# Patient Record
Sex: Female | Born: 1950
Health system: Southern US, Community
[De-identification: ages and names within clinical notes are randomized; demographics above are authoritative.]

## PROBLEM LIST (undated history)

## (undated) DIAGNOSIS — M1990 Unspecified osteoarthritis, unspecified site: Secondary | ICD-10-CM

## (undated) DIAGNOSIS — Z960 Presence of urogenital implants: Secondary | ICD-10-CM

## (undated) DIAGNOSIS — Z8659 Personal history of other mental and behavioral disorders: Secondary | ICD-10-CM

## (undated) DIAGNOSIS — K219 Gastro-esophageal reflux disease without esophagitis: Secondary | ICD-10-CM

## (undated) DIAGNOSIS — K449 Diaphragmatic hernia without obstruction or gangrene: Secondary | ICD-10-CM

## (undated) DIAGNOSIS — I491 Atrial premature depolarization: Secondary | ICD-10-CM

## (undated) DIAGNOSIS — I493 Ventricular premature depolarization: Secondary | ICD-10-CM

## (undated) DIAGNOSIS — Z973 Presence of spectacles and contact lenses: Secondary | ICD-10-CM

## (undated) DIAGNOSIS — R42 Dizziness and giddiness: Secondary | ICD-10-CM

## (undated) DIAGNOSIS — E119 Type 2 diabetes mellitus without complications: Secondary | ICD-10-CM

## (undated) DIAGNOSIS — D509 Iron deficiency anemia, unspecified: Secondary | ICD-10-CM

## (undated) DIAGNOSIS — Z87898 Personal history of other specified conditions: Secondary | ICD-10-CM

## (undated) DIAGNOSIS — R0602 Shortness of breath: Secondary | ICD-10-CM

## (undated) DIAGNOSIS — K08109 Complete loss of teeth, unspecified cause, unspecified class: Secondary | ICD-10-CM

## (undated) DIAGNOSIS — O009 Unspecified ectopic pregnancy without intrauterine pregnancy: Secondary | ICD-10-CM

## (undated) DIAGNOSIS — Z972 Presence of dental prosthetic device (complete) (partial): Secondary | ICD-10-CM

## (undated) DIAGNOSIS — F419 Anxiety disorder, unspecified: Secondary | ICD-10-CM

## (undated) DIAGNOSIS — N816 Rectocele: Secondary | ICD-10-CM

## (undated) DIAGNOSIS — I10 Essential (primary) hypertension: Principal | ICD-10-CM

## (undated) DIAGNOSIS — G4733 Obstructive sleep apnea (adult) (pediatric): Secondary | ICD-10-CM

## (undated) HISTORY — DX: Atrial premature depolarization: I49.1

## (undated) HISTORY — PX: TUBAL LIGATION: SHX77

## (undated) HISTORY — PX: OTHER SURGICAL HISTORY: SHX169

## (undated) HISTORY — DX: Dizziness and giddiness: R42

## (undated) HISTORY — PX: EXPLORATORY LAPAROTOMY: SUR591

## (undated) HISTORY — DX: Personal history of other specified conditions: Z87.898

## (undated) HISTORY — PX: TOE SURGERY: SHX1073

## (undated) HISTORY — DX: Shortness of breath: R06.02

## (undated) HISTORY — DX: Ventricular premature depolarization: I49.3

## (undated) HISTORY — DX: Essential (primary) hypertension: I10

## (undated) HISTORY — PX: BUNIONECTOMY: SHX129

---

## 1989-11-19 DIAGNOSIS — Z8759 Personal history of other complications of pregnancy, childbirth and the puerperium: Secondary | ICD-10-CM

## 1989-11-19 HISTORY — PX: ECTOPIC PREGNANCY SURGERY: SHX613

## 1989-11-19 HISTORY — DX: Personal history of other complications of pregnancy, childbirth and the puerperium: Z87.59

## 1998-07-18 ENCOUNTER — Emergency Department (HOSPITAL_COMMUNITY): Admission: EM | Admit: 1998-07-18 | Discharge: 1998-07-18 | Payer: Self-pay | Admitting: Emergency Medicine

## 1998-08-09 ENCOUNTER — Encounter: Payer: Self-pay | Admitting: Emergency Medicine

## 1998-08-09 ENCOUNTER — Emergency Department (HOSPITAL_COMMUNITY): Admission: EM | Admit: 1998-08-09 | Discharge: 1998-08-09 | Payer: Self-pay | Admitting: Emergency Medicine

## 1998-11-05 ENCOUNTER — Emergency Department (HOSPITAL_COMMUNITY): Admission: EM | Admit: 1998-11-05 | Discharge: 1998-11-05 | Payer: Self-pay | Admitting: Emergency Medicine

## 2000-11-19 DIAGNOSIS — E119 Type 2 diabetes mellitus without complications: Secondary | ICD-10-CM | POA: Insufficient documentation

## 2001-10-10 ENCOUNTER — Emergency Department (HOSPITAL_COMMUNITY): Admission: EM | Admit: 2001-10-10 | Discharge: 2001-10-10 | Payer: Self-pay | Admitting: Emergency Medicine

## 2001-10-10 ENCOUNTER — Encounter: Payer: Self-pay | Admitting: *Deleted

## 2001-10-10 ENCOUNTER — Encounter: Payer: Self-pay | Admitting: Emergency Medicine

## 2002-03-23 ENCOUNTER — Ambulatory Visit (HOSPITAL_COMMUNITY): Admission: RE | Admit: 2002-03-23 | Discharge: 2002-03-23 | Payer: Self-pay | Admitting: Family Medicine

## 2002-03-23 ENCOUNTER — Encounter: Payer: Self-pay | Admitting: Obstetrics & Gynecology

## 2003-02-02 ENCOUNTER — Emergency Department (HOSPITAL_COMMUNITY): Admission: EM | Admit: 2003-02-02 | Discharge: 2003-02-02 | Payer: Self-pay | Admitting: Emergency Medicine

## 2003-02-02 ENCOUNTER — Encounter: Payer: Self-pay | Admitting: Emergency Medicine

## 2003-02-12 ENCOUNTER — Emergency Department (HOSPITAL_COMMUNITY): Admission: EM | Admit: 2003-02-12 | Discharge: 2003-02-12 | Payer: Self-pay | Admitting: Emergency Medicine

## 2003-10-07 ENCOUNTER — Emergency Department (HOSPITAL_COMMUNITY): Admission: EM | Admit: 2003-10-07 | Discharge: 2003-10-07 | Payer: Self-pay | Admitting: Emergency Medicine

## 2006-03-20 ENCOUNTER — Encounter: Payer: Self-pay | Admitting: Emergency Medicine

## 2007-03-01 ENCOUNTER — Emergency Department (HOSPITAL_COMMUNITY): Admission: EM | Admit: 2007-03-01 | Discharge: 2007-03-01 | Payer: Self-pay | Admitting: Emergency Medicine

## 2008-02-06 ENCOUNTER — Emergency Department (HOSPITAL_COMMUNITY): Admission: EM | Admit: 2008-02-06 | Discharge: 2008-02-07 | Payer: Self-pay | Admitting: Emergency Medicine

## 2008-02-07 ENCOUNTER — Emergency Department (HOSPITAL_COMMUNITY): Admission: EM | Admit: 2008-02-07 | Discharge: 2008-02-07 | Payer: Self-pay | Admitting: Emergency Medicine

## 2008-08-11 ENCOUNTER — Emergency Department (HOSPITAL_COMMUNITY): Admission: EM | Admit: 2008-08-11 | Discharge: 2008-08-11 | Payer: Self-pay | Admitting: Family Medicine

## 2009-09-17 ENCOUNTER — Emergency Department (HOSPITAL_COMMUNITY): Admission: EM | Admit: 2009-09-17 | Discharge: 2009-09-17 | Payer: Self-pay | Admitting: Emergency Medicine

## 2010-02-28 ENCOUNTER — Emergency Department (HOSPITAL_COMMUNITY): Admission: EM | Admit: 2010-02-28 | Discharge: 2010-02-28 | Payer: Self-pay | Admitting: Family Medicine

## 2010-03-14 ENCOUNTER — Ambulatory Visit: Payer: Self-pay | Admitting: Nurse Practitioner

## 2010-03-14 DIAGNOSIS — M25559 Pain in unspecified hip: Secondary | ICD-10-CM | POA: Insufficient documentation

## 2010-03-14 LAB — CONVERTED CEMR LAB
ALT: 11 units/L (ref 0–35)
AST: 13 units/L (ref 0–37)
Albumin: 4.8 g/dL (ref 3.5–5.2)
Alkaline Phosphatase: 71 units/L (ref 39–117)
BUN: 15 mg/dL (ref 6–23)
Basophils Absolute: 0 10*3/uL (ref 0.0–0.1)
Basophils Relative: 0 % (ref 0–1)
Bilirubin Urine: NEGATIVE
Blood Glucose, Fingerstick: 335
Blood in Urine, dipstick: NEGATIVE
CO2: 15 meq/L — ABNORMAL LOW (ref 19–32)
CRP: 0.7 mg/dL — ABNORMAL HIGH (ref <0.6)
Calcium: 10 mg/dL (ref 8.4–10.5)
Chloride: 102 meq/L (ref 96–112)
Creatinine, Ser: 0.83 mg/dL (ref 0.40–1.20)
Eosinophils Absolute: 0.1 10*3/uL (ref 0.0–0.7)
Eosinophils Relative: 2 % (ref 0–5)
Glucose, Bld: 225 mg/dL — ABNORMAL HIGH (ref 70–99)
Glucose, Urine, Semiquant: >=1000
HCT: 36.9 % (ref 36.0–46.0)
Hemoglobin: 12.1 g/dL (ref 12.0–15.0)
Insulin: 20 microintl units/mL (ref 3–28)
Ketones, urine, test strip: NEGATIVE
Lymphocytes Relative: 46 % (ref 12–46)
Lymphs Abs: 4.2 10*3/uL — ABNORMAL HIGH (ref 0.7–4.0)
MCHC: 32.8 g/dL (ref 30.0–36.0)
MCV: 86.4 fL (ref 78.0–100.0)
Microalb, Ur: 1.78 mg/dL (ref 0.00–1.89)
Monocytes Absolute: 0.4 10*3/uL (ref 0.1–1.0)
Monocytes Relative: 5 % (ref 3–12)
Neutro Abs: 4.3 10*3/uL (ref 1.7–7.7)
Neutrophils Relative %: 48 % (ref 43–77)
Nitrite: NEGATIVE
Platelets: 315 10*3/uL (ref 150–400)
Potassium: 4.5 meq/L (ref 3.5–5.3)
RBC: 4.27 M/uL (ref 3.87–5.11)
RDW: 13.9 % (ref 11.5–15.5)
Rapid HIV Screen: NEGATIVE
Sodium: 137 meq/L (ref 135–145)
Specific Gravity, Urine: >=1.03
TSH: 3.167 microintl units/mL (ref 0.350–4.500)
Total Bilirubin: 0.3 mg/dL (ref 0.3–1.2)
Total Protein: 8.2 g/dL (ref 6.0–8.3)
Urobilinogen, UA: 0.2
WBC Urine, dipstick: NEGATIVE
WBC: 9 10*3/uL (ref 4.0–10.5)
pH: 5.5

## 2010-03-20 ENCOUNTER — Ambulatory Visit (HOSPITAL_COMMUNITY): Admission: RE | Admit: 2010-03-20 | Discharge: 2010-03-20 | Payer: Self-pay | Admitting: Rheumatology

## 2010-03-20 ENCOUNTER — Ambulatory Visit: Payer: Self-pay | Admitting: Nurse Practitioner

## 2010-03-20 DIAGNOSIS — R109 Unspecified abdominal pain: Secondary | ICD-10-CM | POA: Insufficient documentation

## 2010-03-20 LAB — CONVERTED CEMR LAB
Bilirubin Urine: NEGATIVE
Blood Glucose, Fingerstick: 122
Blood in Urine, dipstick: NEGATIVE
Glucose, Urine, Semiquant: NEGATIVE
Nitrite: NEGATIVE
Specific Gravity, Urine: >=1.03
Urobilinogen, UA: 0.2
WBC Urine, dipstick: NEGATIVE
pH: 5

## 2010-03-21 ENCOUNTER — Encounter (INDEPENDENT_AMBULATORY_CARE_PROVIDER_SITE_OTHER): Payer: Self-pay | Admitting: Nurse Practitioner

## 2010-03-21 ENCOUNTER — Ambulatory Visit: Payer: Self-pay | Admitting: Internal Medicine

## 2010-03-22 DIAGNOSIS — K5900 Constipation, unspecified: Secondary | ICD-10-CM | POA: Insufficient documentation

## 2010-03-31 ENCOUNTER — Observation Stay (HOSPITAL_COMMUNITY): Admission: EM | Admit: 2010-03-31 | Discharge: 2010-03-31 | Payer: Self-pay | Admitting: Emergency Medicine

## 2010-03-31 ENCOUNTER — Ambulatory Visit: Payer: Self-pay | Admitting: Cardiovascular Disease

## 2010-03-31 ENCOUNTER — Encounter (INDEPENDENT_AMBULATORY_CARE_PROVIDER_SITE_OTHER): Payer: Self-pay | Admitting: Emergency Medicine

## 2010-04-06 ENCOUNTER — Ambulatory Visit: Payer: Self-pay | Admitting: Nurse Practitioner

## 2010-04-06 DIAGNOSIS — M21619 Bunion of unspecified foot: Secondary | ICD-10-CM | POA: Insufficient documentation

## 2010-04-06 LAB — CONVERTED CEMR LAB: Blood Glucose, Fingerstick: 188

## 2010-04-07 ENCOUNTER — Telehealth (INDEPENDENT_AMBULATORY_CARE_PROVIDER_SITE_OTHER): Payer: Self-pay | Admitting: Nurse Practitioner

## 2010-04-08 ENCOUNTER — Emergency Department (HOSPITAL_COMMUNITY): Admission: EM | Admit: 2010-04-08 | Discharge: 2010-04-09 | Payer: Self-pay | Admitting: Emergency Medicine

## 2010-04-10 ENCOUNTER — Encounter (INDEPENDENT_AMBULATORY_CARE_PROVIDER_SITE_OTHER): Payer: Self-pay | Admitting: Nurse Practitioner

## 2010-04-10 ENCOUNTER — Ambulatory Visit: Payer: Self-pay | Admitting: Internal Medicine

## 2010-05-17 ENCOUNTER — Telehealth (INDEPENDENT_AMBULATORY_CARE_PROVIDER_SITE_OTHER): Payer: Self-pay | Admitting: Nurse Practitioner

## 2010-07-05 ENCOUNTER — Telehealth (INDEPENDENT_AMBULATORY_CARE_PROVIDER_SITE_OTHER): Payer: Self-pay | Admitting: Nurse Practitioner

## 2010-07-05 ENCOUNTER — Ambulatory Visit: Payer: Self-pay | Admitting: Nurse Practitioner

## 2010-07-05 DIAGNOSIS — Z780 Asymptomatic menopausal state: Secondary | ICD-10-CM | POA: Insufficient documentation

## 2010-07-06 ENCOUNTER — Encounter (INDEPENDENT_AMBULATORY_CARE_PROVIDER_SITE_OTHER): Payer: Self-pay | Admitting: Nurse Practitioner

## 2010-07-06 DIAGNOSIS — E785 Hyperlipidemia, unspecified: Secondary | ICD-10-CM | POA: Insufficient documentation

## 2010-07-07 ENCOUNTER — Encounter (INDEPENDENT_AMBULATORY_CARE_PROVIDER_SITE_OTHER): Payer: Self-pay | Admitting: Nurse Practitioner

## 2010-07-12 ENCOUNTER — Encounter (INDEPENDENT_AMBULATORY_CARE_PROVIDER_SITE_OTHER): Payer: Self-pay | Admitting: Nurse Practitioner

## 2010-07-13 ENCOUNTER — Telehealth (INDEPENDENT_AMBULATORY_CARE_PROVIDER_SITE_OTHER): Payer: Self-pay | Admitting: Nurse Practitioner

## 2010-07-17 ENCOUNTER — Telehealth (INDEPENDENT_AMBULATORY_CARE_PROVIDER_SITE_OTHER): Payer: Self-pay | Admitting: Nurse Practitioner

## 2010-07-28 ENCOUNTER — Encounter (INDEPENDENT_AMBULATORY_CARE_PROVIDER_SITE_OTHER): Payer: Self-pay | Admitting: Nurse Practitioner

## 2010-07-28 ENCOUNTER — Ambulatory Visit (HOSPITAL_COMMUNITY)
Admission: RE | Admit: 2010-07-28 | Discharge: 2010-07-28 | Payer: Self-pay | Source: Home / Self Care | Admitting: Family Medicine

## 2010-08-28 ENCOUNTER — Encounter (INDEPENDENT_AMBULATORY_CARE_PROVIDER_SITE_OTHER): Payer: Self-pay | Admitting: Nurse Practitioner

## 2010-09-14 ENCOUNTER — Ambulatory Visit: Payer: Self-pay | Admitting: Nurse Practitioner

## 2010-09-14 LAB — CONVERTED CEMR LAB
ALT: 10 units/L (ref 0–35)
AST: 21 units/L (ref 0–37)
Albumin: 5 g/dL (ref 3.5–5.2)
Alkaline Phosphatase: 58 units/L (ref 39–117)
Bilirubin, Direct: 0.1 mg/dL (ref 0.0–0.3)
Cholesterol: 228 mg/dL — ABNORMAL HIGH (ref 0–200)
HDL: 44 mg/dL (ref >39)
Indirect Bilirubin: 0.4 mg/dL (ref 0.0–0.9)
LDL Cholesterol: 163 mg/dL — ABNORMAL HIGH (ref 0–99)
Total Bilirubin: 0.5 mg/dL (ref 0.3–1.2)
Total CHOL/HDL Ratio: 5.2
Total Protein: 8 g/dL (ref 6.0–8.3)
Triglycerides: 105 mg/dL (ref <150)
VLDL: 21 mg/dL (ref 0–40)

## 2010-09-16 ENCOUNTER — Telehealth (INDEPENDENT_AMBULATORY_CARE_PROVIDER_SITE_OTHER): Payer: Self-pay | Admitting: Nurse Practitioner

## 2010-09-16 ENCOUNTER — Encounter (INDEPENDENT_AMBULATORY_CARE_PROVIDER_SITE_OTHER): Payer: Self-pay | Admitting: Nurse Practitioner

## 2010-11-24 ENCOUNTER — Emergency Department (HOSPITAL_COMMUNITY)
Admission: EM | Admit: 2010-11-24 | Discharge: 2010-11-24 | Payer: Self-pay | Source: Home / Self Care | Admitting: Emergency Medicine

## 2010-11-24 LAB — URINALYSIS, ROUTINE W REFLEX MICROSCOPIC
Bilirubin Urine: NEGATIVE
Hemoglobin, Urine: NEGATIVE
Ketones, ur: NEGATIVE mg/dL
Nitrite: NEGATIVE
Protein, ur: NEGATIVE mg/dL
Specific Gravity, Urine: 1.015 (ref 1.005–1.030)
Urine Glucose, Fasting: NEGATIVE mg/dL
Urobilinogen, UA: 0.2 mg/dL (ref 0.0–1.0)
pH: 6.5 (ref 5.0–8.0)

## 2010-11-24 LAB — BASIC METABOLIC PANEL
BUN: 15 mg/dL (ref 6–23)
CO2: 23 mEq/L (ref 19–32)
Calcium: 9.3 mg/dL (ref 8.4–10.5)
Chloride: 107 mEq/L (ref 96–112)
Creatinine, Ser: 0.8 mg/dL (ref 0.4–1.2)
GFR calc Af Amer: >60 mL/min (ref >60)
GFR calc non Af Amer: >60 mL/min (ref >60)
Glucose, Bld: 151 mg/dL — ABNORMAL HIGH (ref 70–99)
Potassium: 3.8 mEq/L (ref 3.5–5.1)
Sodium: 140 mEq/L (ref 135–145)

## 2010-11-24 LAB — MAGNESIUM: Magnesium: 2 mg/dL (ref 1.5–2.5)

## 2010-11-24 LAB — CK: Total CK: 167 U/L (ref 7–177)

## 2010-11-24 LAB — GLUCOSE, CAPILLARY: Glucose-Capillary: 141 mg/dL — ABNORMAL HIGH (ref 70–99)

## 2010-11-24 LAB — CALCIUM, IONIZED: Calcium, Ion: 0.72 mmol/L — CL (ref 1.12–1.32)

## 2010-12-17 LAB — CONVERTED CEMR LAB
BUN: 13 mg/dL (ref 6–23)
Bilirubin Urine: NEGATIVE
Blood Glucose, Fingerstick: 219
Blood in Urine, dipstick: NEGATIVE
CO2: 26 meq/L (ref 19–32)
Calcium: 9.7 mg/dL (ref 8.4–10.5)
Chloride: 104 meq/L (ref 96–112)
Cholesterol: 258 mg/dL — ABNORMAL HIGH (ref 0–200)
Creatinine, Ser: 0.84 mg/dL (ref 0.40–1.20)
Glucose, Bld: 132 mg/dL — ABNORMAL HIGH (ref 70–99)
Glucose, Urine, Semiquant: 250
HDL: 38 mg/dL — ABNORMAL LOW (ref >39)
Hgb A1c MFr Bld: 8.1 %
Ketones, urine, test strip: NEGATIVE
LDL Cholesterol: 173 mg/dL — ABNORMAL HIGH (ref 0–99)
Nitrite: NEGATIVE
OCCULT 1: NEGATIVE
Pap Smear: NEGATIVE
Potassium: 4 meq/L (ref 3.5–5.3)
Protein, U semiquant: NEGATIVE
Sodium: 139 meq/L (ref 135–145)
Specific Gravity, Urine: >=1.03
Total CHOL/HDL Ratio: 6.8
Triglycerides: 237 mg/dL — ABNORMAL HIGH (ref <150)
Urobilinogen, UA: 0.2
VLDL: 47 mg/dL — ABNORMAL HIGH (ref 0–40)
WBC Urine, dipstick: NEGATIVE
pH: 5

## 2010-12-19 NOTE — Progress Notes (Signed)
Summary: Wanting f/u lab appt.   Phone Note Call from Patient   Caller: Patient Reason for Call: Talk to Doctor Summary of Call: PT WANTS TO KNOW . WHAT TIME  SHE CAN TAKE NEUROTIN . PLEASE, CALL HER @ [phone]  THAN K YOU  Initial call taken by: Cheryll Dessert,  July 13, 2010 11:13 AM  Follow-up for Phone Call        Pt. states that she started taking the pravastatin last night; appt. for f/u FLP and LFTs made for 09/08/10.  Denied query for neurontin, just wanted to make appt.  Follow-up by: Dutch Quint RN,  July 13, 2010 11:47 AM

## 2010-12-19 NOTE — Progress Notes (Signed)
Summary: Lab and Bone Density results  Phone Note Outgoing Call   Summary of Call: notify pt that her cholesterol was improved from previous check but still not at goal. will increase pravachol to 40mg  by mouth night (new Rx sent to [facility]) Also her bone density done in September shows that she has osteopenia (softening of bones) in left hip and lower back she should take calcium + vitamin D supplement twice daily (sent to [facility] pharmacy) she can also try glucosamine supplement twice per day (she can purchase this OTC)  There is some evidence that the glucoasamine will help some with joint pain Initial call taken by: Lehman Prom FNP,  September 16, 2010 4:59 PM  Follow-up for Phone Call        left message on machine for pt to return call to the office. Follow-up by: [name],  September 19, 2010 2:58 PM  Additional Follow-up for Phone Call Additional follow up Details #1::        pt informed of above information. Additional Follow-up by: [name],  September 20, 2010 9:00 AM    New/Updated Medications: PRAVASTATIN SODIUM 40 MG TABS (PRAVASTATIN SODIUM) One tablet by mouth nightly for cholesterol  *Note change in dose** CALCIUM-D 600-200 MG-UNIT TABS (CALCIUM CARBONATE-VITAMIN D) One tablet by mouth two times a day for bones Prescriptions: CALCIUM-D 600-200 MG-UNIT TABS (CALCIUM CARBONATE-VITAMIN D) One tablet by mouth two times a day for bones  #60 x 11   Entered and Authorized by:   Lehman Prom FNP   Signed by:   Lehman Prom FNP on 09/16/2010   Method used:   Faxed to ...       [facility] (retail)       [address] [place], [place]  [address]       Ph: [phone]       Fax: [phone]   RxID:   [id number] PRAVASTATIN SODIUM 40 MG TABS (PRAVASTATIN SODIUM) One tablet by mouth nightly for cholesterol  *Note change in dose**  #30 x 5   Entered and Authorized by:   Lehman Prom FNP  Signed by:   Lehman Prom FNP on 09/16/2010   Method used:   Faxed to ...       [facility] (retail)       [address] [place], [place]  [address]       Ph: [phone]       Fax: [phone]   RxID:   [id number]    Bone Density  Procedure date:  07/28/2010  Findings:       Lumbar Spine:  T Score-2.5 to -1.0 Spine.  Right  Hip Total: T Score > -1.0 Hip.  Left Hip Total: T Score -2.5 to -1.0 Hip.      Comments:       Assessment:  Osteopenia.     Procedures Next Due Date:    Bone Density: 07/2012   Bone Density  Procedure date:  07/28/2010  Findings:       Lumbar Spine:  T Score-2.5 to -1.0 Spine.  Right  Hip Total: T Score > -1.0 Hip.  Left Hip Total: T Score -2.5 to -1.0 Hip.      Comments:       Assessment:  Osteopenia.     Procedures Next Due Date:    Bone Density: 07/2012

## 2010-12-19 NOTE — Miscellaneous (Signed)
Summary: Mammogram Results  Clinical Lists Changes  Observations: Added new observation of MAMMO DUE: 07/2011 (08/28/2010 19:27) Added new observation of MAMMRECACT: Screening mammogram in 1 year.    (07/28/2010 19:27) Added new observation of MAMMOGRAM: No specific mammographic evidence of malignancy.  Assessment: BIRADS 1.  (07/28/2010 19:27)      Mammogram  Procedure date:  07/28/2010  Findings:      No specific mammographic evidence of malignancy.  Assessment: BIRADS 1.   Comments:      Screening mammogram in 1 year.     Procedures Next Due Date:    Mammogram: 07/2011   Mammogram  Procedure date:  07/28/2010  Findings:      No specific mammographic evidence of malignancy.  Assessment: BIRADS 1.   Comments:      Screening mammogram in 1 year.     Procedures Next Due Date:    Mammogram: 07/2011

## 2010-12-19 NOTE — Progress Notes (Signed)
Summary: Office Visit//DEPRESSION SCREENING  Office Visit//DEPRESSION SCREENING   Imported By: Arta Bruce 07/06/2010 09:56:02  _____________________________________________________________________  External Attachment:    Type:   Image     Comment:   External Document

## 2010-12-19 NOTE — Assessment & Plan Note (Signed)
Summary: New - Establish Care   Vital Signs:  Patient profile:   60 year old female Menstrual status:  postmenopausal Height:      62 inches Weight:      139.3 pounds BMI:     25.57 BSA:     1.64 Temp:     98.0 degrees F oral Pulse rate:   84 / minute Pulse rhythm:   regular Resp:     20 per minute BP sitting:   136 / 82  (left arm) Cuff size:   regular  Vitals Entered By: [name] (March 14, 2010 11:20 AM) CC: pain all over body,left hip bone and muscle, right breast pain weakness x 1 month  Is Patient Diabetic? Yes Pain Assessment Patient in pain? yes     Location: hip,breast, back, muscle Intensity: 7 Type: burning, sharp Onset of pain  Intermittent CBG Result 335 CBG Device ID B  Does patient need assistance? Functional Status Self care Ambulation Normal  Vision Screening:      Vision Comments: 12/2010     Menstrual Status postmenopausal   CC:  pain all over body, left hip bone and muscle, and right breast pain weakness x 1 month .  History of Present Illness:  Pt into the office to establish care. No previous PCP in over 3 years.   Diabetes - Dx 2002 but pt has been controlled by diet over the years.  She has not been followed by PCP in over 3 years so she has no idea how her medications are doing. She does not have a meter and has not been checking her blood sugar at home.  Urgent care visit on 02/28/2010 for pelvic pain. (full d/c report reviewed) x-ray done on 03/01/2010 was normal except for minimal degenerative changes Pt was ordered diclofenac and she stopped taking the medications after 8 doses due to chest pain. she stopped taking the medication and the chest pain resolved.  But of course the hip pain persisted after the medications     Diabetes Management History:      The patient is a 60 years old female who comes in for evaluation of DM Type 2.  She has not been enrolled in the "Diabetic Education Program".  She states lack of  understanding of dietary principles and is not following her diet appropriately.  No sensory loss is reported.  Self foot exams are not being performed.  She is not checking home blood sugars.        Hypoglycemic symptoms are not occurring.  Hyperglycemic symptoms include polyuria and polydipsia.        Symptoms which suggest diabetic complications include vision problems.    Habits & Providers  Alcohol-Tobacco-Diet     Alcohol drinks/day: 0     Tobacco Status: never  Exercise-Depression-Behavior     Drug Use: never  Medications Prior to Update: 1)  None  Allergies (verified): No Known Drug Allergies  Past History:  Past Surgical History:  tubal pregnancy 65  Family History:  mother - diabetes and CVA (deceased) older sister - thyroid problems  Social History: 3 children married tobacco - never ETOH - never DRug - neverSmoking Status:  never Drug Use:  never  Review of Systems General:  Complains of weakness. Resp:  Denies cough. GI:  Denies abdominal pain, nausea, and vomiting. MS:  Complains of joint pain; left hip and lower back. Neuro:  Denies tingling. Endo:  Complains of excessive thirst and excessive urination; denies excessive hunger.  Physical Exam  General:  alert.   Head:  normocephalic.   Lungs:  normal breath sounds.   Heart:  normal rate and regular rhythm.   Abdomen:  normal bowel sounds.   Neurologic:  alert & oriented X3.   Skin:  color normal.   Psych:  Oriented X3.    Diabetes Management Exam:    Eye Exam:       Eye Exam done elsewhere          Date: 12/20/2009          Results: wears glasses          Done by: optho  Hip Exam  Hip Exam:    Left:    Inspection:  Normal    Palpation:  Normal    Stability:  stable    Tenderness:  no    Swelling:  no    Erythema:  no    Impression & Recommendations:  Problem # 1:  DIABETES MELLITUS, TYPE II (ICD-250.00) uncontrolled will start on meds order glucometer to check BS daily   will order pt to see Susie Piper Orders: Capillary Blood Glucose/CBG ([address]) Hemoglobin A1C ([address]) Rapid HIV  ([address]) Diabetic Clinic Referral (Diabetic) T-Urine Microalbumin w/creat. ratio [phone]) T-Comprehensive Metabolic Panel [phone]) T-CBC w/Diff [phone]) T-TSH [phone]) T-CRP (C-Reactive Protein) ([address]) T-Insulin, Random [phone])  Her updated medication list for this problem includes:    Metformin Hcl 500 Mg Tabs (Metformin hcl) ..... One tablet by mouth two times a day for 1 week then increase to two tablets by mouth two times a day for diabetes  Problem # 2:  HIP PAIN, LEFT (ICD-719.45)  Her updated medication list for this problem includes:    Ibuprofen 600 Mg Tabs (Ibuprofen) ..... One tablet by mouth two times a day as needed for pain  Complete Medication List: 1)  Metformin Hcl 500 Mg Tabs (Metformin hcl) .... One tablet by mouth two times a day for 1 week then increase to two tablets by mouth two times a day for diabetes 2)  Ibuprofen 600 Mg Tabs (Ibuprofen) .... One tablet by mouth two times a day as needed for pain 3)  Blood Glucose Meter Kit (Blood glucose monitoring suppl) .... Use to check blood sugar daily before meals 4)  Blood Glucose Test Strp (Glucose blood) .... Use to check blood sugar daily before meals  Patient Instructions: 1)  Diabetes - very uncontrolled 2)  start metformin  3)  Check blood sugar daily before breakfast 4)  Schedule an appointment with susie piper - diabetes educator 5)  Follow up in 2 weeks with n.martin for diabetes 6)  will need u/a, cbg, foot check 7)  Bring blood sugar log with you Prescriptions: BLOOD GLUCOSE TEST  STRP (GLUCOSE BLOOD) Use to check blood sugar daily before meals  #100 x 5   Entered and Authorized by:   Lehman Prom FNP   Signed by:   Lehman Prom FNP on 03/14/2010   Method used:   Print then Give to Patient   RxID:   [id number] BLOOD GLUCOSE METER  KIT (BLOOD  GLUCOSE MONITORING SUPPL) Use to check blood sugar daily before meals  #32meter x 0   Entered and Authorized by:   Lehman Prom FNP   Signed by:   Lehman Prom FNP on 03/14/2010   Method used:   Print then Give to Patient   RxID:   [id number] IBUPROFEN 600 MG TABS (IBUPROFEN) One tablet by mouth two times a day as  needed for pain  #60 x 0   Entered and Authorized by:   Lehman Prom FNP   Signed by:   Lehman Prom FNP on 03/14/2010   Method used:   Print then Give to Patient   RxID:   [id number] METFORMIN HCL 500 MG TABS (METFORMIN HCL) One tablet by mouth two times a day for 1 week then increase to Two tablets by mouth two times a day for diabetes  #120 x 1   Entered and Authorized by:   Lehman Prom FNP   Signed by:   Lehman Prom FNP on 03/14/2010   Method used:   Print then Give to Patient   RxID:   [id number]   Laboratory Results   Urine Tests  Date/Time Received: March 14, 2010 11:39 AM   Routine Urinalysis   Color: yellow Glucose: >=1000   (Normal Range: Negative) Bilirubin: negative   (Normal Range: Negative) Ketone: negative   (Normal Range: Negative) Spec. Gravity: >=1.030   (Normal Range: 1.003-1.035) Blood: negative   (Normal Range: Negative) pH: 5.5   (Normal Range: 5.0-8.0) Protein: trace   (Normal Range: Negative) Urobilinogen: 0.2   (Normal Range: 0-1) Nitrite: negative   (Normal Range: Negative) Leukocyte Esterace: negative   (Normal Range: Negative)     Blood Tests     CBG Random:: 335  Date/Time Received: March 14, 2010 1:21 PM  Date/Time Reported: March 14, 2010 1:21 PM   Other Tests  Rapid HIV: negative    X-ray  Procedure date:  02/28/2010  Findings:      pelvic - no acute bony abnormality. Mild degenerative change in the hips. Oval calcification in the right bony pelvis may represent a phlebolith or possibly enterolith   X-ray  Procedure date:  02/28/2010  Findings:      pelvic - no acute bony  abnormality. Mild degenerative change in the hips. Oval calcification in the right bony pelvis may represent a phlebolith or possibly enterolith

## 2010-12-19 NOTE — Progress Notes (Signed)
Summary: Diretions for Prednisone  Phone Note Call from Patient   Summary of Call: MARTIN PRESCRIBED PRESISONE .//PT HAS QUESTION ON HOW TO TAKE IT //PLEASE Michelle Robertson Initial call taken by: Arta Bruce,  Apr 07, 2010 10:41 AM  Follow-up for Phone Call        [phone] phone not working. Left message with female @ work # to return our call.  [name] RN  Apr 07, 2010 3:11 PM   Additional Follow-up for Phone Call Additional follow up Details #1::        ??? Work # - pt works from home see pt instructions from last office visit I wrote out EXACTLY how to take medications. If pt did not get medications until a day following what was indicated on the paper then she just changes the days Additional Follow-up by: Lehman Prom FNP,  Apr 10, 2010 2:14 PM    Additional Follow-up for Phone Call Additional follow up Details #2::    [name] CALLED TO LET YOU KNOW ABOUT THE PREDNISONE. SHE SAYS THAT THIS PAST FRIDAY, SHE TOOK HER FIRST DOES, AND SATURDAY SHE TOO 30MG , AND SINCE THE SATURDAY DOSE, SHE HASN'T HAD ANY .[name]  Apr 10, 2010 2:22 PM  Spoke with pt. and discussed Prednisone.  She states that she took her first dose as ordered on Saturday; approximately 1 1/2-2 hrs after she took it, she had severe palpitations and CP, requiring EMS to transport her to hospital.  Her pulse increased from 110-120.    She has taken no further dosage since Saturday and wanted to know if the Prednisone was related to her symptoms and what she should do?   Follow-up by: Dutch Quint RN,  Apr 12, 2010 12:50 PM  Additional Follow-up for Phone Call Additional follow up Details #3:: Details for Additional Follow-up Action Taken: ?? What were the EMS findings? I would suggest that pt try to take only 10mg  by mouth daily of the medication until it completes instead of the taper as previously discussed n.martin,fnp Apr 12, 2010 1:14 PM  [name]  Apr 14, 2010 4:18 PM  pt informed of taking the 10 mg by mouth daily.  Will call if she has any complications.

## 2010-12-19 NOTE — Progress Notes (Signed)
Summary: QUESTION -MED  Phone Note Call from Patient   Caller: Patient Reason for Call: Talk to Nurse Summary of Call: PT START USING NEUROTIN AND THE PAIN IS NOT GOING AWAY STILL TENDER AND SHE WANTS TO KNOW IF KEEP TAKING THE MED . Fuller Song HER  [phone]  THANK YOU   Initial call taken by: Cheryll Dessert,  July 17, 2010 11:06 AM  Follow-up for Phone Call        forward to N. Daphine Deutscher, fnp Follow-up by: [name],  July 17, 2010 11:36 AM  Additional Follow-up for Phone Call Additional follow up Details #1::        That is the starting dose of neurontin 300mg  - she can increase to 2 tablets by mouth nighty but this may increase sedation Additional Follow-up by: Lehman Prom FNP,  July 17, 2010 11:52 AM    Additional Follow-up for Phone Call Additional follow up Details #2::    called [phone] left message on machine fr pt to return call to the office. [name]  July 17, 2010 4:51 PM  [name]  July 18, 2010 12:07 PM pt informedof informatin  Follow-up by: [name],  July 18, 2010 12:08 PM

## 2010-12-19 NOTE — Assessment & Plan Note (Signed)
Summary: Complete Physical Exam   Vital Signs:  Patient profile:   60 year old female Menstrual status:  postmenopausal Height:      62 inches Weight:      136 pounds BMI:     24.96 Temp:     97.8 degrees F oral Pulse rate:   82 / minute Pulse rhythm:   regular Resp:     16 per minute BP sitting:   117 / 44  (left arm) Cuff size:   regular  Vitals Entered By: Armenia Shannon (July 05, 2010 10:33 AM) CC: pt would like for you to look at right foot ... CBG Result 219  Does patient need assistance? Functional Status Self care Ambulation Normal  Vision Screening:      Vision Comments: 12/2010   CC:  pt would like for you to look at right foot ....  History of Present Illness:  Pt into the office for a complete physical exam  PAP - last pap done several years ago.   postmenopausal at age in early 36's  Mammogram - last mammogram in 2003 - no family hx of breast cancer no self breast exams at home her last mammogram was a bad exerience so pt never had another exam  Colonscopy - never no family hx of colon cancer no blood is stool  Social - married with 3 children  Diabetes Management History:      The patient is a 60 years old female who comes in for evaluation of DM Type 2.  She has not been enrolled in the "Diabetic Education Program".  She states understanding of dietary principles and is following her diet appropriately.  No sensory loss is reported.  Self foot exams are not being performed.  She is checking home blood sugars.  She says that she is not exercising regularly.        Hypoglycemic symptoms are not occurring.  No hyperglycemic symptoms are reported.  Other comments include: pt did not take her metformin for about one month b/c she was out of town.  She restarted back on it in 1 month.        There are no symptoms to suggest diabetic complications.  The following changes have been made to her treatment plan since last visit: medication changes.   Treatment plan changes were initiated by patient.    Habits & Providers  Alcohol-Tobacco-Diet     Alcohol drinks/day: 0     Tobacco Status: never  Exercise-Depression-Behavior     Does Patient Exercise: no     Drug Use: never  Comments: PHQ-9 score = 12  Current Medications (verified): 1)  Metformin Hcl 500 Mg Tabs (Metformin Hcl) .... One Tablet By Mouth Two Times A Day For 1 Week Then Increase To Two Tablets By Mouth Two Times A Day For Diabetes 2)  Ibuprofen 600 Mg Tabs (Ibuprofen) .... One Tablet By Mouth Two Times A Day As Needed For Pain 3)  Blood Glucose Meter  Kit (Blood Glucose Monitoring Suppl) .... Use To Check Blood Sugar Daily Before Meals 4)  Blood Glucose Test  Strp (Glucose Blood) .... Use To Check Blood Sugar Daily Before Meals 5)  Neurontin 300 Mg Caps (Gabapentin) .... One Capsule By Mouth Nightly 6)  Prednisone 10 Mg Tabs (Prednisone) .... 30mg  X 2 Days, 20mg  X 2 Days, 10mg  X 2 Days For Inflammation  Allergies (verified): 1)  ! * Diclofenac  Social History: Does Patient Exercise:  no  Review of Systems General:  Denies fever. Eyes:  Denies discharge. ENT:  Denies earache. CV:  Denies chest pain or discomfort. Resp:  Denies cough. GI:  Denies abdominal pain, nausea, and vomiting. GU:  Denies discharge. MS:  Complains of muscle; left leg and thigh pain. Derm:  Denies rash. Neuro:  Denies headaches. Psych:  Denies depression.  Physical Exam  General:  alert.   Head:  normocephalic.   Eyes:  vision grossly intact, pupils equal, and pupils round.   Ears:  bil TM with bony landmarks preset Nose:  no nasal discharge.   Mouth:  pharynx pink and moist.   Neck:  supple.   Chest Wall:  no mass.   Breasts:  right breast - dense tissue left breast - no lumps noted Lungs:  normal breath sounds.   Heart:  normal rate and regular rhythm.   Abdomen:  soft, non-tender, and normal bowel sounds.   Rectal:  no external abnormalities.   Pulses:  R radial normal  and L radial normal.   Extremities:  no edema Neurologic:  alert & oriented X3.   Skin:  color normal.   Psych:  Oriented X3.    Pelvic Exam  Vulva:      normal appearance.   Urethra and Bladder:      Urethra--normal.   Vagina:      physiologic discharge.   Cervix:      midposition.   Uterus:      smooth.   Adnexa:      nontender bilaterally.   Rectum:      heme negative stool.    Diabetes Management Exam:    Foot Exam (with socks and/or shoes not present):       Sensory-Monofilament:          Left foot: normal          Right foot: normal    Eye Exam:       Eye Exam done elsewhere          Date: 12/21/2009          Results: needs glasses          Done by: [facility]    Impression & Recommendations:  Problem # 1:  ROUTINE GYNECOLOGICAL EXAMINATION (ICD-V72.31) labs done  PAP done recent eye exam  guaiac cards x 3 given to pt; colonscopy recs given to pt bony density scheduled tdap given  PHQ-9 score = 12 Orders: KOH/ WET [place] [id number]) Pap Smear, Thin Prep ( Collection of) (U0454) Hemoccult Guaiac-1 spec.(in office) (82270)  Problem # 2:  UNSPECIFIED BREAST SCREENING (ICD-V76.10) self breast handout given mammogram ordered Orders: Mammogram (Screening) (Mammo)  Problem # 3:  DIABETES MELLITUS, TYPE II (ICD-250.00) still uncontrolled and pt admits that she is not taking her meds as ordered advised pt to take meds as ordered Her updated medication list for this problem includes:    Metformin Hcl 500 Mg Tabs (Metformin hcl) ..... One tablet by mouth two times a day for 1 week then increase to two tablets by mouth two times a day for diabetes  Orders: UA Dipstick w/o Micro (manual) (09811) T-Lipid Profile (91478-29562) T-Basic Metabolic Panel (13086-57846) EKG w/ Interpretation (93000) Capillary Blood Glucose/CBG (82948) Hgb A1C (96295MW)  Complete Medication List: 1)  Metformin Hcl 500 Mg Tabs (Metformin hcl) .... One tablet by mouth two times a  day for 1 week then increase to two tablets by mouth two times a day for diabetes 2)  Ibuprofen 600 Mg Tabs (Ibuprofen) .... One  tablet by mouth two times a day as needed for pain 3)  Blood Glucose Meter Kit (Blood glucose monitoring suppl) .... Use to check blood sugar daily before meals 4)  Blood Glucose Test Strp (Glucose blood) .... Use to check blood sugar daily before meals 5)  Neurontin 300 Mg Caps (Gabapentin) .... One capsule by mouth nightly  Other Orders: Tdap => 4yrs IM ([address]) Admin 1st Vaccine (60454) Admin 1st Vaccine (State) [phone]) Dexa scan (Dexa scan)  Diabetes Management Assessment/Plan:      Her blood pressure goal is < 130/80.    Patient Instructions: 1)  Tetanus will be good for 10 years. 2)  Diabetes - Hgba1c = 8.1 which is still uncontrolled.   3)  You admitted that you have not take your medications for 1 month.  Also you have sugar in your urine today which also lets me know that your diabetes is still not under good control.  Remember uncontrolled diabetes can cause problems with circulation, kidneys, and eyes 4)  Keep appointment for mammogram and bone density 5)  Bring stool cards back when complete - get instructions on how to do these before you leave 6)  Follow up in 6 weeks for diabetes. 7)  will need cbg, u/a.        Diabetic Foot Exam    10-g (5.07) Semmes-Weinstein Monofilament Test           Right Foot          Left Foot Visual Inspection               Test Control      normal         normal Site 1         normal         normal Site 2         normal         normal Site 3         normal         normal Site 4         normal         normal Site 5         normal         normal Site 6         normal         normal Site 7         normal         normal Site 8         normal         normal Site 9         normal         normal Site 10         normal         normal  Impression      normal         normal   Laboratory Results   Urine  Tests    Routine Urinalysis   Glucose: 250   (Normal Range: Negative) Bilirubin: negative   (Normal Range: Negative) Ketone: negative   (Normal Range: Negative) Spec. Gravity: >=1.030   (Normal Range: 1.003-1.035) Blood: negative   (Normal Range: Negative) pH: 5.0   (Normal Range: 5.0-8.0) Protein: negative   (Normal Range: Negative) Urobilinogen: 0.2   (Normal Range: 0-1) Nitrite: negative   (Normal Range: Negative) Leukocyte Esterace: negative   (Normal Range: Negative)     Blood  Tests     HGBA1C: 8.1%   (Normal Range: Non-Diabetic - 3-6%   Control Diabetic - 6-8%) CBG Random:: 219mg /dL  Date/Time Received: July 05, 2010   Stool - Occult Blood Hemmoccult #1: negative Date: 07/05/2010 Comments: wet prep not reviewed before discarded     Osteoporosis  Patient complains of: kyphosis No back pain No prior fracture No height loss No  Patient reports: Personal history of fracture No Hx of Fx in a 1st degree relative No Caucasian/Asian Race No Advanced Age No Female Gender Yes Dementia No Poor health/fragility No Current cigarette smoker No Low body weight (<127 lbs) No Estrogen deficiency No Low calcium intake (lifelong) No Alcoholism No Inadequate physical activity No Poor eyesight/risk of falls No     Tetanus/Td Vaccine    Vaccine Type: Tdap    Site: right deltoid    Mfr: Sanofi Pasteur    Dose: 0.5 ml    Route: IM    Given by: Armenia Shannon    Exp. Date: 11/01/2012    Lot #: [id number]    VIS given: 10/07/07 version given July 05, 2010.    Appended Document: Complete Physical Exam     Allergies: 1)  ! * Diclofenac   Complete Medication List: 1)  Metformin Hcl 500 Mg Tabs (Metformin hcl) .... One tablet by mouth two times a day for 1 week then increase to two tablets by mouth two times a day for diabetes 2)  Ibuprofen 600 Mg Tabs (Ibuprofen) .... One tablet by mouth two times a day as needed for pain 3)  Blood Glucose Meter Kit  (Blood glucose monitoring suppl) .... Use to check blood sugar daily before meals 4)  Blood Glucose Test Strp (Glucose blood) .... Use to check blood sugar daily before meals 5)  Neurontin 300 Mg Caps (Gabapentin) .... One capsule by mouth nightly 6)  Pravastatin Sodium 20 Mg Tabs (Pravastatin sodium) .... One tablet by mouth nightly for cholesterol   Laboratory Results  Date/Time Received: July 11, 2010 4:51 PM   Stool - Occult Blood Hemmoccult #1: negative Date: 07/11/2010 Hemoccult #2: negative Date: 07/11/2010 Hemoccult #3: negative Date: 07/11/2010

## 2010-12-19 NOTE — Letter (Signed)
Summary: RETASURE  RETASURE   Imported By: Arta Bruce 05/17/2010 12:55:22  _____________________________________________________________________  External Attachment:    Type:   Image     Comment:   External Document

## 2010-12-19 NOTE — Letter (Signed)
Summary: *HSN Results Follow up  [facility]  [address] [place], [place] [address]   Phone: [phone]  Fax: [phone]      07/12/2010   [facility] [address] [place], [place]  [address]   Dear  Ms. Michelle Robertson,                            ____S.Drinkard,FNP   ____D. Gore,FNP       ____B. McPherson,MD   ____V. Rankins,MD    ____E. Mulberry,MD    _X___N. Daphine Deutscher, FNP  ____D. Reche Dixon, MD    ____K. Philipp Deputy, MD    ____Other     This letter is to inform you that your recent test(s):  Stool Cards    ___X____ is within acceptable limits  _______ requires a medication change  _______ requires a follow-up lab visit  _______ requires a follow-up visit with your provider   Comments: Stool cards x 3 are normal.       _________________________________________________________ If you have any questions, please contact our office [phone].                    Sincerely,    Michelle Prom FNP [facility]

## 2010-12-19 NOTE — Progress Notes (Signed)
Summary: Additional questions  Phone Note Call from Patient   Summary of Call: pt had some more concerns but did not mention them during visit.... she wanted to know if she can have a med for her finger funus... and she wants to know will metformin make her hair come out... pt says since she started taking metformin, she has been losing hair.... [facility] Initial call taken by: Armenia Shannon,  July 05, 2010 12:44 PM  Follow-up for Phone Call        fingernails are turning with fungus due to uncontrolled blood sugars. i would advise she continue efforts to control her blood sugars as she still has sugar in her urine and elevated hgba1c. metformin - unsure if this causes hair loss, that's not a side effect that i am familiar with Follow-up by: Lehman Prom FNP,  July 05, 2010 3:16 PM  Additional Follow-up for Phone Call Additional follow up Details #1::        spoke with pt and she is aware and said she is trying to work on her sugar Additional Follow-up by: Armenia Shannon,  July 05, 2010 3:34 PM

## 2010-12-19 NOTE — Letter (Signed)
Summary: *HSN Results Follow up  [facility]  [address] [place], [place] [address]   Phone: [phone]  Fax: [phone]      07/07/2010   [facility] [address] [place], [place]  [address]   Dear  Ms. Michelle Robertson,                            ____S.Drinkard,FNP   ____D. Gore,FNP       ____B. McPherson,MD   ____V. Rankins,MD    ____E. Mulberry,MD    _X___N. Daphine Deutscher, FNP  ____D. Reche Dixon, MD    ____K. Philipp Deputy, MD    ____Other     This letter is to inform you that your recent test(s):  ____X___Pap Smear    _______Lab Test     _______X-ray    ____X___ is within acceptable limits  _______ requires a medication change  _______ requires a follow-up lab visit  _______ requires a follow-up visit with your provider   Comments: Pap Smear results normal.       _________________________________________________________ If you have any questions, please contact our office [phone].                    Sincerely,    Lehman Prom FNP [facility]

## 2010-12-19 NOTE — Progress Notes (Signed)
Summary: Abnormal retasure  Phone Note Outgoing Call   Summary of Call: Advise pt that retasure eye screening done at [facility] on 04/10/2010 was abnormal. This may be due to uncontrolled diabetes for a long period of time before re-establishing in this office she will need to get an appointment with an eye doctor for a more detailed evaluation [facility] does not have an eye care provider that will accept the orange card we can refer to Dr. Erskine Emery who charges a $115 co-payment at time of visit or she can find her own provider Initial call taken by: Lehman Prom FNP,  May 17, 2010 8:42 AM  Follow-up for Phone Call        [name]  May 18, 2010 10:44 AM Left message on machine for pt to return call to the office.  [name]  May 23, 2010 10:07 AM pt informed of above information. Follow-up by: [name],  May 23, 2010 10:07 AM     Diabetes Management History:      The patient is a 60 years old female who comes in for evaluation of DM Type 2.  She has not been enrolled in the "Diabetic Education Program".  She is not checking home blood sugars.    Diabetes Management Exam:    Eye Exam:       Eye Exam done elsewhere          Date: 04/10/2010          Results: ? left eye with background retinopathy          Done by: Retasure  Diabetes Management Assessment/Plan:      Her blood pressure goal is < 130/80.

## 2010-12-19 NOTE — Letter (Signed)
Summary: PT INFORMATION SHEET  PT INFORMATION SHEET   Imported By: Arta Bruce 05/10/2010 14:50:18  _____________________________________________________________________  External Attachment:    Type:   Image     Comment:   External Document

## 2010-12-19 NOTE — Letter (Signed)
Summary: Handout Printed  Printed Handout:  - [place]union 

## 2010-12-19 NOTE — Assessment & Plan Note (Signed)
Summary: Diabetes   Vital Signs:  Patient profile:   60 year old female Menstrual status:  postmenopausal Weight:      137.3 pounds Temp:     97.8 degrees F oral Pulse rate:   83 / minute Pulse rhythm:   regular Resp:     20 per minute BP sitting:   136 / 80  (left arm) Cuff size:   regular  Vitals Entered ByLevon Hedger (Apr 06, 2010 12:21 PM) CC: follow-up visit pt has some questions for the doctor Is Patient Diabetic? Yes Pain Assessment Patient in pain? no      CBG Result 188 CBG Device ID A  Does patient need assistance? Functional Status Self care Ambulation Normal   CC:  follow-up visit pt has some questions for the doctor.  History of Present Illness:  Pt into the office for results of KUB.  Hospital on last week for chest pain and palpitations Stress test negative Pt had concerns that the neurontin was causing the chest pain so she stopped taking the medications Pt was started on neurontin during her last visit - helped with back and some abdominal pain but pain in the left hip persisted.   Constipation - pt took Milk of magnesia after receiving KUB with "good" results. pt was advised to take magesium citrate by provider but she did not  Diabetes - restarted on medications once establishing here. Pt is taking as ordered checking blood sugars daily and notes some decrease   Left hip - still ongoing.   Back pain and abdominal pain has improved with neurontin.  Diabetes Management History:      The patient is a 60 years old female who comes in for evaluation of DM Type 2.  She has not been enrolled in the "Diabetic Education Program".  She states understanding of dietary principles and is following her diet appropriately.  No sensory loss is reported.  Self foot exams are not being performed.  She is not checking home blood sugars.        Hypoglycemic symptoms are not occurring.  No hyperglycemic symptoms are reported.        There are no symptoms to  suggest diabetic complications.  No changes have been made to her treatment plan since last visit.    Allergies (verified): 1)  ! * Diclofenac  Review of Systems CV:  Denies chest pain or discomfort. Resp:  Denies cough. GI:  Denies abdominal pain, nausea, and vomiting. MS:  Complains of joint pain; left hip. Endo:  Complains of excessive thirst and excessive urination.  Physical Exam  General:  alert.   Head:  normocephalic.   Lungs:  normal breath sounds.   Heart:  normal rate and regular rhythm.   Abdomen:  normal bowel sounds.   Msk:  bilateral bunions - no inflammation Neurologic:  alert & oriented X3.    Diabetes Management Exam:       Nails:          Left foot: normal          Right foot: normal   Hip Exam  Hip Exam:    Left:    Inspection:  Normal    Palpation:  Normal       Location:  trochanteric bursa    Stability:  stable    Tenderness:  trochanteric bursa    Swelling:  no    Erythema:  no   Impression & Recommendations:  Problem # 1:  DIABETES MELLITUS,  TYPE II (ICD-250.00) advised pt to check blood sugar at least once per day continue current medications she has been to see diabetic educator as ordered Her updated medication list for this problem includes:    Metformin Hcl 500 Mg Tabs (Metformin hcl) ..... One tablet by mouth two times a day for 1 week then increase to two tablets by mouth two times a day for diabetes  Orders: Capillary Blood Glucose/CBG (01027)  Problem # 2:  HIP PAIN, LEFT (ICD-719.45) ? bursitits will order steroid taper Her updated medication list for this problem includes:    Ibuprofen 600 Mg Tabs (Ibuprofen) ..... One tablet by mouth two times a day as needed for pain  Problem # 3:  CONSTIPATION (ICD-564.00) pt should take stool softner and eat fiber rich foods advised kub results  Problem # 4:  BUNIONS, BILATERAL (ICD-727.1) reviewed dx with pt she shoulf avoid tight fitting shoes  Complete Medication List: 1)   Metformin Hcl 500 Mg Tabs (Metformin hcl) .... One tablet by mouth two times a day for 1 week then increase to two tablets by mouth two times a day for diabetes 2)  Ibuprofen 600 Mg Tabs (Ibuprofen) .... One tablet by mouth two times a day as needed for pain 3)  Blood Glucose Meter Kit (Blood glucose monitoring suppl) .... Use to check blood sugar daily before meals 4)  Blood Glucose Test Strp (Glucose blood) .... Use to check blood sugar daily before meals 5)  Neurontin 300 Mg Caps (Gabapentin) .... One capsule by mouth nightly 6)  Prednisone 10 Mg Tabs (Prednisone) .... 30mg  x 2 days, 20mg  x 2 days, 10mg  x 2 days for inflammation  Diabetes Management Assessment/Plan:      Her blood pressure goal is < 130/80.    Patient Instructions: 1)  Blood sugar should be within 80 and 120 before breakfast in the morning. 2)  You should check your blood sugars in the mornings. 3)  Keep taking metformin as ordered for now 4)  Left hip - 5)  Prednisone  6)  (30mg ) 3 tabs Thursday 7)  (30mg ) 3 tabs Friday 8)  (20mg ) 2 tabs Saturday 9)  (20mg ) 2 tabs Sunday 10)  (10mg ) 1 tab Monday 11)  (10mg ) 1 tab Tuesday 12)  Still if no improvement after prednisone taper you will need referral to physical therapy. (call office before next visit)  Continue ibuprofen and neurontin. 13)  Follow up with n.martin, fnp in 6 weeks for a complete physical exam and diabetes. 14)  Bring blood sugar log with you Prescriptions: PREDNISONE 10 MG TABS (PREDNISONE) 30mg  x 2 days, 20mg  x 2 days, 10mg  x 2 days for inflammation  #12 x 0   Entered and Authorized by:   Lehman Prom FNP   Signed by:   Lehman Prom FNP on 04/06/2010   Method used:   Print then Give to Patient   RxID:   [id number]

## 2010-12-19 NOTE — Letter (Signed)
Summary: Handout Printed  Printed Handout:  - Bursitis

## 2010-12-19 NOTE — Letter (Signed)
Summary: NUTRIONAL SUMMARY//SUSIE  NUTRIONAL SUMMARY//SUSIE   Imported By: Arta Bruce 04/12/2010 10:53:53  _____________________________________________________________________  External Attachment:    Type:   Image     Comment:   External Document

## 2010-12-19 NOTE — Letter (Signed)
Summary: Lipid Letter  [facility]  [address] [place], [place] [address]   Phone: [phone]  Fax: [phone]    07/06/2010  [facility] [address] [place], [place]  [address]  Dear Shanon Ace:  We have carefully reviewed your last lipid profile from 07/05/2010 and the results are noted below with a summary of recommendations for lipid management.    Cholesterol:       258     Goal: less than 200   HDL "good" Cholesterol:   38     Goal: Greater than 40   LDL "bad" Cholesterol:   173     Goal: less than 70   Triglycerides:       237     Goal: less than 150    Your cholesterol is high.  You should have spoken with this office about the need to start medications.  You should also start a low fat, low cholesterol diet.  Schedule a fasting lab visit 8 weeks after you start the medication to recheck cholesterol.     Current Medications: 1)    Metformin Hcl 500 Mg Tabs (Metformin hcl) .... One tablet by mouth two times a day for 1 week then increase to two tablets by mouth two times a day for diabetes 2)    Ibuprofen 600 Mg Tabs (Ibuprofen) .... One tablet by mouth two times a day as needed for pain 3)    Blood Glucose Meter  Kit (Blood glucose monitoring suppl) .... Use to check blood sugar daily before meals 4)    Blood Glucose Test  Strp (Glucose blood) .... Use to check blood sugar daily before meals 5)    Neurontin 300 Mg Caps (Gabapentin) .... One capsule by mouth nightly 6)    Pravastatin Sodium 20 Mg Tabs (Pravastatin sodium) .... One tablet by mouth nightly for cholesterol  If you have any questions, please call. We appreciate being able to work with you.   Sincerely,    [facility] Lehman Prom FNP

## 2010-12-19 NOTE — Letter (Signed)
Summary: Lipid Letter  [facility]  [address] [place], [place] [address]   Phone: [phone]  Fax: [phone]    09/16/2010  [facility] [address] [place], [place]  [address]  Dear Michelle Robertson:  We have carefully reviewed your last lipid profile from 09/14/2010 and the results are noted below with a summary of recommendations for lipid management.    Cholesterol:       228     Goal: less than 200   HDL "good" Cholesterol:   44     Goal: greater than 200   LDL "bad" Cholesterol:   163     Goal: less than 70   Triglycerides:       105     Goal: less than 150    Cholesterol is still high.  You should have spoken with this office about adjustment of your medications. You should have also spoken with this office about your bone density results.      Current Medications: 1)    Metformin Hcl 500 Mg Tabs (Metformin hcl) .... One tablet by mouth two times a day for 1 week then increase to two tablets by mouth two times a day for diabetes 2)    Ibuprofen 600 Mg Tabs (Ibuprofen) .... One tablet by mouth two times a day as needed for pain 3)    Blood Glucose Meter  Kit (Blood glucose monitoring suppl) .... Use to check blood sugar daily before meals 4)    Blood Glucose Test  Strp (Glucose blood) .... Use to check blood sugar daily before meals 5)    Neurontin 300 Mg Caps (Gabapentin) .... One capsule by mouth nightly 6)    Pravastatin Sodium 40 Mg Tabs (Pravastatin sodium) .... One tablet by mouth nightly for cholesterol  *note change in dose** 7)    Calcium-d 600-200 Mg-unit Tabs (Calcium carbonate-vitamin d) .... One tablet by mouth two times a day for bones  If you have any questions, please call. We appreciate being able to work with you.   Sincerely,    [facility] Lehman Prom FNP

## 2010-12-19 NOTE — Assessment & Plan Note (Signed)
Summary: Left flank pain   Vital Signs:  Patient profile:   60 year old female Menstrual status:  postmenopausal Weight:      137.8 pounds BMI:     25.30 BSA:     1.63 Temp:     97.7 degrees F oral Pulse rate:   79 / minute Pulse rhythm:   regular Resp:     20 per minute BP sitting:   109 / 74  (left arm) Cuff size:   regular  Vitals Entered By: [name] (Mar 20, 2010 12:15 PM) CC: follow-up visit DM....alot of pain in left hip radiating to her back and stomach x 1 month...pain get worse at night when she lies down Is Patient Diabetic? Yes Pain Assessment Patient in pain? yes     Location: back , stomach, hip Intensity: 7 Onset of pain  Constant get worse at night CBG Result 122 CBG Device ID A  Does patient need assistance? Functional Status Self care Ambulation Normal   CC:  follow-up visit DM....alot of pain in left hip radiating to her back and stomach x 1 month...pain get worse at night when she lies down.  History of Present Illness:  Pt into the office with worsening left flank pain Seen in this office last week as a new pt - diabetes pain in left flank has increased previous x-ray of left hip done at urgent care only showed mild arthritis no change in activity since her last visit here she has been taking ibuprofen as ordered and pain is still there intermittent - almost unbearable at times  Hx of kidney stones several years ago.  no intervention at that time.  she spontaneously passed the stone   Allergies (verified): 1)  ! * Diclofenac  Review of Systems General:  Denies fever. CV:  Denies chest pain or discomfort. Resp:  Denies cough. GI:  Denies abdominal pain, nausea, and vomiting. GU:  Denies urinary frequency.  Physical Exam  General:  alert.   Head:  normocephalic.   Abdomen:  left flank pain  Diabetes Management Exam:    Foot Exam (with socks and/or shoes not present):       Sensory-Monofilament:          Left foot: normal          Right foot: normal   Impression & Recommendations:  Problem # 1:  FLANK PAIN, LEFT (ICD-789.09) will order KUB to check for kidney stones will give toradol in office wil Her updated medication list for this problem includes:    Ibuprofen 600 Mg Tabs (Ibuprofen) ..... One tablet by mouth two times a day as needed for pain  Orders: Radiology other (Radiology Other)  Problem # 2:  DIABETES MELLITUS, TYPE II (ICD-250.00) continue current meds just started on last week Her updated medication list for this problem includes:    Metformin Hcl 500 Mg Tabs (Metformin hcl) ..... One tablet by mouth two times a day for 1 week then increase to two tablets by mouth two times a day for diabetes  Orders: Capillary Blood Glucose/CBG (45409) UA Dipstick w/o Micro (manual) (81191)  Complete Medication List: 1)  Metformin Hcl 500 Mg Tabs (Metformin hcl) .... One tablet by mouth two times a day for 1 week then increase to two tablets by mouth two times a day for diabetes 2)  Ibuprofen 600 Mg Tabs (Ibuprofen) .... One tablet by mouth two times a day as needed for pain 3)  Blood Glucose Meter Kit (Blood  glucose monitoring suppl) .... Use to check blood sugar daily before meals 4)  Blood Glucose Test Strp (Glucose blood) .... Use to check blood sugar daily before meals 5)  Neurontin 300 Mg Caps (Gabapentin) .... One capsule by mouth nightly  Other Orders: Admin of Therapeutic Inj  intramuscular or subcutaneous (91478) Ketorolac-Toradol 15mg  (G9562)  Patient Instructions: 1)  You will need to get a KUB done at [facility] 2)  Start neurontin 300mg  by mouth nightly  3)  Follow up in 2 weeks with n.martin,fnp for KUB results and left flank pain. Prescriptions: NEURONTIN 300 MG CAPS (GABAPENTIN) One capsule by mouth nightly  #30 x 0   Entered and Authorized by:   Lehman Prom FNP   Signed by:   Lehman Prom FNP on 03/20/2010   Method used:   Print then Give to Patient   RxID:    [id number]          Diabetic Foot Exam    10-g (5.07) Semmes-Weinstein Monofilament Test Performed by: [name]          Right Foot          Left Foot Visual Inspection               Test Control      normal         normal Site 1         normal         normal Site 2         normal         normal Site 3         normal         normal Site 4         normal         normal Site 5         normal         normal Site 6         normal         normal Site 7         normal         normal Site 8         normal         normal Site 9         normal         normal Site 10         normal         normal  Impression      normal         normal   Laboratory Results   Urine Tests  Date/Time Received: Mar 20, 2010 1:02 PM   Routine Urinalysis   Color: lt. yellow Glucose: negative   (Normal Range: Negative) Bilirubin: negative   (Normal Range: Negative) Ketone: trace (5)   (Normal Range: Negative) Spec. Gravity: >=1.030   (Normal Range: 1.003-1.035) Blood: negative   (Normal Range: Negative) pH: 5.0   (Normal Range: 5.0-8.0) Protein: trace   (Normal Range: Negative) Urobilinogen: 0.2   (Normal Range: 0-1) Nitrite: negative   (Normal Range: Negative) Leukocyte Esterace: negative   (Normal Range: Negative)     Blood Tests     CBG Random:: 122       Medication Administration  Injection # 1:    Medication: Ketorolac-Toradol 15mg     Diagnosis: HIP PAIN, LEFT (ICD-719.45)    Route: IM    Site: RUOQ gluteus  Exp Date: 3/12    Lot #: [id number]    Mfr: [facility]    Patient tolerated injection without complications    Given by: [name] (Mar 20, 2010 1:09 PM)  Orders Added: 1)  Capillary Blood Glucose/CBG [82948] 2)  Est. Patient Level III [45409] 3)  Radiology other [Radiology Other] 4)  Admin of Therapeutic Inj  intramuscular or subcutaneous [96372] 5)  Ketorolac-Toradol 15mg  [J1885] 6)  UA Dipstick w/o Micro (manual) [81002]

## 2011-01-16 ENCOUNTER — Encounter: Payer: Self-pay | Admitting: Nurse Practitioner

## 2011-01-16 ENCOUNTER — Encounter (INDEPENDENT_AMBULATORY_CARE_PROVIDER_SITE_OTHER): Payer: Self-pay | Admitting: Nurse Practitioner

## 2011-01-16 DIAGNOSIS — F411 Generalized anxiety disorder: Secondary | ICD-10-CM | POA: Insufficient documentation

## 2011-01-16 LAB — CONVERTED CEMR LAB
Bilirubin Urine: NEGATIVE
Cholesterol, target level: 200 mg/dL
LDL Goal: 100 mg/dL
Specific Gravity, Urine: >=1.03
Urobilinogen, UA: 0.2

## 2011-01-25 NOTE — Assessment & Plan Note (Signed)
Summary: Anxiety   Vital Signs:  Patient profile:   60 year old female Menstrual status:  postmenopausal Weight:      130.3 pounds BMI:     23.92 Temp:     98.1 degrees F oral Pulse rate:   62 / minute Pulse rhythm:   regular Resp:     16 per minute BP sitting:   114 / 76  (left arm) Cuff size:   regular  Vitals Entered ByMarland Kitchen [name] (January 16, 2011 12:07 PM) CC: follow-up visit [facility] 10/2010, Lipid Management, Depression Is Patient Diabetic? Yes Pain Assessment Patient in pain? no      CBG Result 92  Does patient need assistance? Functional Status Self care Ambulation Normal   CC:  follow-up visit [facility] 10/2010, Lipid Management, and Depression.  History of Present Illness:  Pt into the office with c/o hospital f/u  Social - pt's husband had a stroke in 10/11 and is now in a nursing home. She still goes there to check on him.  She has had to take a job to help support herself.   Depression History:      Positive alarm features for depression include fatigue (loss of energy).  However, she denies recurrent thoughts of death or suicide.        Psychosocial stress factors include major life changes.  The patient denies that she feels like life is not worth living, denies that she wishes that she were dead, and denies that she has thought about ending her life.         Diabetes Management History:      The patient is a 60 years old female who comes in for evaluation of DM Type 2.  She has not been enrolled in the "Diabetic Education Program".  She states lack of understanding of dietary principles and is not following her diet appropriately.  No sensory loss is reported.  Self foot exams are not being performed.  She is checking home blood sugars.  She says that she is not exercising regularly.        Hypoglycemic symptoms are not occurring.  No hyperglycemic symptoms are reported.  Other comments include: pt reports she is taking meds as ordered.        No changes  have been made to her treatment plan since last visit.    Lipid Management History:      Positive NCEP/ATP III risk factors include female age 89 years old or older and diabetes.  Negative NCEP/ATP III risk factors include non-tobacco-user status.      Allergies (verified): 1)  ! * Diclofenac  Review of Systems General:  Denies loss of appetite and sleep disorder; Some weeks of not being able to sleep because she had some 24 hours shifts. CV:  Denies chest pain or discomfort. Resp:  Denies cough. GI:  Denies abdominal pain, nausea, and vomiting. Psych:  Complains of anxiety.  Physical Exam  General:  alert.   Head:  normocephalic.   Msk:  normal ROM.   Neurologic:  alert & oriented X3.   Skin:  color normal.   Psych:  Oriented X3.    Diabetes Management Exam:    Foot Exam (with socks and/or shoes not present):       Sensory-Monofilament:          Left foot: normal          Right foot: normal   Impression & Recommendations:  Problem # 1:  DIABETES MELLITUS, TYPE  II (ICD-250.00) stable at this time continue current meds Her updated medication list for this problem includes:    Metformin Hcl 500 Mg Tabs (Metformin hcl) ..... One tablet by mouth two times a day for 1 week then increase to two tablets by mouth two times a day for diabetes  Orders: Capillary Blood Glucose/CBG (82948) Hemoglobin A1C (83036) UA Dipstick w/o Micro (manual) (04540)  Problem # 2:  HYPERCHOLESTEROLEMIA (ICD-272.0) pt admitted that she is not taking meds and she is instead making some dietary changes Her updated medication list for this problem includes:    Pravastatin Sodium 40 Mg Tabs (Pravastatin sodium) ..... Hold  Problem # 3:  ANXIETY (ICD-300.00)  spoke with pt and she is indeed under lots of stress  Her updated medication list for this problem includes:    Diazepam 5 Mg Tabs (Diazepam) ..... One-half  tablet by mouth nightly as needed for pain  Complete Medication List: 1)   Metformin Hcl 500 Mg Tabs (Metformin hcl) .... One tablet by mouth two times a day for 1 week then increase to two tablets by mouth two times a day for diabetes 2)  Ibuprofen 600 Mg Tabs (Ibuprofen) .... One tablet by mouth two times a day as needed for pain 3)  Blood Glucose Meter Kit (Blood glucose monitoring suppl) .... Use to check blood sugar daily before meals 4)  Blood Glucose Test Strp (Glucose blood) .... Use to check blood sugar daily before meals 5)  Neurontin 300 Mg Caps (Gabapentin) .... One capsule by mouth nightly 6)  Pravastatin Sodium 40 Mg Tabs (Pravastatin sodium) .... Hold 7)  Calcium-d 600-200 Mg-unit Tabs (Calcium carbonate-vitamin d) .... One tablet by mouth two times a day for bones 8)  Diazepam 5 Mg Tabs (Diazepam) .... One-half  tablet by mouth nightly as needed for pain  Diabetes Management Assessment/Plan:      The following lipid goals have been established for the patient: Total cholesterol goal of 200; LDL cholesterol goal of 100; HDL cholesterol goal of 40; Triglyceride goal of 150.  Her blood pressure goal is < 130/80.    Lipid Assessment/Plan:      Based on NCEP/ATP III, the patient's risk factor category is "history of diabetes".  The patient's lipid goals are as follows: Total cholesterol goal is 200; LDL cholesterol goal is 100; HDL cholesterol goal is 40; Triglyceride goal is 150.    Patient Instructions: 1)  Follow up in 6 weeks for anxiety. 2)  Come fasting for labs - lipids, cmp, cbc, tsh Prescriptions: DIAZEPAM 5 MG TABS (DIAZEPAM) One-half  tablet by mouth nightly as needed for pain  #15 x 0   Entered and Authorized by:   Lehman Prom FNP   Signed by:   Lehman Prom FNP on 01/16/2011   Method used:   Historical   RxID:   [id number]    Orders Added: 1)  Capillary Blood Glucose/CBG [08657] 2)  Est. Patient Level III [84696] 3)  Hemoglobin A1C [83036] 4)  UA Dipstick w/o Micro (manual) [81002]     Last LDL:                                                  163 (09/14/2010 9:41:00 PM)        Diabetic Foot Exam    10-g (5.07) Semmes-Weinstein Monofilament Test Performed by:  [name]          Right Foot          Left Foot Visual Inspection               Test Control      normal         normal Site 1         normal         normal Site 2         normal         normal Site 3         normal         normal Site 4         normal         normal Site 5         normal         normal Site 6         normal         normal Site 7         normal         normal Site 8         normal         normal Site 9         normal         normal Site 10         normal         normal  Impression      normal         normal   Laboratory Results   Urine Tests  Date/Time Received: January 16, 2011 12:34 PM   Routine Urinalysis   Color: yellow Appearance: Clear Glucose: negative   (Normal Range: Negative) Bilirubin: negative   (Normal Range: Negative) Ketone: trace (5)   (Normal Range: Negative) Spec. Gravity: >=1.030   (Normal Range: 1.003-1.035) Blood: negative   (Normal Range: Negative) pH: 6.0   (Normal Range: 5.0-8.0) Protein: trace   (Normal Range: Negative) Urobilinogen: 0.2   (Normal Range: 0-1) Nitrite: negative   (Normal Range: Negative) Leukocyte Esterace: small   (Normal Range: Negative)     Blood Tests   Date/Time Received: January 16, 2011 2:01 PM   HGBA1C: 6.6%   (Normal Range: Non-Diabetic - 3-6%   Control Diabetic - 6-8%) CBG Random:: 92     Laboratory Results   Urine Tests    Routine Urinalysis   Color: yellow Appearance: Clear Glucose: negative   (Normal Range: Negative) Bilirubin: negative   (Normal Range: Negative) Ketone: trace (5)   (Normal Range: Negative) Spec. Gravity: >=1.030   (Normal Range: 1.003-1.035) Blood: negative   (Normal Range: Negative) pH: 6.0   (Normal Range: 5.0-8.0) Protein: trace   (Normal Range: Negative) Urobilinogen: 0.2   (Normal Range:  0-1) Nitrite: negative   (Normal Range: Negative) Leukocyte Esterace: small   (Normal Range: Negative)     Blood Tests     HGBA1C: 6.6%   (Normal Range: Non-Diabetic - 3-6%   Control Diabetic - 6-8%) CBG Random:: 92mg /dL

## 2011-02-05 LAB — URINALYSIS, ROUTINE W REFLEX MICROSCOPIC
Nitrite: NEGATIVE
Specific Gravity, Urine: 1.018 (ref 1.005–1.030)
Urobilinogen, UA: 1 mg/dL (ref 0.0–1.0)
pH: 7 (ref 5.0–8.0)

## 2011-02-05 LAB — BASIC METABOLIC PANEL
CO2: 22 mEq/L (ref 19–32)
Chloride: 104 mEq/L (ref 96–112)
Creatinine, Ser: 0.8 mg/dL (ref 0.4–1.2)
GFR calc Af Amer: >60 mL/min (ref >60)
Potassium: 4.2 mEq/L (ref 3.5–5.1)
Sodium: 135 mEq/L (ref 135–145)

## 2011-02-05 LAB — CBC
MCHC: 34.5 g/dL (ref 30.0–36.0)
MCV: 87 fL (ref 78.0–100.0)
Platelets: 280 10*3/uL (ref 150–400)
RBC: 3.87 MIL/uL (ref 3.87–5.11)
RDW: 14.2 % (ref 11.5–15.5)

## 2011-02-05 LAB — DIFFERENTIAL
Basophils Absolute: 0 10*3/uL (ref 0.0–0.1)
Basophils Relative: 0 % (ref 0–1)
Eosinophils Absolute: 0 10*3/uL (ref 0.0–0.7)
Eosinophils Relative: 0 % (ref 0–5)
Monocytes Absolute: 0.1 10*3/uL (ref 0.1–1.0)
Monocytes Relative: 1 % — ABNORMAL LOW (ref 3–12)
Neutro Abs: 8.1 10*3/uL — ABNORMAL HIGH (ref 1.7–7.7)

## 2011-02-05 LAB — POCT CARDIAC MARKERS
CKMB, poc: 1.4 ng/mL (ref 1.0–8.0)
Myoglobin, poc: 82.5 ng/mL (ref 12–200)
Myoglobin, poc: 94.6 ng/mL (ref 12–200)
Troponin i, poc: <0.05 ng/mL (ref 0.00–0.09)

## 2011-02-05 LAB — RAPID URINE DRUG SCREEN, HOSP PERFORMED
Cocaine: NOT DETECTED
Tetrahydrocannabinol: NOT DETECTED

## 2011-02-05 LAB — URINE MICROSCOPIC-ADD ON

## 2011-02-05 LAB — CK TOTAL AND CKMB (NOT AT ARMC)
CK, MB: 1.5 ng/mL (ref 0.3–4.0)
Total CK: 124 U/L (ref 7–177)

## 2011-02-06 LAB — POCT CARDIAC MARKERS
CKMB, poc: 1.3 ng/mL (ref 1.0–8.0)
Myoglobin, poc: 36.2 ng/mL (ref 12–200)
Myoglobin, poc: 48.6 ng/mL (ref 12–200)
Troponin i, poc: <0.05 ng/mL (ref 0.00–0.09)

## 2011-02-06 LAB — DIFFERENTIAL
Basophils Absolute: 0.1 10*3/uL (ref 0.0–0.1)
Basophils Relative: 1 % (ref 0–1)
Lymphocytes Relative: 54 % — ABNORMAL HIGH (ref 12–46)
Monocytes Relative: 5 % (ref 3–12)
Neutro Abs: 4 10*3/uL (ref 1.7–7.7)
Neutrophils Relative %: 39 % — ABNORMAL LOW (ref 43–77)

## 2011-02-06 LAB — BASIC METABOLIC PANEL
CO2: 26 mEq/L (ref 19–32)
Calcium: 9.8 mg/dL (ref 8.4–10.5)
Creatinine, Ser: 0.92 mg/dL (ref 0.4–1.2)
GFR calc Af Amer: >60 mL/min (ref >60)
GFR calc non Af Amer: >60 mL/min (ref >60)
Sodium: 141 mEq/L (ref 135–145)

## 2011-02-06 LAB — CBC
Hemoglobin: 10.6 g/dL — ABNORMAL LOW (ref 12.0–15.0)
MCHC: 33.9 g/dL (ref 30.0–36.0)
RBC: 3.6 MIL/uL — ABNORMAL LOW (ref 3.87–5.11)

## 2011-02-22 LAB — URINALYSIS, ROUTINE W REFLEX MICROSCOPIC
Ketones, ur: NEGATIVE mg/dL
Nitrite: NEGATIVE
Protein, ur: NEGATIVE mg/dL
Urobilinogen, UA: 0.2 mg/dL (ref 0.0–1.0)

## 2011-02-22 LAB — DIFFERENTIAL
Basophils Absolute: 0.1 10*3/uL (ref 0.0–0.1)
Lymphocytes Relative: 31 % (ref 12–46)
Lymphs Abs: 2.4 10*3/uL (ref 0.7–4.0)
Monocytes Absolute: 0.5 10*3/uL (ref 0.1–1.0)
Monocytes Relative: 6 % (ref 3–12)
Neutro Abs: 4.6 10*3/uL (ref 1.7–7.7)

## 2011-02-22 LAB — CBC
Hemoglobin: 11.1 g/dL — ABNORMAL LOW (ref 12.0–15.0)
RBC: 3.74 MIL/uL — ABNORMAL LOW (ref 3.87–5.11)
RDW: 13.2 % (ref 11.5–15.5)
WBC: 7.8 10*3/uL (ref 4.0–10.5)

## 2011-02-22 LAB — URINE MICROSCOPIC-ADD ON

## 2011-02-22 LAB — URINE CULTURE: Culture: NO GROWTH

## 2011-02-22 LAB — POCT I-STAT, CHEM 8
BUN: 15 mg/dL (ref 6–23)
Calcium, Ion: 1.11 mmol/L — ABNORMAL LOW (ref 1.12–1.32)
Chloride: 106 mEq/L (ref 96–112)
Creatinine, Ser: 1.1 mg/dL (ref 0.4–1.2)

## 2011-10-22 IMAGING — CR DG PELVIS 1-2V
1 series · 1 of 1 positions shown · non-contrast
Comparison: Abdomen film of 09/17/2009

CLINICAL DATA: Pain in the right hip

PELVIS - 1-2 VIEW

[view not recorded]
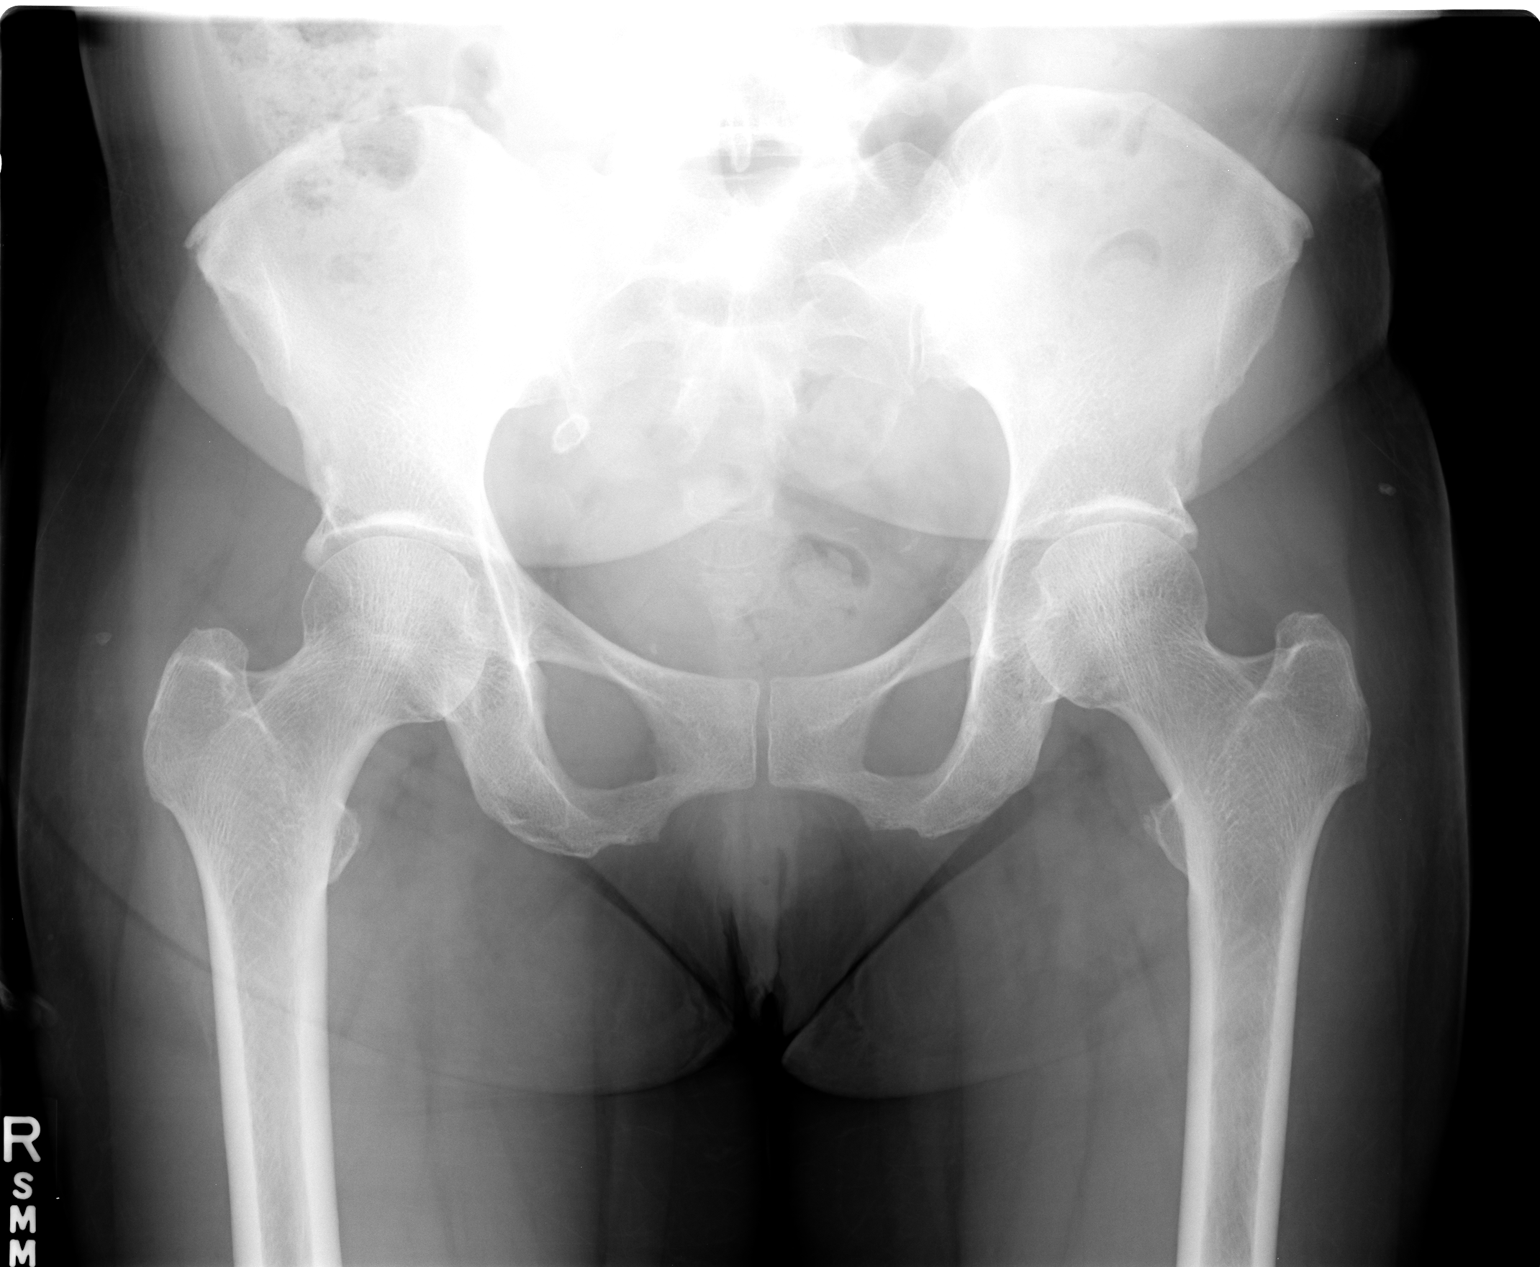

[1 of 1 positions shown; findings below may reference images not displayed]

FINDINGS: Both hips are in normal position with only minimal
degenerative change.  The pelvic rami are intact.  The SI joints
appear normal.  An oval calcification in the right bony pelvis is
unchanged and could represent phlebolith versus possible
enterolith.
IMPRESSION: 1.  No acute bony abnormality.  Mild degenerative change in the
hips.
2.  Oval calcification in the right bony pelvis may represent a
phlebolith or possibly enterolith.

## 2012-03-26 ENCOUNTER — Other Ambulatory Visit (HOSPITAL_COMMUNITY): Payer: Self-pay | Admitting: Internal Medicine

## 2012-03-26 DIAGNOSIS — Z1231 Encounter for screening mammogram for malignant neoplasm of breast: Secondary | ICD-10-CM

## 2012-04-23 ENCOUNTER — Ambulatory Visit (HOSPITAL_COMMUNITY)
Admission: RE | Admit: 2012-04-23 | Discharge: 2012-04-23 | Disposition: A | Discharge status: Home / Self Care | Payer: Self-pay | Source: Ambulatory Visit | Attending: Internal Medicine | Admitting: Internal Medicine

## 2012-04-23 DIAGNOSIS — Z1231 Encounter for screening mammogram for malignant neoplasm of breast: Secondary | ICD-10-CM | POA: Insufficient documentation

## 2012-11-12 ENCOUNTER — Encounter (HOSPITAL_COMMUNITY): Payer: Self-pay | Admitting: *Deleted

## 2012-11-12 ENCOUNTER — Observation Stay (HOSPITAL_COMMUNITY): Payer: Self-pay

## 2012-11-12 ENCOUNTER — Observation Stay (HOSPITAL_COMMUNITY)
Admission: EM | Admit: 2012-11-12 | Discharge: 2012-11-12 | Disposition: A | Discharge status: Home / Self Care | Payer: Self-pay | Attending: Emergency Medicine | Admitting: Emergency Medicine

## 2012-11-12 DIAGNOSIS — R61 Generalized hyperhidrosis: Secondary | ICD-10-CM | POA: Insufficient documentation

## 2012-11-12 DIAGNOSIS — R079 Chest pain, unspecified: Secondary | ICD-10-CM

## 2012-11-12 DIAGNOSIS — R002 Palpitations: Secondary | ICD-10-CM | POA: Insufficient documentation

## 2012-11-12 DIAGNOSIS — E119 Type 2 diabetes mellitus without complications: Secondary | ICD-10-CM | POA: Insufficient documentation

## 2012-11-12 DIAGNOSIS — R29898 Other symptoms and signs involving the musculoskeletal system: Principal | ICD-10-CM | POA: Insufficient documentation

## 2012-11-12 DIAGNOSIS — R0602 Shortness of breath: Secondary | ICD-10-CM | POA: Insufficient documentation

## 2012-11-12 DIAGNOSIS — M6281 Muscle weakness (generalized): Secondary | ICD-10-CM | POA: Insufficient documentation

## 2012-11-12 HISTORY — DX: Type 2 diabetes mellitus without complications: E11.9

## 2012-11-12 HISTORY — DX: Unspecified ectopic pregnancy without intrauterine pregnancy: O00.90

## 2012-11-12 LAB — CBC WITH DIFFERENTIAL/PLATELET
Basophils Relative: 0 % (ref 0–1)
Eosinophils Relative: 3 % (ref 0–5)
HCT: 32.7 % — ABNORMAL LOW (ref 36.0–46.0)
Hemoglobin: 10.7 g/dL — ABNORMAL LOW (ref 12.0–15.0)
MCH: 27.9 pg (ref 26.0–34.0)
MCHC: 32.7 g/dL (ref 30.0–36.0)
MCV: 85.2 fL (ref 78.0–100.0)
Monocytes Absolute: 0.5 10*3/uL (ref 0.1–1.0)
Monocytes Relative: 6 % (ref 3–12)
Neutro Abs: 3.6 10*3/uL (ref 1.7–7.7)

## 2012-11-12 LAB — POCT I-STAT, CHEM 8
Calcium, Ion: 1.21 mmol/L (ref 1.12–1.23)
Chloride: 108 mEq/L (ref 96–112)
Glucose, Bld: 136 mg/dL — ABNORMAL HIGH (ref 70–99)
HCT: 32 % — ABNORMAL LOW (ref 36.0–46.0)
TCO2: 25 mmol/L (ref 0–100)

## 2012-11-12 LAB — POCT I-STAT TROPONIN I

## 2012-11-12 LAB — GLUCOSE, CAPILLARY: Glucose-Capillary: 111 mg/dL — ABNORMAL HIGH (ref 70–99)

## 2012-11-12 MED ORDER — NITROGLYCERIN 0.4 MG SL SUBL
SUBLINGUAL_TABLET | SUBLINGUAL | Status: AC
  Administered 2012-11-12: 12:00:00
  Filled 2012-11-12: qty 25

## 2012-11-12 MED ORDER — METOPROLOL TARTRATE 1 MG/ML IV SOLN
INTRAVENOUS | Status: AC
  Filled 2012-11-12: qty 20

## 2012-11-12 MED ORDER — METOPROLOL TARTRATE 1 MG/ML IV SOLN
10.0000 mg | Freq: Once | INTRAVENOUS | Status: AC
  Administered 2012-11-12: 10 mg via INTRAVENOUS

## 2012-11-12 MED ORDER — IOHEXOL 350 MG/ML SOLN
80.0000 mL | Freq: Once | INTRAVENOUS | Status: AC | PRN
Start: 2012-11-12 — End: 2012-11-12
  Administered 2012-11-12: 80 mL via INTRAVENOUS

## 2012-11-12 MED ORDER — METOPROLOL TARTRATE 25 MG PO TABS
100.0000 mg | ORAL_TABLET | Freq: Once | ORAL | Status: AC
  Administered 2012-11-12: 100 mg via ORAL
  Filled 2012-11-12: qty 1
  Filled 2012-11-12: qty 2
  Filled 2012-11-12: qty 1

## 2012-11-12 MED ORDER — ASPIRIN 81 MG PO CHEW
324.0000 mg | CHEWABLE_TABLET | Freq: Once | ORAL | Status: AC
  Administered 2012-11-12: 324 mg via ORAL
  Filled 2012-11-12: qty 4

## 2012-11-12 NOTE — ED Provider Notes (Signed)
US guided IV access performed. 18 gauge long catheter placed in L basilic vein. No complications.   Richardean Canal, MD 11/12/12 [phone]

## 2012-11-12 NOTE — ED Notes (Signed)
2nd nurse unable to place iv.  Iv team paged.

## 2012-11-12 NOTE — ED Notes (Signed)
BMI  24.1.  Pt denies COPD, asthma, or cocaine use.  Pt is asymptomatic except for feeling fatigued.  Pt states she has some right sided chest pain that radiated across chest 2 days ago and then had some tingling in left arm.  No nitro paste on patient.  No allergy to contrast dye.  Pt is SR on the monitor in the 70s. No IV at this time, awaiting IV to try and insert 18 gauge

## 2012-11-12 NOTE — ED Notes (Signed)
Misty Stanley, RN with IV team notified & aware of need for 18g IV access for cardiac CT

## 2012-11-12 NOTE — ED Notes (Signed)
Pt. oob to the bathroom, gait steady denies any chest pain

## 2012-11-12 NOTE — ED Notes (Signed)
Iv access attempted x 2.  Another RN to try for iv site.

## 2012-11-12 NOTE — ED Notes (Signed)
IV team at bedside trying to obtain access

## 2012-11-12 NOTE — ED Provider Notes (Signed)
Care assumed at the change of shift. Pt on CP protocol, awaiting Coronary CT and second troponin.    Date: 11/12/2012 0719  Rate: 72  Rhythm: normal sinus rhythm  QRS Axis: normal  Intervals: normal  ST/T Wave abnormalities: normal  Conduction Disutrbances: none  Narrative Interpretation: unremarkable   12:19 PM CT Coronaries are negative per Dr. Carlota Raspberry. Plan for discharge.    Yvan Dority B. Bernette Mayers, MD 11/12/12 1220

## 2012-11-12 NOTE — ED Notes (Signed)
0720 EKG given to Dr. Bernette Mayers.

## 2012-11-12 NOTE — ED Provider Notes (Addendum)
History     CSN: [id number]  Arrival date & time 11/12/12  6295   First MD Initiated Contact with Patient 11/12/12 [phone]      Chief Complaint  Patient presents with  . Extremity Weakness    left arm    (Consider location/radiation/quality/duration/timing/severity/associated sxs/prior treatment) HPI Patient reports she awakened at 4 AM this morning to answer a text. A few minutes after awakening she developed weakness in her left arm other symptoms include palpitations. She denies chest pain. However for the past week she has been feeling generally weak, tired, occasionally mildly short of breath with exertion. She reports having broken out into a sweat 2 times. No treatment prior to coming here. Continues to feel weak in left arm at present, along with generalized weakness. Denies headache denies other associated symptoms Past Medical History  Diagnosis Date  . Diabetes mellitus without complication   . Ectopic pregnancy     Past Surgical History  Procedure Date  . Tubal ligation     History reviewed. No pertinent family history.  History  Substance Use Topics  . Smoking status: Never Smoker   . Smokeless tobacco: Not on file  . Alcohol Use: No    OB History    Grav Para Term Preterm Abortions TAB SAB Ect Mult Living                  Review of Systems  HENT: Negative.   Respiratory: Positive for shortness of breath.   Cardiovascular: Positive for palpitations.  Gastrointestinal: Negative.   Musculoskeletal: Negative.   Skin: Negative.   Neurological: Positive for weakness.       Generalized weakness and weakness in left arm pain today  Hematological: Negative.   Psychiatric/Behavioral: Negative.     Allergies  Review of patient's allergies indicates no known allergies.  Home Medications   Current Outpatient Rx  Name  Route  Sig  Dispense  Refill  . METFORMIN HCL 500 MG PO TABS   Oral   Take 500 mg by mouth 2 (two) times daily with a meal.            BP 169/97  Pulse 83  Temp 98.2 F (36.8 C)  Resp 20  SpO2 100%  Physical Exam  Nursing note and vitals reviewed. Constitutional: She is oriented to person, place, and time. She appears well-developed and well-nourished.  HENT:  Head: Normocephalic and atraumatic.  Eyes: Conjunctivae normal are normal. Pupils are equal, round, and reactive to light.  Neck: Neck supple. No tracheal deviation present. No thyromegaly present.  Cardiovascular: Normal rate and regular rhythm.   No murmur heard. Pulmonary/Chest: Effort normal and breath sounds normal.  Abdominal: Soft. Bowel sounds are normal. She exhibits no distension. There is no tenderness.  Musculoskeletal: Normal range of motion. She exhibits no edema and no tenderness.  Neurological: She is alert and oriented to person, place, and time. She has normal reflexes. She displays normal reflexes. No cranial nerve deficit. She exhibits normal muscle tone. Coordination normal.       Motor strength 5 over 5 overall. Romberg normal prior drift normal gait normal  Skin: Skin is warm and dry. No rash noted.  Psychiatric: She has a normal mood and affect.    ED Course  Procedures (including critical care time)  Labs Reviewed  POCT I-STAT, CHEM 8 - Abnormal; Notable for the following:    Glucose, Bld 136 (*)     Hemoglobin 10.9 (*)     HCT  32.0 (*)     All other components within normal limits  POCT I-STAT TROPONIN I  CBC WITH DIFFERENTIAL   No results found.   No diagnosis found.   Date: 11/12/2012  Rate: 70  Rhythm: normal sinus rhythm  QRS Axis: normal  Intervals: normal  ST/T Wave abnormalities: normal  Conduction Disutrbances:none  Narrative Interpretation: tallR wave in V2  Old EKG Reviewed: Renae Fickle R wave in lead V2 not seen on tracing from 07/05/2010 otherwise unchanged interpreted by me  6:10 AM patient feels improved weakness in arm is almost gone she feels more relaxed. States only mild weakness in left shoulder.  On reexamination she is alert Glasgow Coma Score 15 moves all extremities well MDM  Patient is exhibiting no signs of stroke. There is mild concern for silent cardiac ischemia given generalized weakness, palpitations, shortness of breath with exertion and occasional sweatiness Plan CDU chest pain protocol        Doug Sou, MD 11/12/12 6213  Doug Sou, MD 11/12/12 0617  Pt signed out to Dr Bernette Mayers 710am  Doug Sou, MD 11/12/12 0730

## 2012-11-12 NOTE — ED Notes (Signed)
Pt states she woke up from a text, but then her left arm started to progessivly get more weak and numb. Pt alert and oriented x 4, ambulatory. Pt denies pain in arm. Pt able to move extremities, pt follows commands.

## 2012-11-12 NOTE — ED Notes (Signed)
Pt's CBG was 111 when I checked it. 8:39 am JG.

## 2013-01-14 ENCOUNTER — Encounter (HOSPITAL_COMMUNITY): Payer: Self-pay

## 2013-01-14 ENCOUNTER — Emergency Department (HOSPITAL_COMMUNITY)
Admission: EM | Admit: 2013-01-14 | Discharge: 2013-01-14 | Disposition: A | Discharge status: Home / Self Care | Payer: No Typology Code available for payment source | Source: Home / Self Care

## 2013-01-14 NOTE — ED Notes (Signed)
Patient has history of DM Needs medication refill

## 2013-01-19 ENCOUNTER — Emergency Department (HOSPITAL_COMMUNITY)
Admission: EM | Admit: 2013-01-19 | Discharge: 2013-01-19 | Disposition: A | Discharge status: Home / Self Care | Payer: No Typology Code available for payment source | Source: Home / Self Care

## 2013-01-19 ENCOUNTER — Encounter (HOSPITAL_COMMUNITY): Payer: Self-pay

## 2013-01-19 DIAGNOSIS — E119 Type 2 diabetes mellitus without complications: Secondary | ICD-10-CM

## 2013-01-19 LAB — HEMOGLOBIN A1C: Hgb A1c MFr Bld: 7.4 % — ABNORMAL HIGH (ref <5.7)

## 2013-01-19 MED ORDER — TERBINAFINE HCL 1 % EX CREA: TOPICAL_CREAM | Freq: Two times a day (BID) | CUTANEOUS | Status: DC

## 2013-01-19 MED ORDER — METFORMIN HCL 500 MG PO TABS: 500.0000 mg | ORAL_TABLET | Freq: Two times a day (BID) | ORAL | Status: DC

## 2013-01-19 MED ORDER — GABAPENTIN 100 MG PO CAPS: 100.0000 mg | ORAL_CAPSULE | Freq: Three times a day (TID) | ORAL | Status: DC

## 2013-01-19 NOTE — ED Notes (Signed)
Follow up- DM Medication refill

## 2013-01-19 NOTE — ED Provider Notes (Signed)
History     CSN: [id number]  Arrival date & time 01/19/13  8838  62 year old femalewith a history of diabetes, who presents to the ER for  medication refill,pain in her feet, numbness and tingling in both legs. She is also complaining of occasional episodes of palpitations, she was in the ED in December for palpitations and lightheadedness and was ruled out for CDU protocol and sent home. Currently she denies any chest pain, lightheadedness, bilateral lower extremity edema, orthopnea,    Chief Complaint  Patient presents with  . Follow-up    (Consider location/radiation/quality/duration/timing/severity/associated sxs/prior treatment) HPI  Past Medical History  Diagnosis Date  . Diabetes mellitus without complication   . Ectopic pregnancy     Past Surgical History  Procedure Laterality Date  . Tubal ligation      No family history on file.  History  Substance Use Topics  . Smoking status: Never Smoker   . Smokeless tobacco: Not on file  . Alcohol Use: No    OB History   Grav Para Term Preterm Abortions TAB SAB Ect Mult Living                  Review of Systems Review of Systems  HENT: Negative.  Respiratory: negative shortness of breath.  Cardiovascular: Positive for palpitationsoccasionally.  Gastrointestinal: Negative.  Musculoskeletal: Negative.  Skin: Negative.  Neurological: Positive for weakness.  Generalized weakness and weakness in left arm pain today  Hematological: Negative.  Psychiatric/Behavioral: Negative.   Allergies  Review of patient's allergies indicates no known allergies.  Home Medications   Current Outpatient Rx  Name  Route  Sig  Dispense  Refill  . metFORMIN (GLUCOPHAGE) 500 MG tablet   Oral   Take 1 tablet (500 mg total) by mouth 2 (two) times daily with a meal.   60 tablet   2   . terbinafine (LAMISIL AT) 1 % cream   Topical   Apply topically 2 (two) times daily.   30 g   3     BP 137/72  Pulse 72  Temp(Src) 98.4 F  (36.9 C) (Oral)  Resp 17  SpO2 100%  Physical Exam Constitutional: She is oriented to person, place, and time. She appears well-developed and well-nourished.  HENT:  Head: Normocephalic and atraumatic.  Eyes: Conjunctivae normal are normal. Pupils are equal, round, and reactive to light.  Neck: Neck supple. No tracheal deviation present. No thyromegaly present.  Cardiovascular: Normal rate and regular rhythm.  No murmur heard.  Pulmonary/Chest: Effort normal and breath sounds normal.  Abdominal: Soft. Bowel sounds are normal. She exhibits no distension. There is no tenderness.  Musculoskeletal: Normal range of motion. She exhibits no edema and no tenderness.  Neurological: She is alert and oriented to person, place, and time. She has normal reflexes. She displays normal reflexes. No cranial nerve deficit. She exhibits normal muscle tone. Coordination normal.  Motor strength 5 over 5 overall. Romberg normal prior drift normal gait normal  Skin: Skin is warm and dry. No rash noted.  Psychiatric: She has a normal mood and affect.     ED Course  Procedures (including critical care time)  Labs Reviewed  TSH  HEMOGLOBIN A1C   No results found.   No diagnosis found.    MDM  #1 peripheral neuropathy start the patient on gabapentin 100 mg 3 times a day, continue this for 2 months and see the patient finds relief #2 diabetes check hemoglobin A1c, refills for metformin #3 dry skin, palpitations-will  check TSH, #4 onychomycosis-Lamisil Cream has been provided for her nails #5 followup in 2 months        Richarda Overlie, MD 01/19/13 1340

## 2013-04-02 ENCOUNTER — Telehealth: Payer: Self-pay | Admitting: Nurse Practitioner

## 2013-04-02 NOTE — Telephone Encounter (Signed)
Would like Doc To call in prescription for [facility] test strips for diabetes.  I informed pt that she has not been seen since 01/19/13 and needs to schedule an appt for check-up but she is insistent that it has been a long time since check and needs strips.

## 2013-04-04 ENCOUNTER — Other Ambulatory Visit: Payer: Self-pay | Admitting: Family Medicine

## 2013-04-04 MED ORDER — GLUCOSE BLOOD VI STRP: ORAL_STRIP | Status: DC

## 2013-04-04 NOTE — Telephone Encounter (Signed)
Pt called requesting Katheren Puller teststrips prescription.   Rx was sent to [facility].

## 2013-04-08 MED ORDER — GLUCOSE BLOOD VI STRP: ORAL_STRIP | Status: DC

## 2013-04-08 NOTE — Telephone Encounter (Signed)
Can we e-scribe test strips for this patient?

## 2013-04-20 ENCOUNTER — Telehealth: Payer: Self-pay | Admitting: Nurse Practitioner

## 2013-04-21 ENCOUNTER — Telehealth: Payer: Self-pay | Admitting: Nurse Practitioner

## 2013-05-01 NOTE — Telephone Encounter (Signed)
error 

## 2013-08-31 ENCOUNTER — Encounter (HOSPITAL_COMMUNITY): Payer: Self-pay | Admitting: Emergency Medicine

## 2013-08-31 ENCOUNTER — Emergency Department (HOSPITAL_COMMUNITY)
Admission: EM | Admit: 2013-08-31 | Discharge: 2013-09-01 | Disposition: A | Discharge status: Home / Self Care | Payer: Self-pay | Attending: Emergency Medicine | Admitting: Emergency Medicine

## 2013-08-31 DIAGNOSIS — R002 Palpitations: Secondary | ICD-10-CM | POA: Insufficient documentation

## 2013-08-31 DIAGNOSIS — R739 Hyperglycemia, unspecified: Secondary | ICD-10-CM

## 2013-08-31 DIAGNOSIS — E119 Type 2 diabetes mellitus without complications: Secondary | ICD-10-CM | POA: Insufficient documentation

## 2013-08-31 NOTE — ED Notes (Signed)
Pt reports experiencing heart palpitations/fluttering sensation starting in August, pt states she has now a dry cough associated with her palpitations x3 weeks, pt states "it pushes out the cough."

## 2013-09-01 ENCOUNTER — Emergency Department (HOSPITAL_COMMUNITY): Payer: Self-pay

## 2013-09-01 LAB — CBC
HCT: 31.7 % — ABNORMAL LOW (ref 36.0–46.0)
Hemoglobin: 10.7 g/dL — ABNORMAL LOW (ref 12.0–15.0)
MCH: 28.3 pg (ref 26.0–34.0)
MCHC: 33.8 g/dL (ref 30.0–36.0)

## 2013-09-01 LAB — BASIC METABOLIC PANEL
BUN: 13 mg/dL (ref 6–23)
CO2: 21 mEq/L (ref 19–32)
Chloride: 100 mEq/L (ref 96–112)
Creatinine, Ser: 0.71 mg/dL (ref 0.50–1.10)

## 2013-09-01 MED ORDER — METFORMIN HCL 500 MG PO TABS: 500.0000 mg | ORAL_TABLET | Freq: Two times a day (BID) | ORAL | Status: DC

## 2013-09-01 NOTE — ED Provider Notes (Signed)
CSN: [id number]     Arrival date & time 08/31/13  2247 History   First MD Initiated Contact with Patient 09/01/13 0033     Chief Complaint  Patient presents with  . Palpitations   (Consider location/radiation/quality/duration/timing/severity/associated sxs/prior Treatment) HPI 62 year old female presents emergency department with complaint of palpitations.  She has had this ongoing for the last 3 months.  The palpitations are the sensation of a skipped beat associated with a deep chest sensation that causes her to cough.  Tonight, symptoms were worse than normal, prompting her to come to the emergency department.  Patient has history of diabetes.  She is not currently on any medication for this due to losing her clinic.  She has not checked her sugar in some time.  She denies any fever, chills, no swelling in her lower extremities.  She denies any previous cardiac problems.  Past Medical History  Diagnosis Date  . Diabetes mellitus without complication   . Ectopic pregnancy    Past Surgical History  Procedure Laterality Date  . Tubal ligation     History reviewed. No pertinent family history. History  Substance Use Topics  . Smoking status: Never Smoker   . Smokeless tobacco: Not on file  . Alcohol Use: No   OB History   Grav Para Term Preterm Abortions TAB SAB Ect Mult Living                 Review of Systems  All other systems reviewed and are negative.    Allergies  Review of patient's allergies indicates no known allergies.  Home Medications  No current outpatient prescriptions on file. BP 98/54  Pulse 70  Temp(Src) 98.7 F (37.1 C) (Oral)  Resp 18  Ht 5\' 2"  (1.575 m)  Wt 143 lb 1.3 oz (64.9 kg)  BMI 26.16 kg/m2  SpO2 98% Physical Exam  Nursing note and vitals reviewed. Constitutional: She is oriented to person, place, and time. She appears well-developed and well-nourished.  HENT:  Head: Normocephalic and atraumatic.  Nose: Nose normal.  Mouth/Throat:  Oropharynx is clear and moist.  Eyes: Conjunctivae and EOM are normal. Pupils are equal, round, and reactive to light.  Neck: Normal range of motion. Neck supple. No JVD present. No tracheal deviation present. No thyromegaly present.  Cardiovascular: Normal rate, regular rhythm, normal heart sounds and intact distal pulses.  Exam reveals no gallop and no friction rub.   No murmur heard. Pulmonary/Chest: Effort normal and breath sounds normal. No stridor. No respiratory distress. She has no wheezes. She has no rales. She exhibits no tenderness.  Abdominal: Soft. Bowel sounds are normal. She exhibits no distension and no mass. There is no tenderness. There is no rebound and no guarding.  Musculoskeletal: Normal range of motion. She exhibits no edema and no tenderness.  Lymphadenopathy:    She has no cervical adenopathy.  Neurological: She is alert and oriented to person, place, and time. She exhibits normal muscle tone. Coordination normal.  Skin: Skin is warm and dry. No rash noted. No erythema. No pallor.  Psychiatric: She has a normal mood and affect. Her behavior is normal. Judgment and thought content normal.    ED Course  Procedures (including critical care time) Labs Review Labs Reviewed  BASIC METABOLIC PANEL - Abnormal; Notable for the following:    Glucose, Bld 317 (*)    All other components within normal limits  CBC - Abnormal; Notable for the following:    RBC 3.78 (*)  Hemoglobin 10.7 (*)    HCT 31.7 (*)    All other components within normal limits  GLUCOSE, CAPILLARY - Abnormal; Notable for the following:    Glucose-Capillary 282 (*)    All other components within normal limits  PRO B NATRIURETIC PEPTIDE  POCT I-STAT TROPONIN I   Imaging Review Dg Chest 2 View  09/01/2013   *RADIOLOGY REPORT*  Clinical Data: Chest pain with palpitations and cough.  CHEST - 2 VIEW  Comparison: CT of heart November 12, 2012.  Findings: The cardiomediastinal silhouette is unremarkable.   The lungs are clear without pleural effusions or focal consolidations. The pulmonary vasculature is unremarkable.   Trachea projects midline and there is no pneumothorax.  The included soft tissue planes and osseous structures are unremarkable.  IMPRESSION: No acute cardiopulmonary process.   Original Report Authenticated By: Awilda Metro    EKG Interpretation     Ventricular Rate:  86 PR Interval:  172 QRS Duration: 70 QT Interval:  360 QTC Calculation: 430 R Axis:   25 Text Interpretation:  Normal sinus rhythm Nonspecific T wave abnormality Abnormal ECG No significant change since last tracing            MDM   1. Palpitations   2. Hyperglycemia   3. Diabetes    62 year old female with palpitations, and cough.  We'll check labs, chest x-ray, EKG.  [facility], as well as cardiology.   Olivia Mackie, MD 09/01/13 [phone]

## 2013-09-01 NOTE — ED Notes (Signed)
Pt to xray

## 2013-09-02 ENCOUNTER — Observation Stay (HOSPITAL_COMMUNITY)
Admission: EM | Admit: 2013-09-02 | Discharge: 2013-09-03 | Disposition: A | Discharge status: Home / Self Care | Payer: Self-pay | Attending: Internal Medicine | Admitting: Internal Medicine

## 2013-09-02 ENCOUNTER — Emergency Department (HOSPITAL_COMMUNITY): Payer: Self-pay

## 2013-09-02 ENCOUNTER — Encounter (HOSPITAL_COMMUNITY): Payer: Self-pay | Admitting: Emergency Medicine

## 2013-09-02 DIAGNOSIS — Z8742 Personal history of other diseases of the female genital tract: Secondary | ICD-10-CM | POA: Insufficient documentation

## 2013-09-02 DIAGNOSIS — K5900 Constipation, unspecified: Secondary | ICD-10-CM

## 2013-09-02 DIAGNOSIS — R002 Palpitations: Principal | ICD-10-CM | POA: Insufficient documentation

## 2013-09-02 DIAGNOSIS — Z780 Asymptomatic menopausal state: Secondary | ICD-10-CM

## 2013-09-02 DIAGNOSIS — Z79899 Other long term (current) drug therapy: Secondary | ICD-10-CM | POA: Insufficient documentation

## 2013-09-02 DIAGNOSIS — Z8719 Personal history of other diseases of the digestive system: Secondary | ICD-10-CM | POA: Insufficient documentation

## 2013-09-02 DIAGNOSIS — N959 Unspecified menopausal and perimenopausal disorder: Secondary | ICD-10-CM | POA: Insufficient documentation

## 2013-09-02 DIAGNOSIS — E119 Type 2 diabetes mellitus without complications: Secondary | ICD-10-CM | POA: Insufficient documentation

## 2013-09-02 DIAGNOSIS — E785 Hyperlipidemia, unspecified: Secondary | ICD-10-CM | POA: Insufficient documentation

## 2013-09-02 DIAGNOSIS — E1165 Type 2 diabetes mellitus with hyperglycemia: Secondary | ICD-10-CM

## 2013-09-02 DIAGNOSIS — R109 Unspecified abdominal pain: Secondary | ICD-10-CM

## 2013-09-02 DIAGNOSIS — R079 Chest pain, unspecified: Secondary | ICD-10-CM | POA: Insufficient documentation

## 2013-09-02 DIAGNOSIS — IMO0002 Reserved for concepts with insufficient information to code with codable children: Secondary | ICD-10-CM

## 2013-09-02 DIAGNOSIS — F411 Generalized anxiety disorder: Secondary | ICD-10-CM | POA: Insufficient documentation

## 2013-09-02 DIAGNOSIS — E7800 Pure hypercholesterolemia, unspecified: Secondary | ICD-10-CM

## 2013-09-02 LAB — BASIC METABOLIC PANEL
BUN: 13 mg/dL (ref 6–23)
CO2: 26 mEq/L (ref 19–32)
Calcium: 9.6 mg/dL (ref 8.4–10.5)
Creatinine, Ser: 0.74 mg/dL (ref 0.50–1.10)
Glucose, Bld: 129 mg/dL — ABNORMAL HIGH (ref 70–99)
Sodium: 139 mEq/L (ref 135–145)

## 2013-09-02 LAB — CBC
HCT: 35.7 % — ABNORMAL LOW (ref 36.0–46.0)
Hemoglobin: 11.8 g/dL — ABNORMAL LOW (ref 12.0–15.0)
MCH: 28 pg (ref 26.0–34.0)
MCV: 84.6 fL (ref 78.0–100.0)
RBC: 4.22 MIL/uL (ref 3.87–5.11)

## 2013-09-02 LAB — GLUCOSE, CAPILLARY

## 2013-09-02 MED ORDER — ONDANSETRON HCL 4 MG/2ML IJ SOLN: 4.0000 mg | Freq: Four times a day (QID) | INTRAMUSCULAR | Status: DC | PRN

## 2013-09-02 MED ORDER — ASPIRIN 81 MG PO CHEW
324.0000 mg | CHEWABLE_TABLET | Freq: Once | ORAL | Status: AC
  Administered 2013-09-02: 324 mg via ORAL
  Filled 2013-09-02: qty 4

## 2013-09-02 MED ORDER — ACETAMINOPHEN 325 MG PO TABS: 650.0000 mg | ORAL_TABLET | ORAL | Status: DC | PRN

## 2013-09-02 MED ORDER — HEPARIN SODIUM (PORCINE) 5000 UNIT/ML IJ SOLN
5000.0000 [IU] | Freq: Three times a day (TID) | INTRAMUSCULAR | Status: DC
  Filled 2013-09-02 (×5): qty 1

## 2013-09-02 MED ORDER — METFORMIN HCL 500 MG PO TABS
500.0000 mg | ORAL_TABLET | Freq: Two times a day (BID) | ORAL | Status: DC
  Administered 2013-09-03: 500 mg via ORAL
  Filled 2013-09-02 (×3): qty 1

## 2013-09-02 NOTE — H&P (Signed)
[facility] History and Physical  Wilmarie Sparlin NWG:[id number] DOB: [birth year] DOA: 09/02/2013  Referring physician: ED PCP: Lehman Prom, NP  Chief Complaint: Palpitations  HPI: Michelle Robertson is a 62 y.o. female who presents to the ED with a 3 week history of intermittent palpitations.  Symptoms are associated with the feeling of "needing to cough".  Have been worse and even causing chest pain over the past couple of days.  Had been referred to cards as outpatient but does not feel that she can wait to see them so she presented to ED.  Not really any association with activity, not really causing SOB, no modifying factors.  Patient is symptom free at this time.  Review of Systems: 12 systems reviewed and otherwise negative.  Past Medical History  Diagnosis Date  . Diabetes mellitus without complication   . Ectopic pregnancy    Past Surgical History  Procedure Laterality Date  . Tubal ligation     Social History:  reports that she has never smoked. She does not have any smokeless tobacco history on file. She reports that she does not drink alcohol or use illicit drugs.   No Known Allergies  No family history on file.  Prior to Admission medications   Medication Sig Start Date End Date Taking? Authorizing Provider  Ascorbic Acid (VITAMIN C PO) Take 1 tablet by mouth as needed.   Yes Historical Provider, MD  metFORMIN (GLUCOPHAGE) 500 MG tablet Take 1 tablet (500 mg total) by mouth 2 (two) times daily with a meal. 09/01/13  Yes Olivia Mackie, MD   Physical Exam: Filed Vitals:   09/02/13 1901  BP: 137/75  Pulse: 68  Temp: 98 F (36.7 C)  Resp: 18    General:  NAD, resting comfortably in bed Eyes: PEERLA EOMI ENT: mucous membranes moist Neck: supple w/o JVD Cardiovascular: RRR ? 2/6 murmur Respiratory: CTA B Abdomen: soft, nt, nd, bs+ Skin: no rash nor lesion Musculoskeletal: MAE, full ROM all 4 extremities Psychiatric: normal tone and affect Neurologic:  AAOx3, grossly non-focal  Labs on Admission:  Basic Metabolic Panel:  Recent Labs Lab 09/01/13 0050 09/02/13 1637  NA 136 139  K 4.2 4.3  CL 100 102  CO2 21 26  GLUCOSE 317* 129*  BUN 13 13  CREATININE 0.71 0.74  CALCIUM 9.4 9.6   Liver Function Tests: No results found for this basename: AST, ALT, ALKPHOS, BILITOT, PROT, ALBUMIN,  in the last 168 hours No results found for this basename: LIPASE, AMYLASE,  in the last 168 hours No results found for this basename: AMMONIA,  in the last 168 hours CBC:  Recent Labs Lab 09/01/13 0050 09/02/13 1637  WBC 7.4 8.1  HGB 10.7* 11.8*  HCT 31.7* 35.7*  MCV 83.9 84.6  PLT 286 305   Cardiac Enzymes: No results found for this basename: CKTOTAL, CKMB, CKMBINDEX, TROPONINI,  in the last 168 hours  BNP (last 3 results)  Recent Labs  09/01/13 0050  PROBNP <5.0   CBG:  Recent Labs Lab 09/01/13 0118  GLUCAP 282*    Radiological Exams on Admission: Dg Chest 2 View  09/01/2013   *RADIOLOGY REPORT*  Clinical Data: Chest pain with palpitations and cough.  CHEST - 2 VIEW  Comparison: CT of heart November 12, 2012.  Findings: The cardiomediastinal silhouette is unremarkable.  The lungs are clear without pleural effusions or focal consolidations. The pulmonary vasculature is unremarkable.   Trachea projects midline and there is no pneumothorax.  The included soft tissue  planes and osseous structures are unremarkable.  IMPRESSION: No acute cardiopulmonary process.   Original Report Authenticated By: Awilda Metro    EKG: Independently reviewed.  Assessment/Plan Principal Problem:   Palpitations Active Problems:   DIABETES MELLITUS, TYPE II   1. Palpitations - putting patient on chest pain obs protocol at this time, tele monitor, 2d echo ordered as well.  Heart score is a 3 at most, does likely need cards eval and follow up for the palpitations and possibly an event monitor if she continues to be symptom free while here.   Repeat EKG if she develops palpitations while here. Serial trops ordered. 2. DM 2- continue metformin while inpatient    Code Status: Full Code (must indicate code status--if unknown or must be presumed, indicate so) Family Communication: Spoke with family at bedside (indicate person spoken with, if applicable, with phone number if by telephone) Disposition Plan: Admit to obs (indicate anticipated LOS)  Time spent: 50 min  Thuan Tippett M. [facility] Pager [phone]  If 7PM-7AM, please contact night-coverage [email or web address] Password [email or web address] 09/02/2013, 8:00 PM

## 2013-09-02 NOTE — ED Notes (Signed)
Pt states she has been having palpitations and chest pain for the past couple days.  Pt states she feels the need to cough when she is having them

## 2013-09-02 NOTE — ED Provider Notes (Signed)
CSN: [id number]     Arrival date & time 09/02/13  1622 History   First MD Initiated Contact with Patient 09/02/13 1917     Chief Complaint  Patient presents with  . Palpitations   (Consider location/radiation/quality/duration/timing/severity/associated sxs/prior Treatment) HPI Comments: 62 year old female with 3 weeks of intermittent palpitations presents with new chest pain today. She states the chest pain is a stabbing type pain all over her chest. It comes and goes. Most recent episode was while she was in the waiting room. Did not seem to be any associated symptoms of shortness of breath, dizziness or nausea. Has not coincided with the palpitations. She seen here a few days ago with palpitations and had labs, EKG, chest x-ray and was given outpatient followup. At that time she did not have chest pain like this though. She's never had any heart disease that she knows of.   Past Medical History  Diagnosis Date  . Diabetes mellitus without complication   . Ectopic pregnancy    Past Surgical History  Procedure Laterality Date  . Tubal ligation     No family history on file. History  Substance Use Topics  . Smoking status: Never Smoker   . Smokeless tobacco: Not on file  . Alcohol Use: No   OB History   Grav Para Term Preterm Abortions TAB SAB Ect Mult Living                 Review of Systems  Constitutional: Negative for fever and chills.  Respiratory: Negative for cough and shortness of breath.   Cardiovascular: Positive for chest pain and palpitations. Negative for leg swelling.  Gastrointestinal: Negative for vomiting and abdominal pain.  All other systems reviewed and are negative.    Allergies  Review of patient's allergies indicates no known allergies.  Home Medications   Current Outpatient Rx  Name  Route  Sig  Dispense  Refill  . Ascorbic Acid (VITAMIN C PO)   Oral   Take 1 tablet by mouth as needed.         . metFORMIN (GLUCOPHAGE) 500 MG tablet    Oral   Take 1 tablet (500 mg total) by mouth 2 (two) times daily with a meal.   60 tablet   0    BP 136/73  Pulse 74  Temp(Src) 98.9 F (37.2 C) (Oral)  Resp 18  Wt 144 lb 4.8 oz (65.454 kg)  BMI 26.39 kg/m2  SpO2 100% Physical Exam  Nursing note and vitals reviewed. Constitutional: She is oriented to person, place, and time. She appears well-developed and well-nourished.  HENT:  Head: Normocephalic and atraumatic.  Right Ear: External ear normal.  Left Ear: External ear normal.  Nose: Nose normal.  Eyes: Right eye exhibits no discharge. Left eye exhibits no discharge.  Cardiovascular: Normal rate, regular rhythm and normal heart sounds.   Pulmonary/Chest: Effort normal and breath sounds normal.  Abdominal: Soft. There is no tenderness.  Musculoskeletal: She exhibits no edema and no tenderness.  Neurological: She is alert and oriented to person, place, and time.  Skin: Skin is warm and dry.    ED Course  Procedures (including critical care time) Labs Review Labs Reviewed  CBC - Abnormal; Notable for the following:    Hemoglobin 11.8 (*)    HCT 35.7 (*)    All other components within normal limits  BASIC METABOLIC PANEL - Abnormal; Notable for the following:    Glucose, Bld 129 (*)    All other components  within normal limits  POCT I-STAT TROPONIN I   Imaging Review Dg Chest 2 View  09/01/2013   *RADIOLOGY REPORT*  Clinical Data: Chest pain with palpitations and cough.  CHEST - 2 VIEW  Comparison: CT of heart November 12, 2012.  Findings: The cardiomediastinal silhouette is unremarkable.  The lungs are clear without pleural effusions or focal consolidations. The pulmonary vasculature is unremarkable.   Trachea projects midline and there is no pneumothorax.  The included soft tissue planes and osseous structures are unremarkable.  IMPRESSION: No acute cardiopulmonary process.   Original Report Authenticated By: Awilda Metro   Dg Chest Port 1 View  09/02/2013    CLINICAL DATA:  Chest pain.  EXAM: PORTABLE CHEST - 1 VIEW  COMPARISON:  09/01/2013.  FINDINGS: The heart size and mediastinal contours are within normal limits. Both lungs are clear. The visualized skeletal structures are unremarkable. Mild aortic vascular calcification. No change from priors.  IMPRESSION: No active disease.   Electronically Signed   By: Davonna Belling M.D.   On: 09/02/2013 20:01    EKG Interpretation     Ventricular Rate:    PR Interval:    QRS Duration:   QT Interval:    QTC Calculation:   R Axis:     Text Interpretation:              MDM   1. Palpitations   2. Type II or unspecified type diabetes mellitus without mention of complication, not stated as uncontrolled    HDS here, no tachycardia or signs of arrhythmia. With her new onset atyipcal chest pain and risk factors (DM, age), will admit to hospitalist under observation for ACS r/o and tele monitoring. No current chest pain in ED.     Audree Camel, MD 09/02/13 [phone]

## 2013-09-03 DIAGNOSIS — E119 Type 2 diabetes mellitus without complications: Secondary | ICD-10-CM | POA: Diagnosis present

## 2013-09-03 DIAGNOSIS — R072 Precordial pain: Secondary | ICD-10-CM

## 2013-09-03 DIAGNOSIS — IMO0001 Reserved for inherently not codable concepts without codable children: Secondary | ICD-10-CM

## 2013-09-03 LAB — TROPONIN I
Troponin I: <0.3 ng/mL (ref <0.30)
Troponin I: <0.3 ng/mL (ref <0.30)

## 2013-09-03 LAB — GLUCOSE, CAPILLARY: Glucose-Capillary: 150 mg/dL — ABNORMAL HIGH (ref 70–99)

## 2013-09-03 NOTE — Progress Notes (Signed)
  Echocardiogram 2D Echocardiogram has been performed.  Michelle Robertson 09/03/2013, 9:23 AM

## 2013-09-03 NOTE — Discharge Summary (Signed)
Physician Discharge Summary  Michelle Robertson ZOX:[id number] DOB: 1951-04-02 DOA: 09/02/2013  PCP: Michelle Prom, NP  Admit date: 09/02/2013 Discharge date: 09/03/2013  Recommendations for Outpatient Follow-up:  1. Have referred her to [facility] to arrange event monitor  Discharge Diagnoses:  Principal Problem:   Palpitations Active Problems:   DIABETES MELLITUS, TYPE II, uncontrolled   Discharge Condition: stable  Filed Weights   09/02/13 1629 09/02/13 2103  Weight: 65.454 kg (144 lb 4.8 oz) 64.592 kg (142 lb 6.4 oz)    History of present illness:  Michelle Robertson is a 62 y.o. female who presents to the ED with a 3 week history of intermittent palpitations. Symptoms are associated with the feeling of "needing to cough". Have been worse and even causing chest pain over the past couple of days. Had been referred to cards as outpatient but does not feel that she can wait to see them so she presented to ED. Not really any association with activity, not really causing SOB, no modifying factors. Patient is symptom free at this time.   Hospital Course:  Patient was observed on telemetry where she remained in normal sinus rhythm and no return of palpitations or chest pain. She ruled out for MI. Echocardiogram results as below. She reports that she was just recently started back on metformin and never checks blood glucose. Outpatient referral to cardiology for event monitor has been arranged. They will call the patient with the appointment time and date. Have also recommended she return to the clinic for diabetes management.  Procedures:  none  Consultations:  none  Discharge Exam: Filed Vitals:   09/03/13 1340  BP: 118/67  Pulse: 69  Temp: 98.3 F (36.8 C)  Resp: 20    General: comfortable Cardiovascular: RRR without MGR Respiratory: cTA without WRR  Discharge Instructions     Medication List         metFORMIN 500 MG tablet  Commonly known as:  GLUCOPHAGE   Take 1 tablet (500 mg total) by mouth 2 (two) times daily with a meal.     VITAMIN C PO  Take 1 tablet by mouth as needed.       No Known Allergies     Follow-up Information   Follow up with MARTIN,NYKEDTRA, NP.   Specialty:  Nurse Practitioner   Contact information:   [facility] [address] [place] [address] [phone]        The results of significant diagnostics from this hospitalization (including imaging, microbiology, ancillary and laboratory) are listed below for reference.    Significant Diagnostic Studies: Dg Chest 2 View  09/01/2013   *RADIOLOGY REPORT*  Clinical Data: Chest pain with palpitations and cough.  CHEST - 2 VIEW  Comparison: CT of heart November 12, 2012.  Findings: The cardiomediastinal silhouette is unremarkable.  The lungs are clear without pleural effusions or focal consolidations. The pulmonary vasculature is unremarkable.   Trachea projects midline and there is no pneumothorax.  The included soft tissue planes and osseous structures are unremarkable.  IMPRESSION: No acute cardiopulmonary process.   Original Report Authenticated By: Awilda Metro   Dg Chest Port 1 View  09/02/2013   CLINICAL DATA:  Chest pain.  EXAM: PORTABLE CHEST - 1 VIEW  COMPARISON:  09/01/2013.  FINDINGS: The heart size and mediastinal contours are within normal limits. Both lungs are clear. The visualized skeletal structures are unremarkable. Mild aortic vascular calcification. No change from priors.  IMPRESSION: No active disease.   Electronically Signed  By: Davonna Belling M.D.   On: 09/02/2013 20:01    Microbiology: No results found for this or any previous visit (from the past 240 hour(s)).   Labs: Basic Metabolic Panel:  Recent Labs Lab 09/01/13 0050 09/02/13 1637  NA 136 139  K 4.2 4.3  CL 100 102  CO2 21 26  GLUCOSE 317* 129*  BUN 13 13  CREATININE 0.71 0.74  CALCIUM 9.4 9.6   Liver Function Tests: No results found for this  basename: AST, ALT, ALKPHOS, BILITOT, PROT, ALBUMIN,  in the last 168 hours No results found for this basename: LIPASE, AMYLASE,  in the last 168 hours No results found for this basename: AMMONIA,  in the last 168 hours CBC:  Recent Labs Lab 09/01/13 0050 09/02/13 1637  WBC 7.4 8.1  HGB 10.7* 11.8*  HCT 31.7* 35.7*  MCV 83.9 84.6  PLT 286 305   Cardiac Enzymes:  Recent Labs Lab 09/03/13 0534 09/03/13 1120  TROPONINI <0.30 <0.30   BNP: BNP (last 3 results)  Recent Labs  09/01/13 0050  PROBNP <5.0   CBG:  Recent Labs Lab 09/01/13 0118 09/02/13 2309  GLUCAP 282* 169*   Echo: Left ventricle: The cavity size was normal. Systolic function was vigorous. The estimated ejection fraction was in the range of 65% to 70%. Wall motion was normal; there were no regional wall motion abnormalities. Left ventricular diastolic function parameters were normal.  EKG NSR   Signed:  Jahmir Salo L  [facility] 09/03/2013, 3:52 PM

## 2013-09-03 NOTE — Progress Notes (Signed)
Patient arrived on unit from ED.  Telemetry placed per MD order.  Will continue to monitor.

## 2013-09-03 NOTE — Progress Notes (Signed)
Utilization review completed.  

## 2014-03-28 ENCOUNTER — Emergency Department (HOSPITAL_COMMUNITY)
Admission: EM | Admit: 2014-03-28 | Discharge: 2014-03-28 | Disposition: A | Discharge status: Home / Self Care | Payer: Self-pay | Attending: Emergency Medicine | Admitting: Emergency Medicine

## 2014-03-28 ENCOUNTER — Encounter (HOSPITAL_COMMUNITY): Payer: Self-pay | Admitting: Emergency Medicine

## 2014-03-28 ENCOUNTER — Emergency Department (HOSPITAL_COMMUNITY): Payer: Self-pay

## 2014-03-28 DIAGNOSIS — R739 Hyperglycemia, unspecified: Secondary | ICD-10-CM

## 2014-03-28 DIAGNOSIS — I10 Essential (primary) hypertension: Secondary | ICD-10-CM | POA: Insufficient documentation

## 2014-03-28 DIAGNOSIS — E119 Type 2 diabetes mellitus without complications: Secondary | ICD-10-CM | POA: Insufficient documentation

## 2014-03-28 DIAGNOSIS — Z79899 Other long term (current) drug therapy: Secondary | ICD-10-CM | POA: Insufficient documentation

## 2014-03-28 LAB — URINALYSIS, ROUTINE W REFLEX MICROSCOPIC
Bilirubin Urine: NEGATIVE
GLUCOSE, UA: NEGATIVE mg/dL
Hgb urine dipstick: NEGATIVE
Ketones, ur: NEGATIVE mg/dL
LEUKOCYTES UA: NEGATIVE
Nitrite: NEGATIVE
PH: 5.5 (ref 5.0–8.0)
Protein, ur: NEGATIVE mg/dL
Specific Gravity, Urine: 1.025 (ref 1.005–1.030)
Urobilinogen, UA: 1 mg/dL (ref 0.0–1.0)

## 2014-03-28 LAB — CBC
HCT: 35 % — ABNORMAL LOW (ref 36.0–46.0)
HEMOGLOBIN: 11.5 g/dL — AB (ref 12.0–15.0)
MCH: 28.3 pg (ref 26.0–34.0)
MCHC: 32.9 g/dL (ref 30.0–36.0)
MCV: 86.2 fL (ref 78.0–100.0)
Platelets: 297 10*3/uL (ref 150–400)
RBC: 4.06 MIL/uL (ref 3.87–5.11)
RDW: 13.2 % (ref 11.5–15.5)
WBC: 6 10*3/uL (ref 4.0–10.5)

## 2014-03-28 LAB — BASIC METABOLIC PANEL
BUN: 12 mg/dL (ref 6–23)
CHLORIDE: 102 meq/L (ref 96–112)
CO2: 24 meq/L (ref 19–32)
CREATININE: 0.76 mg/dL (ref 0.50–1.10)
Calcium: 9.5 mg/dL (ref 8.4–10.5)
GFR calc Af Amer: >90 mL/min (ref >90)
GFR calc non Af Amer: 90 mL/min — ABNORMAL LOW (ref >90)
Glucose, Bld: 190 mg/dL — ABNORMAL HIGH (ref 70–99)
Potassium: 4 mEq/L (ref 3.7–5.3)
SODIUM: 138 meq/L (ref 137–147)

## 2014-03-28 MED ORDER — METFORMIN HCL 500 MG PO TABS: 500.0000 mg | ORAL_TABLET | Freq: Two times a day (BID) | ORAL | Status: DC

## 2014-03-28 NOTE — ED Notes (Signed)
MD at bedside. 

## 2014-03-28 NOTE — ED Notes (Signed)
Pt presents with dizziness x1 week and her BP was elevated yesterday while at [facility]. Pt states her head feels like "it's vibrating."

## 2014-03-28 NOTE — Discharge Instructions (Signed)
Return to the ED with any concerns including difficulty breathing, chest pain, weakness of arms or legs, changes in vision or speech, fainting, decreased level of alertness/lethargy, or any other alarming symptoms

## 2014-03-28 NOTE — ED Notes (Signed)
Patient transported to CT 

## 2014-03-28 NOTE — ED Notes (Signed)
Pt discharged home with all belongings, pt alert, oriented and ambulatory upon discharge, 1 new RX prescribed, pt and family verbalizes understanding of discharge instructions, pt driven home by family

## 2014-03-28 NOTE — ED Provider Notes (Signed)
CSN: [id number]     Arrival date & time 03/28/14  0701 History   First MD Initiated Contact with Patient 03/28/14 [id number]     Chief Complaint  Patient presents with  . Dizziness     (Consider location/radiation/quality/duration/timing/severity/associated sxs/prior Treatment) HPI Pt presents with c/o concern for high blood pressure.  She states she has been feeling tired for the past several weeks.  Also feels a vibrating sensation in her head.  No fainting.  No near syncope.  No vertigo or sensation of movement.  No  Headache.  No weakness or numbness of extremities.  She was seen at [facility] and blood pressure was 170/40.  No chest pain or shortness of breath.  No leg swelling.  She does not have hx of HTN and does not take any meds for this.  There are no other associated systemic symptoms, there are no other alleviating or modifying factors.   Past Medical History  Diagnosis Date  . Diabetes mellitus without complication   . Ectopic pregnancy    Past Surgical History  Procedure Laterality Date  . Tubal ligation     History reviewed. No pertinent family history. History  Substance Use Topics  . Smoking status: Never Smoker   . Smokeless tobacco: Never Used  . Alcohol Use: No   OB History   Grav Para Term Preterm Abortions TAB SAB Ect Mult Living                 Review of Systems ROS reviewed and all otherwise negative except for mentioned in HPI    Allergies  Review of patient's allergies indicates no known allergies.  Home Medications   Prior to Admission medications   Medication Sig Start Date End Date Taking? Authorizing Provider  Ascorbic Acid (VITAMIN C PO) Take 1 tablet by mouth as needed.    Historical Provider, MD  metFORMIN (GLUCOPHAGE) 500 MG tablet Take 1 tablet (500 mg total) by mouth 2 (two) times daily with a meal. 09/01/13   Kalman Drape, MD   BP 113/68  Pulse 71  Temp(Src) 97.7 F (36.5 C) (Oral)  Resp 11  SpO2 100% Vitals reviewed Physical  Exam Physical Examination: General appearance - alert, well appearing, and in no distress Mental status - alert, oriented to person, place, and time Eyes - pupils equal and reactive, extraocular eye movements intact Mouth - mucous membranes moist, pharynx normal without lesions Chest - clear to auscultation, no wheezes, rales or rhonchi, symmetric air entry Heart - normal rate, regular rhythm, normal S1, S2, no murmurs, rubs, clicks or gallops Abdomen - soft, nontender, nondistended, no masses or organomegaly Neurological - alert, oriented x3, cranial nerves 2-12 tested and intact, strength 5/5 in extremities x 4, sensation intact Extremities - peripheral pulses normal, no pedal edema, no clubbing or cyanosis Skin - normal coloration and turgor, no rashes  ED Course  Procedures (including critical care time) Labs Review Labs Reviewed  CBC - Abnormal; Notable for the following:    Hemoglobin 11.5 (*)    HCT 35.0 (*)    All other components within normal limits  BASIC METABOLIC PANEL - Abnormal; Notable for the following:    Glucose, Bld 190 (*)    GFR calc non Af Amer 90 (*)    All other components within normal limits  URINALYSIS, ROUTINE W REFLEX MICROSCOPIC - Abnormal; Notable for the following:    APPearance HAZY (*)    All other components within normal limits    Imaging  Review Ct Head Wo Contrast  03/28/2014   CLINICAL DATA:  Dizziness for 1 week, hypertension  EXAM: CT HEAD WITHOUT CONTRAST  TECHNIQUE: Contiguous axial images were obtained from the base of the skull through the vertex without intravenous contrast.  COMPARISON:  CT HEAD W/O CM dated 02/07/2008  FINDINGS: No mass lesion. No midline shift. No acute hemorrhage or hematoma. No extra-axial fluid collections. No evidence of acute infarction. Calvarium is intact with no significant inflammatory change in the visualized portions of the sinuses.  IMPRESSION: No acute findings   Electronically Signed   By: Skipper Cliche  M.D.   On: 03/28/2014 08:14     EKG Interpretation   Date/Time:  Sunday Mar 28 2014 07:34:46 EDT Ventricular Rate:  69 PR Interval:  170 QRS Duration: 72 QT Interval:  399 QTC Calculation: 427 R Axis:   29 Text Interpretation:  Sinus rhythm Borderline T wave abnormalities No  significant change since last tracing Confirmed by [facility]  MD, Vikram Tillett  [phone]) on 03/28/2014 10:18:45 AM      MDM   Final diagnoses:  Hypertension  Hyperglycemia    Pt presenting with c/o blood pressure being elevated and feeling fatigued with a lightheaded sensation for the past several days.  No signs of end organ damage.  Pt requesting to be started on po BP meds.  I have discussed with her that this would need to be done in an outpatient settting where her blood pressure could be monitored and adjustments made.  I would be concerned for dropping her bp too low.  I have refilled her metformin prescription.  [facility]  Discharged with strict return precautions.  Pt agreeable with plan.    Threasa Beards, MD 03/28/14 1018

## 2014-12-20 ENCOUNTER — Ambulatory Visit: Payer: Self-pay | Admitting: Internal Medicine

## 2015-01-10 ENCOUNTER — Encounter (HOSPITAL_COMMUNITY): Payer: Self-pay | Admitting: Emergency Medicine

## 2015-01-10 ENCOUNTER — Emergency Department (HOSPITAL_COMMUNITY): Payer: 59

## 2015-01-10 ENCOUNTER — Emergency Department (HOSPITAL_COMMUNITY)
Admission: EM | Admit: 2015-01-10 | Discharge: 2015-01-10 | Disposition: A | Discharge status: Home / Self Care | Payer: 59 | Attending: Emergency Medicine | Admitting: Emergency Medicine

## 2015-01-10 DIAGNOSIS — E119 Type 2 diabetes mellitus without complications: Secondary | ICD-10-CM | POA: Insufficient documentation

## 2015-01-10 DIAGNOSIS — Z79899 Other long term (current) drug therapy: Secondary | ICD-10-CM | POA: Insufficient documentation

## 2015-01-10 DIAGNOSIS — F419 Anxiety disorder, unspecified: Secondary | ICD-10-CM | POA: Diagnosis not present

## 2015-01-10 DIAGNOSIS — R002 Palpitations: Secondary | ICD-10-CM | POA: Insufficient documentation

## 2015-01-10 DIAGNOSIS — R531 Weakness: Secondary | ICD-10-CM | POA: Diagnosis not present

## 2015-01-10 LAB — CBC WITH DIFFERENTIAL/PLATELET
BASOS ABS: 0 10*3/uL (ref 0.0–0.1)
Basophils Relative: 1 % (ref 0–1)
EOS PCT: 2 % (ref 0–5)
Eosinophils Absolute: 0.1 10*3/uL (ref 0.0–0.7)
HCT: 34.2 % — ABNORMAL LOW (ref 36.0–46.0)
Hemoglobin: 11.3 g/dL — ABNORMAL LOW (ref 12.0–15.0)
LYMPHS PCT: 46 % (ref 12–46)
Lymphs Abs: 2.9 10*3/uL (ref 0.7–4.0)
MCH: 27.5 pg (ref 26.0–34.0)
MCHC: 33 g/dL (ref 30.0–36.0)
MCV: 83.2 fL (ref 78.0–100.0)
Monocytes Absolute: 0.3 10*3/uL (ref 0.1–1.0)
Monocytes Relative: 5 % (ref 3–12)
NEUTROS ABS: 2.9 10*3/uL (ref 1.7–7.7)
Neutrophils Relative %: 46 % (ref 43–77)
Platelets: 273 10*3/uL (ref 150–400)
RBC: 4.11 MIL/uL (ref 3.87–5.11)
RDW: 13.2 % (ref 11.5–15.5)
WBC: 6.2 10*3/uL (ref 4.0–10.5)

## 2015-01-10 LAB — COMPREHENSIVE METABOLIC PANEL
ALBUMIN: 3.9 g/dL (ref 3.5–5.2)
ALT: 12 U/L (ref 0–35)
ANION GAP: 6 (ref 5–15)
AST: 16 U/L (ref 0–37)
Alkaline Phosphatase: 71 U/L (ref 39–117)
BILIRUBIN TOTAL: 0.7 mg/dL (ref 0.3–1.2)
BUN: 14 mg/dL (ref 6–23)
CHLORIDE: 105 mmol/L (ref 96–112)
CO2: 23 mmol/L (ref 19–32)
CREATININE: 0.89 mg/dL (ref 0.50–1.10)
Calcium: 9.1 mg/dL (ref 8.4–10.5)
GFR calc Af Amer: 79 mL/min — ABNORMAL LOW (ref >90)
GFR calc non Af Amer: 69 mL/min — ABNORMAL LOW (ref >90)
Glucose, Bld: 256 mg/dL — ABNORMAL HIGH (ref 70–99)
Potassium: 4.3 mmol/L (ref 3.5–5.1)
Sodium: 134 mmol/L — ABNORMAL LOW (ref 135–145)
Total Protein: 7 g/dL (ref 6.0–8.3)

## 2015-01-10 LAB — I-STAT TROPONIN, ED: TROPONIN I, POC: 0 ng/mL (ref 0.00–0.08)

## 2015-01-10 NOTE — ED Provider Notes (Signed)
CSN: [id number]     Arrival date & time 01/10/15  0715 History   First MD Initiated Contact with Patient 01/10/15 0725     Chief Complaint  Patient presents with  . Weakness  . Hypertension     (Consider location/radiation/quality/duration/timing/severity/associated sxs/prior Treatment) The history is provided by the patient.  Michelle Robertson is a 64 y.o. female hx of DM here presenting with weakness. Has been having intermittent left arm weakness for the last several days. Also had some palpitations at night. Denies any chest pain or shortness of breath. Denies any numbness. Denies any slurred speech. She checked her blood pressure yesterday and was 171/88. She has no history of hypertension. She came to be evaluated. She was admitted in November last year and had nl echo and serial troponins were negative and remained in sinus rhythm the whole time.    Past Medical History  Diagnosis Date  . Diabetes mellitus without complication   . Ectopic pregnancy    Past Surgical History  Procedure Laterality Date  . Tubal ligation     No family history on file. History  Substance Use Topics  . Smoking status: Never Smoker   . Smokeless tobacco: Never Used  . Alcohol Use: No   OB History    No data available     Review of Systems  Cardiovascular: Positive for palpitations.  Neurological: Positive for weakness.  All other systems reviewed and are negative.     Allergies  Review of patient's allergies indicates no known allergies.  Home Medications   Prior to Admission medications   Medication Sig Start Date End Date Taking? Authorizing Provider  Naphazoline HCl (CLEAR EYES OP) Place 2 drops into both eyes as needed (for dry eyes).   Yes Historical Provider, MD  Tetrahydrozoline HCl (VISINE OP) Place 2 drops into both eyes as needed (for dry eyes).   Yes Historical Provider, MD  metFORMIN (GLUCOPHAGE) 500 MG tablet Take 1 tablet (500 mg total) by mouth 2 (two) times daily with  a meal. Patient not taking: Reported on 01/10/2015 03/28/14   Threasa Beards, MD   BP 130/80 mmHg  Pulse 71  Temp(Src) 98.5 F (36.9 C)  Resp 17  Ht 5' 2.5" (1.588 m)  Wt 148 lb (67.132 kg)  BMI 26.62 kg/m2  SpO2 98% Physical Exam  Constitutional: She is oriented to person, place, and time. She appears well-nourished.  Anxious   HENT:  Head: Normocephalic.  Mouth/Throat: Oropharynx is clear and moist.  Eyes: Conjunctivae and EOM are normal. Pupils are equal, round, and reactive to light.  Neck: Normal range of motion. Neck supple.  Cardiovascular: Normal rate, regular rhythm and normal heart sounds.   Pulmonary/Chest: Effort normal and breath sounds normal. No respiratory distress. She has no wheezes. She has no rales.  Abdominal: Soft. Bowel sounds are normal. She exhibits no distension. There is no tenderness. There is no rebound and no guarding.  Musculoskeletal: Normal range of motion. She exhibits no edema or tenderness.  Neurological: She is alert and oriented to person, place, and time.  CN 2-12 intact. Nl strength throughout. Nl finger to nose. Nl gait, no pronator drift. Neg rhomberg.   Skin: Skin is warm and dry.  Psychiatric: She has a normal mood and affect. Her behavior is normal. Thought content normal.  Nursing note and vitals reviewed.   ED Course  Procedures (including critical care time) Labs Review Labs Reviewed  CBC WITH DIFFERENTIAL/PLATELET - Abnormal; Notable for the following:  Hemoglobin 11.3 (*)    HCT 34.2 (*)    All other components within normal limits  COMPREHENSIVE METABOLIC PANEL - Abnormal; Notable for the following:    Sodium 134 (*)    Glucose, Bld 256 (*)    GFR calc non Af Amer 69 (*)    GFR calc Af Amer 79 (*)    All other components within normal limits  I-STAT TROPOININ, ED    Imaging Review Ct Head Wo Contrast  01/10/2015   CLINICAL DATA:  Two day history of left arm weakness.  Hypertension  EXAM: CT HEAD WITHOUT CONTRAST   TECHNIQUE: Contiguous axial images were obtained from the base of the skull through the vertex without intravenous contrast.  COMPARISON:  Mar 28, 2014  FINDINGS: The ventricles are normal in size and configuration. There is a cavum septum pellucidum, an anatomic variant. There is no intracranial mass, hemorrhage, extra-axial fluid collection, or midline shift. Gray-white compartments are normal. There is no demonstrable acute infarct. The bony calvarium appears intact. The mastoid air cells clear.  IMPRESSION: No intracranial mass, hemorrhage, or acute appearing infarct. No appreciable change from prior study.   Electronically Signed   By: Lowella Grip III M.D.   On: 01/10/2015 08:53     EKG Interpretation   Date/Time:  Monday January 10 2015 07:29:27 EST Ventricular Rate:  68 PR Interval:  169 QRS Duration: 74 QT Interval:  379 QTC Calculation: 403 R Axis:   36 Text Interpretation:  Sinus rhythm Baseline wander in lead(s) II aVR V2 V3  V4 V5 V6 No significant change since last tracing Confirmed by Ricki Clack  MD,  Areli Jowett ([id number]) on 01/10/2015 7:35:56 AM      MDM   Final diagnoses:  None   Michelle Robertson is a 64 y.o. female here with ? L arm weakness. BP normal bilateral arms here. No chest pain or shortness of breath. I doubt ACS or dissection. Not weak on my exam. Will get basic labs, CT head. If labs and CT neg, can get outpatient workup. Symptoms might be from anxiety.  9:15 AM Not hypertensive here. Labs and CT head unremarkable. Will dc home.    Wandra Arthurs, MD 01/10/15 [id number]

## 2015-01-10 NOTE — ED Notes (Signed)
Pt c/o left arm weakness for a couple days and had her blood pressure checked yesterday and it was 171/88. Pt not officially diagnosed with hypertension but has had high readings in the past.

## 2015-01-10 NOTE — Discharge Instructions (Signed)
Stay hydrated.   Check your blood pressure with your doctor in a week.   Return to ER if you have worse weakness, trouble walking, elevated blood pressure.

## 2015-03-15 ENCOUNTER — Encounter: Payer: Self-pay | Admitting: Internal Medicine

## 2015-03-15 ENCOUNTER — Other Ambulatory Visit (INDEPENDENT_AMBULATORY_CARE_PROVIDER_SITE_OTHER): Payer: 59

## 2015-03-15 ENCOUNTER — Ambulatory Visit (INDEPENDENT_AMBULATORY_CARE_PROVIDER_SITE_OTHER): Payer: 59 | Admitting: Internal Medicine

## 2015-03-15 VITALS — BP 138/88 | HR 82 | Temp 98.4°F | Resp 14 | Ht 62.0 in | Wt 139.0 lb

## 2015-03-15 DIAGNOSIS — M2010 Hallux valgus (acquired), unspecified foot: Secondary | ICD-10-CM | POA: Diagnosis not present

## 2015-03-15 DIAGNOSIS — E1165 Type 2 diabetes mellitus with hyperglycemia: Secondary | ICD-10-CM | POA: Diagnosis not present

## 2015-03-15 DIAGNOSIS — D649 Anemia, unspecified: Secondary | ICD-10-CM | POA: Diagnosis not present

## 2015-03-15 DIAGNOSIS — R7989 Other specified abnormal findings of blood chemistry: Secondary | ICD-10-CM

## 2015-03-15 DIAGNOSIS — E785 Hyperlipidemia, unspecified: Secondary | ICD-10-CM

## 2015-03-15 DIAGNOSIS — H8110 Benign paroxysmal vertigo, unspecified ear: Secondary | ICD-10-CM

## 2015-03-15 DIAGNOSIS — IMO0002 Reserved for concepts with insufficient information to code with codable children: Secondary | ICD-10-CM

## 2015-03-15 LAB — COMPREHENSIVE METABOLIC PANEL
ALT: 11 U/L (ref 0–35)
AST: 13 U/L (ref 0–37)
Albumin: 4.4 g/dL (ref 3.5–5.2)
Alkaline Phosphatase: 82 U/L (ref 39–117)
BILIRUBIN TOTAL: 0.4 mg/dL (ref 0.2–1.2)
BUN: 12 mg/dL (ref 6–23)
CALCIUM: 9.8 mg/dL (ref 8.4–10.5)
CHLORIDE: 103 meq/L (ref 96–112)
CO2: 27 meq/L (ref 19–32)
Creatinine, Ser: 0.83 mg/dL (ref 0.40–1.20)
GFR: 89.66 mL/min (ref >60.00)
GLUCOSE: 250 mg/dL — AB (ref 70–99)
Potassium: 4.4 mEq/L (ref 3.5–5.1)
Sodium: 137 mEq/L (ref 135–145)
TOTAL PROTEIN: 7.4 g/dL (ref 6.0–8.3)

## 2015-03-15 LAB — HEMOGLOBIN A1C: Hgb A1c MFr Bld: 11.8 % — ABNORMAL HIGH (ref 4.6–6.5)

## 2015-03-15 LAB — VITAMIN B12: VITAMIN B 12: 506 pg/mL (ref 211–911)

## 2015-03-15 LAB — CBC
HCT: 35.2 % — ABNORMAL LOW (ref 36.0–46.0)
HEMOGLOBIN: 12 g/dL (ref 12.0–15.0)
MCHC: 34.1 g/dL (ref 30.0–36.0)
MCV: 83.8 fl (ref 78.0–100.0)
PLATELETS: 301 10*3/uL (ref 150.0–400.0)
RBC: 4.2 Mil/uL (ref 3.87–5.11)
RDW: 14 % (ref 11.5–15.5)
WBC: 5.6 10*3/uL (ref 4.0–10.5)

## 2015-03-15 LAB — LIPID PANEL
CHOLESTEROL: 255 mg/dL — AB (ref 0–200)
HDL: 42.1 mg/dL (ref >39.00)
NonHDL: 212.9
Total CHOL/HDL Ratio: 6
Triglycerides: 231 mg/dL — ABNORMAL HIGH (ref 0.0–149.0)
VLDL: 46.2 mg/dL — ABNORMAL HIGH (ref 0.0–40.0)

## 2015-03-15 LAB — FERRITIN: Ferritin: 46.8 ng/mL (ref 10.0–291.0)

## 2015-03-15 LAB — LDL CHOLESTEROL, DIRECT: Direct LDL: 164 mg/dL

## 2015-03-15 LAB — FOLATE: Folate: 17.2 ng/mL (ref >5.9)

## 2015-03-15 NOTE — Patient Instructions (Addendum)
We have given you an information sheet about the dizziness. If you try to do the exercises twice a day for 1-2 weeks it should clear up the problem. I have also sent in a medicine called meclizine which is for dizziness that you can take up to 3 times per day as needed for dizziness. We recommend to use it only when you are dizzy as taking it without dizziness can cause some people to become dizzy.   We will check on the labs today and call you back with the results. We may end up starting a medicine for the sugars. Metformin is usually the first choice and generally does not affect the kidneys in most people.  We would like to see you back in about 3 months to check on how you are doing.   Diabetes and Exercise Exercising regularly is important. It is not just about losing weight. It has many health benefits, such as:  Improving your overall fitness, flexibility, and endurance.  Increasing your bone density.  Helping with weight control.  Decreasing your body fat.  Increasing your muscle strength.  Reducing stress and tension.  Improving your overall health. People with diabetes who exercise gain additional benefits because exercise:  Reduces appetite.  Improves the body's use of blood sugar (glucose).  Helps lower or control blood glucose.  Decreases blood pressure.  Helps control blood lipids (such as cholesterol and triglycerides).  Improves the body's use of the hormone insulin by:  Increasing the body's insulin sensitivity.  Reducing the body's insulin needs.  Decreases the risk for heart disease because exercising:  Lowers cholesterol and triglycerides levels.  Increases the levels of good cholesterol (such as high-density lipoproteins [HDL]) in the body.  Lowers blood glucose levels. YOUR ACTIVITY PLAN  Choose an activity that you enjoy and set realistic goals. Your health care provider or diabetes educator can help you make an activity plan that works for you.  Exercise regularly as directed by your health care provider. This includes:  Performing resistance training twice a week such as push-ups, sit-ups, lifting weights, or using resistance bands.  Performing 150 minutes of cardio exercises each week such as walking, running, or playing sports.  Staying active and spending no more than 90 minutes at one time being inactive. Even short bursts of exercise are good for you. Three 10-minute sessions spread throughout the day are just as beneficial as a single 30-minute session. Some exercise ideas include:  Taking the dog for a walk.  Taking the stairs instead of the elevator.  Dancing to your favorite song.  Doing an exercise video.  Doing your favorite exercise with a friend. RECOMMENDATIONS FOR EXERCISING WITH TYPE 1 OR TYPE 2 DIABETES   Check your blood glucose before exercising. If blood glucose levels are greater than 240 mg/dL, check for urine ketones. Do not exercise if ketones are present.  Avoid injecting insulin into areas of the body that are going to be exercised. For example, avoid injecting insulin into:  The arms when playing tennis.  The legs when jogging.  Keep a record of:  Food intake before and after you exercise.  Expected peak times of insulin action.  Blood glucose levels before and after you exercise.  The type and amount of exercise you have done.  Review your records with your health care provider. Your health care provider will help you to develop guidelines for adjusting food intake and insulin amounts before and after exercising.  If you take insulin or  oral hypoglycemic agents, watch for signs and symptoms of hypoglycemia. They include:  Dizziness.  Shaking.  Sweating.  Chills.  Confusion.  Drink plenty of water while you exercise to prevent dehydration or heat stroke. Body water is lost during exercise and must be replaced.  Talk to your health care provider before starting an exercise  program to make sure it is safe for you. Remember, almost any type of activity is better than none. Document Released: 01/26/2004 Document Revised: 03/22/2014 Document Reviewed: 04/14/2013 [facility] Patient Information 2015 [place], [place]. This information is not intended to replace advice given to you by your health care provider. Make sure you discuss any questions you have with your health care provider.

## 2015-03-15 NOTE — Progress Notes (Signed)
Pre visit review using our clinic review tool, if applicable. No additional management support is needed unless otherwise documented below in the visit note. 

## 2015-03-16 ENCOUNTER — Telehealth: Payer: Self-pay | Admitting: Internal Medicine

## 2015-03-16 NOTE — Telephone Encounter (Signed)
Please call and find out why they require a chest x-ray. Has she had a positive TB skin test in the past? If not then likely she can just do a TB test at the office.

## 2015-03-16 NOTE — Telephone Encounter (Signed)
Patient was in yesterday and forgot to mention she needs a tb chest x-ray for her job. Please call patient regarding getting this done or she is on MyChart and can leave a message on there.

## 2015-03-16 NOTE — Telephone Encounter (Signed)
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

## 2015-03-17 ENCOUNTER — Other Ambulatory Visit: Payer: Self-pay | Admitting: Internal Medicine

## 2015-03-17 MED ORDER — METFORMIN HCL 500 MG PO TABS: 500.0000 mg | ORAL_TABLET | Freq: Two times a day (BID) | ORAL | Status: DC

## 2015-03-17 MED ORDER — SIMVASTATIN 40 MG PO TABS: 40.0000 mg | ORAL_TABLET | Freq: Every day | ORAL | Status: DC

## 2015-03-17 NOTE — Telephone Encounter (Signed)
Left message informing patient that we need to see the paperwork from her employer showing that she needs a cxr before Dr. Doug Sou will place orders.

## 2015-03-17 NOTE — Telephone Encounter (Signed)
Please have her bring in papers from her work to show that she needs it otherwise we may be exposing her to radiation from yearly chest x-rays for no reason.

## 2015-03-18 ENCOUNTER — Encounter: Payer: Self-pay | Admitting: Internal Medicine

## 2015-03-18 DIAGNOSIS — H8110 Benign paroxysmal vertigo, unspecified ear: Secondary | ICD-10-CM | POA: Insufficient documentation

## 2015-03-18 NOTE — Assessment & Plan Note (Addendum)
Currently not taking anything and has been diabetic in the past. Will check HgA1c and foot exam done today. Will try to get her up to date on her missing health markers. In order to limit new medication overwhelm for her will start medicine for her diabetes sugar control and hold on ACE-I for now. She declines immunizations today as she wants to think about them. Will follow her closely.

## 2015-03-18 NOTE — Assessment & Plan Note (Signed)
Ears normal on exam, given epley maneuver and talked with her about doing it at home to help with her symptoms. She will try them.

## 2015-03-18 NOTE — Assessment & Plan Note (Signed)
No skin breakdown on either foot, she also does not have toe deformity yet. No need for current intervention at this time.

## 2015-03-18 NOTE — Assessment & Plan Note (Signed)
Checking lipid panel today and start statin if needed. Her diet is not ideal.

## 2015-03-18 NOTE — Progress Notes (Signed)
   Subjective:    Patient ID: Michelle Robertson, female    DOB: 05-05-1951, 64 y.o.   MRN: [id number]  HPI The patient is a 64 YO female coming in to talk about her diabetes. She used to have diabetes and take medicines but she was not good about following up and did not try to get that refilled from several years ago. She has not had a PCP in some time. She denies blurred vision, abdominal pain. She does feel fatigued some times. Denies chest pains, nausea, vomiting, diarrhea. She denies numbness in her feet but she does have bunions on both feet which do not hurt her much. They limit what shoes she can fit into.  She has been having some mild dizziness in the last 3 months which is not always present but when she turns her head a certain way causes a way of the room spinning which lasts about 30-45 seconds. Has not tried anything for it but some days is better and some days worse. Overall not much change since it started.   Review of Systems  Constitutional: Positive for fatigue. Negative for fever, activity change, appetite change and unexpected weight change.  HENT: Negative.   Eyes: Negative.   Respiratory: Negative.   Cardiovascular: Negative.   Gastrointestinal: Negative.   Musculoskeletal: Negative.   Skin: Negative.   Neurological: Positive for dizziness. Negative for weakness, light-headedness, numbness and headaches.  Psychiatric/Behavioral: Negative.       Objective:   Physical Exam  Constitutional: She is oriented to person, place, and time. She appears well-developed and well-nourished.  HENT:  Head: Normocephalic and atraumatic.  Eyes: EOM are normal.  Neck: Normal range of motion.  Cardiovascular: Normal rate and regular rhythm.   Pulmonary/Chest: Effort normal and breath sounds normal. No respiratory distress. She has no wheezes. She has no rales.  Abdominal: Soft. Bowel sounds are normal. She exhibits no distension. There is no tenderness.  Musculoskeletal: She exhibits no  edema.  Neurological: She is alert and oriented to person, place, and time. Coordination normal.  Skin: Skin is warm and dry.  See foot exam   Filed Vitals:   03/15/15 0928 03/15/15 1006  BP: 152/94 138/88  Pulse: 82   Temp: 98.4 F (36.9 C)   TempSrc: Oral   Resp: 14   Height: 5\' 2"  (1.575 m)   Weight: 139 lb (63.05 kg)   SpO2: 99%       Assessment & Plan:

## 2015-04-06 ENCOUNTER — Other Ambulatory Visit: Payer: Self-pay | Admitting: Internal Medicine

## 2015-04-06 DIAGNOSIS — Z111 Encounter for screening for respiratory tuberculosis: Secondary | ICD-10-CM

## 2015-04-19 ENCOUNTER — Ambulatory Visit (INDEPENDENT_AMBULATORY_CARE_PROVIDER_SITE_OTHER)
Admission: RE | Admit: 2015-04-19 | Discharge: 2015-04-19 | Disposition: A | Discharge status: Home / Self Care | Payer: 59 | Source: Ambulatory Visit | Attending: Internal Medicine | Admitting: Internal Medicine

## 2015-04-19 DIAGNOSIS — Z111 Encounter for screening for respiratory tuberculosis: Secondary | ICD-10-CM | POA: Diagnosis not present

## 2015-04-20 ENCOUNTER — Telehealth: Payer: Self-pay | Admitting: Geriatric Medicine

## 2015-04-20 NOTE — Telephone Encounter (Signed)
Left message asking patient to call back to let me know if she has had a mammogram lately.

## 2015-04-21 NOTE — Telephone Encounter (Signed)
Pt called back said that she has not had a mammorgram for 2 or 3 years.

## 2015-04-25 ENCOUNTER — Telehealth: Payer: Self-pay | Admitting: Internal Medicine

## 2015-04-25 NOTE — Telephone Encounter (Signed)
Is requesting TB results to be faxed to employers at [phone] attn:  Don Perking and is requesting a copy for her records.  Patient is requesting to pick copy up for herself.

## 2015-04-26 NOTE — Telephone Encounter (Signed)
Results in imaging tab of chart review from 04/19/15.

## 2015-04-26 NOTE — Telephone Encounter (Signed)
I can not find the xray for this. Can you help?

## 2015-04-27 NOTE — Telephone Encounter (Signed)
Record has been faxed to employer and results mailed to pt address on file.

## 2015-05-03 ENCOUNTER — Telehealth: Payer: Self-pay | Admitting: Internal Medicine

## 2015-05-03 NOTE — Telephone Encounter (Signed)
I spoke with patient and cleared up her concerns.

## 2015-05-03 NOTE — Telephone Encounter (Signed)
Patient is requesting call back with Chest xray results. Patient states she received a letter in regards but does not understand what its stating.

## 2015-06-14 ENCOUNTER — Ambulatory Visit: Payer: 59 | Admitting: Internal Medicine

## 2015-06-30 ENCOUNTER — Ambulatory Visit: Payer: 59 | Admitting: Internal Medicine

## 2015-07-06 ENCOUNTER — Encounter (HOSPITAL_COMMUNITY): Payer: Self-pay | Admitting: Emergency Medicine

## 2015-07-06 ENCOUNTER — Emergency Department (INDEPENDENT_AMBULATORY_CARE_PROVIDER_SITE_OTHER)
Admission: EM | Admit: 2015-07-06 | Discharge: 2015-07-06 | Disposition: A | Discharge status: Home / Self Care | Payer: 59 | Source: Home / Self Care | Attending: Family Medicine | Admitting: Family Medicine

## 2015-07-06 DIAGNOSIS — I10 Essential (primary) hypertension: Secondary | ICD-10-CM | POA: Diagnosis not present

## 2015-07-06 DIAGNOSIS — K047 Periapical abscess without sinus: Secondary | ICD-10-CM | POA: Diagnosis not present

## 2015-07-06 MED ORDER — KETOROLAC TROMETHAMINE 30 MG/ML IJ SOLN
30.0000 mg | Freq: Once | INTRAMUSCULAR | Status: AC
  Administered 2015-07-06: 30 mg via INTRAMUSCULAR

## 2015-07-06 MED ORDER — AMOXICILLIN 875 MG PO TABS: 875.0000 mg | ORAL_TABLET | Freq: Two times a day (BID) | ORAL | Status: DC

## 2015-07-06 MED ORDER — TRAMADOL HCL 50 MG PO TABS: 50.0000 mg | ORAL_TABLET | Freq: Two times a day (BID) | ORAL | Status: DC

## 2015-07-06 MED ORDER — FLUCONAZOLE 150 MG PO TABS: 150.0000 mg | ORAL_TABLET | Freq: Every day | ORAL | Status: DC

## 2015-07-06 MED ORDER — KETOROLAC TROMETHAMINE 30 MG/ML IJ SOLN
INTRAMUSCULAR | Status: AC
  Filled 2015-07-06: qty 1

## 2015-07-06 NOTE — ED Notes (Signed)
C/o swollen bottom gums States she has two cavities on bottom gums States she are not able to use her dentures due to the pain No dentist appt Diona Fanti used as tx

## 2015-07-06 NOTE — Discharge Instructions (Signed)
You have a dental infection. Please take the anabolic was prescribed. Please use the tramadol for severe pain. Please try to not clean to vigorously Please follow up with the dentist

## 2015-07-06 NOTE — ED Provider Notes (Signed)
CSN: [id number]     Arrival date & time 07/06/15  1842 History   None    Chief Complaint  Patient presents with  . Dental Pain   (Consider location/radiation/quality/duration/timing/severity/associated sxs/prior Treatment) HPI  Dental pain. Started 2 days ago. Constant. Getting worse. Swelling of the gums. Denies any purulent or bloody discharge. Numerous dental caries throughout her mouth. Wears dentures but stopped doing this ever since pain started. Worse with eating or touching of the gums. Nothing makes her symptoms better. Denies any neck stiffness, fevers, headache, nausea, vomiting, diarrhea, constipation. Occasional gum bleeding to with brushing and eating.  Hypertension noted on patient's vital signs. States that this happens from time to time but has not been placed on any medications for it. Denies any chest pain or shortness of breath or palpitations.  Past Medical History  Diagnosis Date  . Diabetes mellitus without complication   . Ectopic pregnancy    Past Surgical History  Procedure Laterality Date  . Tubal ligation     Family History  Problem Relation Age of Onset  . Diabetes Mother   . Diabetes Maternal Aunt   . Diabetes Paternal Grandmother    Social History  Substance Use Topics  . Smoking status: Never Smoker   . Smokeless tobacco: Never Used  . Alcohol Use: No   OB History    No data available     Review of Systems Per HPI with all other pertinent systems negative.   Allergies  Review of patient's allergies indicates no known allergies.  Home Medications   Prior to Admission medications   Medication Sig Start Date End Date Taking? Authorizing Provider  amoxicillin (AMOXIL) 875 MG tablet Take 1 tablet (875 mg total) by mouth 2 (two) times daily. 07/06/15   Waldemar Dickens, MD  fluconazole (DIFLUCAN) 150 MG tablet Take 1 tablet (150 mg total) by mouth daily. Repeat dose in 3 days 07/06/15   Waldemar Dickens, MD  metFORMIN (GLUCOPHAGE) 500 MG tablet  Take 1 tablet (500 mg total) by mouth 2 (two) times daily with a meal. 03/17/15   Olga Millers, MD  Naphazoline HCl (CLEAR EYES OP) Place 2 drops into both eyes as needed (for dry eyes).    Historical Provider, MD  simvastatin (ZOCOR) 40 MG tablet Take 1 tablet (40 mg total) by mouth at bedtime. 03/17/15   Olga Millers, MD  Tetrahydrozoline HCl (VISINE OP) Place 2 drops into both eyes as needed (for dry eyes).    Historical Provider, MD  traMADol (ULTRAM) 50 MG tablet Take 1 tablet (50 mg total) by mouth 2 (two) times daily. 07/06/15   Waldemar Dickens, MD   BP 169/91 mmHg  Pulse 82  Temp(Src) 98 F (36.7 C) (Oral)  Resp 16  SpO2 97% Physical Exam Physical Exam  Constitutional: oriented to person, place, and time. appears well-developed and well-nourished. No distress.  HENT:  Head: Normocephalic and atraumatic.  Anterior gumline surrounding teeth swollen and tender throughout, no fluctuance, no significant erythema, numerous dental caries noted. Noted purulent discharge Eyes: EOMI. PERRL.  Neck: Normal range of motion.  Cardiovascular: RRR, no m/r/g, 2+ distal pulses,  Pulmonary/Chest: Effort normal and breath sounds normal. No respiratory distress.  Abdominal: Soft. Bowel sounds are normal. NonTTP, no distension.  Musculoskeletal: Normal range of motion. Non ttp, no effusion.  Neurological: alert and oriented to person, place, and time.  Skin: Skin is warm. No rash noted. non diaphoretic.  Psychiatric: normal mood and affect. behavior is  normal. Judgment and thought content normal.   ED Course  Procedures (including critical care time) Labs Review Labs Reviewed - No data to display  Imaging Review No results found.   MDM   1. Dental infection   2. Essential hypertension    Toradol 30 mg IM given in clinic, start NSAIDs and 24 hours, start amoxicillin 875 twice a day, decrease the amount of aggressive cleaning that she is undertaking as I believe there are some  chemical irritation of the skin with hallux mouthwash she is using. Follow-up with dentist. Tramadol for severe pain.  Attention noted. Follow-up with primary care physician.    Waldemar Dickens, MD 07/06/15 [phone]

## 2015-07-07 ENCOUNTER — Telehealth: Payer: Self-pay | Admitting: *Deleted

## 2015-07-07 MED ORDER — GLUCOSE BLOOD VI STRP: 1.0000 | ORAL_STRIP | Freq: Three times a day (TID) | Status: DC

## 2015-07-07 NOTE — Telephone Encounter (Signed)
Pt is requesting refill on her contour strips. Verified pharmacy inform pt will send to [facility]...Michelle Robertson

## 2015-07-19 ENCOUNTER — Ambulatory Visit (INDEPENDENT_AMBULATORY_CARE_PROVIDER_SITE_OTHER): Payer: 59 | Admitting: Internal Medicine

## 2015-07-19 ENCOUNTER — Encounter: Payer: Self-pay | Admitting: Internal Medicine

## 2015-07-19 ENCOUNTER — Ambulatory Visit (INDEPENDENT_AMBULATORY_CARE_PROVIDER_SITE_OTHER)
Admission: RE | Admit: 2015-07-19 | Discharge: 2015-07-19 | Disposition: A | Discharge status: Home / Self Care | Payer: 59 | Source: Ambulatory Visit | Attending: Internal Medicine | Admitting: Internal Medicine

## 2015-07-19 ENCOUNTER — Other Ambulatory Visit (INDEPENDENT_AMBULATORY_CARE_PROVIDER_SITE_OTHER): Payer: 59

## 2015-07-19 VITALS — BP 136/86 | HR 81 | Temp 98.5°F | Resp 16 | Ht 62.5 in | Wt 134.8 lb

## 2015-07-19 DIAGNOSIS — M25562 Pain in left knee: Secondary | ICD-10-CM | POA: Diagnosis not present

## 2015-07-19 DIAGNOSIS — E1165 Type 2 diabetes mellitus with hyperglycemia: Secondary | ICD-10-CM

## 2015-07-19 DIAGNOSIS — IMO0002 Reserved for concepts with insufficient information to code with codable children: Secondary | ICD-10-CM

## 2015-07-19 DIAGNOSIS — E785 Hyperlipidemia, unspecified: Secondary | ICD-10-CM

## 2015-07-19 LAB — COMPREHENSIVE METABOLIC PANEL
ALBUMIN: 4.5 g/dL (ref 3.5–5.2)
ALT: 9 U/L (ref 0–35)
AST: 13 U/L (ref 0–37)
Alkaline Phosphatase: 65 U/L (ref 39–117)
BILIRUBIN TOTAL: 0.3 mg/dL (ref 0.2–1.2)
BUN: 18 mg/dL (ref 6–23)
CALCIUM: 10 mg/dL (ref 8.4–10.5)
CO2: 26 meq/L (ref 19–32)
CREATININE: 0.91 mg/dL (ref 0.40–1.20)
Chloride: 105 mEq/L (ref 96–112)
GFR: 80.54 mL/min (ref >60.00)
Glucose, Bld: 115 mg/dL — ABNORMAL HIGH (ref 70–99)
Potassium: 3.8 mEq/L (ref 3.5–5.1)
SODIUM: 140 meq/L (ref 135–145)
Total Protein: 7.7 g/dL (ref 6.0–8.3)

## 2015-07-19 LAB — LDL CHOLESTEROL, DIRECT: LDL DIRECT: 134 mg/dL

## 2015-07-19 LAB — LIPID PANEL
Cholesterol: 231 mg/dL — ABNORMAL HIGH (ref 0–200)
HDL: 34.9 mg/dL — ABNORMAL LOW (ref >39.00)
Total CHOL/HDL Ratio: 7

## 2015-07-19 LAB — TSH: TSH: 3.49 u[IU]/mL (ref 0.35–4.50)

## 2015-07-19 LAB — HEMOGLOBIN A1C: Hgb A1c MFr Bld: 8 % — ABNORMAL HIGH (ref 4.6–6.5)

## 2015-07-19 MED ORDER — DICLOFENAC SODIUM 1 % TD GEL: 2.0000 g | Freq: Three times a day (TID) | TRANSDERMAL | Status: DC | PRN

## 2015-07-19 NOTE — Patient Instructions (Addendum)
We are going to check the x-ray today as well as the lab work to check on the thyroid and diabetes.   We have sent in a cream that you can use on the knee up to 3 times per day called voltaren gel.   Diabetes and Standards of Medical Care Diabetes is complicated. You may find that your diabetes team includes a dietitian, nurse, diabetes educator, eye doctor, and more. To help everyone know what is going on and to help you get the care you deserve, the following schedule of care was developed to help keep you on track. Below are the tests, exams, vaccines, medicines, education, and plans you will need. HbA1c test This test shows how well you have controlled your glucose over the past 2-3 months. It is used to see if your diabetes management plan needs to be adjusted.   It is performed at least 2 times a year if you are meeting treatment goals.  It is performed 4 times a year if therapy has changed or if you are not meeting treatment goals. Blood pressure test  This test is performed at every routine medical visit. The goal is less than 140/90 mm Hg for most people, but 130/80 mm Hg in some cases. Ask your health care provider about your goal. Dental exam  Follow up with the dentist regularly. Eye exam  If you are diagnosed with type 1 diabetes as a child, get an exam upon reaching the age of 53 years or older and have had diabetes for 3-5 years. Yearly eye exams are recommended after that initial eye exam.  If you are diagnosed with type 1 diabetes as an adult, get an exam within 5 years of diagnosis and then yearly.  If you are diagnosed with type 2 diabetes, get an exam as soon as possible after the diagnosis and then yearly. Foot care exam  Visual foot exams are performed at every routine medical visit. The exams check for cuts, injuries, or other problems with the feet.  A comprehensive foot exam should be done yearly. This includes visual inspection as well as assessing foot pulses  and testing for loss of sensation.  Check your feet nightly for cuts, injuries, or other problems with your feet. Tell your health care provider if anything is not healing. Kidney function test (urine microalbumin)  This test is performed once a year.  Type 1 diabetes: The first test is performed 5 years after diagnosis.  Type 2 diabetes: The first test is performed at the time of diagnosis.  A serum creatinine and estimated glomerular filtration rate (eGFR) test is done once a year to assess the level of chronic kidney disease (CKD), if present. Lipid profile (cholesterol, HDL, LDL, triglycerides)  Performed every 5 years for most people.  The goal for LDL is less than 100 mg/dL. If you are at high risk, the goal is less than 70 mg/dL.  The goal for HDL is 40 mg/dL-50 mg/dL for men and 50 mg/dL-60 mg/dL for women. An HDL cholesterol of 60 mg/dL or higher gives some protection against heart disease.  The goal for triglycerides is less than 150 mg/dL. Influenza vaccine, pneumococcal vaccine, and hepatitis B vaccine  The influenza vaccine is recommended yearly.  It is recommended that people with diabetes who are over 62 years old get the pneumonia vaccine. In some cases, two separate shots may be given. Ask your health care provider if your pneumonia vaccination is up to date.  The hepatitis B  vaccine is also recommended for adults with diabetes. Diabetes self-management education  Education is recommended at diagnosis and ongoing as needed. Treatment plan  Your treatment plan is reviewed at every medical visit. Document Released: 09/02/2009 Document Revised: 03/22/2014 Document Reviewed: 04/07/2013 [facility] Patient Information 2015 [place], [place]. This information is not intended to replace advice given to you by your health care provider. Make sure you discuss any questions you have with your health care provider.

## 2015-07-19 NOTE — Progress Notes (Signed)
Pre visit review using our clinic review tool, if applicable. No additional management support is needed unless otherwise documented below in the visit note. 

## 2015-07-20 DIAGNOSIS — M25562 Pain in left knee: Secondary | ICD-10-CM | POA: Insufficient documentation

## 2015-07-20 NOTE — Assessment & Plan Note (Signed)
Checking HgA1c but suspect she will need additional therapy. Likely will increase metformin to 1000 mg BID as she is not having side effects and add additional agent as well depending on her HgA1c. Is not on ACE-I or ARB and does not want to start a new medicine today. Will continue to work on that. Not taking her prescribed statin.

## 2015-07-20 NOTE — Assessment & Plan Note (Signed)
Did start statin after last visit but she did not tolerate and does not want to continue. Talked to her about starting a different medicine and she does not want to try that now. Talked to her about increasing exercise and decreasing fat in diet but that some of this could be genetic which we cannot change that way.

## 2015-07-20 NOTE — Progress Notes (Signed)
   Subjective:    Patient ID: Michelle Robertson, female    DOB: 06/15/1951, 64 y.o.   MRN: [id number]  HPI The patient is coming in for follow up of her uncontrolled diabetes as well as new problem of left knee pain. She is taking her medicine as prescribed and is working on her diet as well. Denies hypoglycemia or hyperglycemia symptoms.  Her left knee is hurting for the last month. Denies injury to it. Mostly during activity but can ache just laying down. No swelling. Does not collapse on her. No old injury or surgery to that knee. Works all day on her feet. Tried ibuprofen for pain which was moderately helpful. Pain is 6/10 at worst.   Review of Systems  Constitutional: Positive for fatigue. Negative for fever, activity change, appetite change and unexpected weight change.  HENT: Negative.   Eyes: Negative.   Respiratory: Negative.   Cardiovascular: Negative.   Gastrointestinal: Negative.   Musculoskeletal: Positive for arthralgias. Negative for myalgias, back pain and gait problem.  Skin: Negative.   Neurological: Negative for weakness, light-headedness, numbness and headaches.  Psychiatric/Behavioral: Negative.       Objective:   Physical Exam  Constitutional: She is oriented to person, place, and time. She appears well-developed and well-nourished.  HENT:  Head: Normocephalic and atraumatic.  Eyes: EOM are normal.  Neck: Normal range of motion.  Cardiovascular: Normal rate and regular rhythm.   Pulmonary/Chest: Effort normal and breath sounds normal. No respiratory distress. She has no wheezes. She has no rales.  Abdominal: Soft. Bowel sounds are normal. She exhibits no distension. There is no tenderness.  Musculoskeletal: She exhibits no edema.  Left knee with tenderness at the joint itself. No pain with leg extension. ACL and PCL intact. No swelling.   Neurological: She is alert and oriented to person, place, and time. Coordination normal.  Skin: Skin is warm and dry.   Filed  Vitals:   07/19/15 1510  BP: 136/86  Pulse: 81  Temp: 98.5 F (36.9 C)  TempSrc: Oral  Resp: 16  Height: 5' 2.5" (1.588 m)  Weight: 134 lb 12.8 oz (61.145 kg)  SpO2: 98%      Assessment & Plan:

## 2015-07-20 NOTE — Assessment & Plan Note (Signed)
Checking x-ray for signs of arthritis. Will rx voltaren for pain as she does not want to continue with oral medicine if possible. No signs of tendon tear and no injury to the area.

## 2015-07-21 ENCOUNTER — Other Ambulatory Visit: Payer: Self-pay | Admitting: Internal Medicine

## 2015-07-21 MED ORDER — METFORMIN HCL 1000 MG PO TABS: 1000.0000 mg | ORAL_TABLET | Freq: Two times a day (BID) | ORAL | Status: DC

## 2015-08-08 ENCOUNTER — Encounter: Payer: Self-pay | Admitting: Internal Medicine

## 2015-08-08 ENCOUNTER — Ambulatory Visit (INDEPENDENT_AMBULATORY_CARE_PROVIDER_SITE_OTHER): Payer: 59 | Admitting: Internal Medicine

## 2015-08-08 VITALS — BP 152/80 | HR 80 | Wt 134.0 lb

## 2015-08-08 DIAGNOSIS — M25562 Pain in left knee: Secondary | ICD-10-CM

## 2015-08-08 DIAGNOSIS — IMO0002 Reserved for concepts with insufficient information to code with codable children: Secondary | ICD-10-CM

## 2015-08-08 DIAGNOSIS — E1165 Type 2 diabetes mellitus with hyperglycemia: Secondary | ICD-10-CM

## 2015-08-08 MED ORDER — MELOXICAM 7.5 MG PO TABS: 7.5000 mg | ORAL_TABLET | Freq: Every day | ORAL | Status: DC

## 2015-08-08 MED ORDER — METHYLPREDNISOLONE ACETATE 40 MG/ML IJ SUSP
40.0000 mg | Freq: Once | INTRAMUSCULAR | Status: AC
  Administered 2015-08-08: 40 mg via INTRA_ARTICULAR

## 2015-08-08 MED ORDER — VITAMIN D3 50 MCG (2000 UT) PO CAPS: 2000.0000 [IU] | ORAL_CAPSULE | Freq: Every day | ORAL | Status: DC

## 2015-08-08 NOTE — Patient Instructions (Signed)
Postprocedure instructions :    A Band-Aid should be left on for 12 hours. Injection therapy is not a cure itself. It is used in conjunction with other modalities. You can use nonsteroidal anti-inflammatories like ibuprofen , hot and cold compresses. Rest is recommended in the next 24 hours. You need to report immediately  if fever, chills or any signs of infection develop. 

## 2015-08-08 NOTE — Progress Notes (Signed)
Pre visit review using our clinic review tool, if applicable. No additional management support is needed unless otherwise documented below in the visit note. 

## 2015-08-08 NOTE — Assessment & Plan Note (Signed)
Will inject Mobic pm Vit D Dr Tamala Julian

## 2015-08-08 NOTE — Progress Notes (Signed)
Subjective:  Patient ID: Michelle Robertson, female    DOB: [birth year]  Age: 64 y.o. MRN: [id number]  CC: Knee Pain   HPI Noorah Holm presents for L knee pain since August. No injury. Topical Voltaren did not help  Outpatient Prescriptions Prior to Visit  Medication Sig Dispense Refill  . diclofenac sodium (VOLTAREN) 1 % GEL Apply 2 g topically 3 (three) times daily as needed. 100 g 6  . glucose blood (BAYER CONTOUR TEST) test strip 1 each by Other route 3 (three) times daily. Use to check blood sugars three times of day Dx E11.9 100 each 11  . metFORMIN (GLUCOPHAGE) 1000 MG tablet Take 1 tablet (1,000 mg total) by mouth 2 (two) times daily with a meal. 180 tablet 3  . Tetrahydrozoline HCl (VISINE OP) Place 2 drops into both eyes as needed (for dry eyes).    . simvastatin (ZOCOR) 40 MG tablet Take 1 tablet (40 mg total) by mouth at bedtime. (Patient not taking: Reported on 08/08/2015) 90 tablet 3   No facility-administered medications prior to visit.    ROS Review of Systems  Constitutional: Negative for chills, activity change, appetite change, fatigue and unexpected weight change.  HENT: Negative for congestion, mouth sores and sinus pressure.   Eyes: Negative for visual disturbance.  Respiratory: Negative for cough and chest tightness.   Gastrointestinal: Negative for nausea and abdominal pain.  Genitourinary: Negative for frequency, difficulty urinating and vaginal pain.  Musculoskeletal: Positive for arthralgias and gait problem. Negative for back pain.  Skin: Negative for pallor and rash.  Neurological: Negative for dizziness, tremors, weakness, numbness and headaches.  Psychiatric/Behavioral: Negative for confusion and sleep disturbance.    Objective:  BP 152/80 mmHg  Pulse 80  Wt 134 lb (60.782 kg)  SpO2 98%  BP Readings from Last 3 Encounters:  08/08/15 152/80  07/19/15 136/86  07/06/15 169/91    Wt Readings from Last 3 Encounters:  08/08/15 134 lb (60.782 kg)    07/19/15 134 lb 12.8 oz (61.145 kg)  03/15/15 139 lb (63.05 kg)    Physical Exam  Constitutional: She appears well-developed. No distress.  HENT:  Head: Normocephalic.  Right Ear: External ear normal.  Left Ear: External ear normal.  Nose: Nose normal.  Mouth/Throat: Oropharynx is clear and moist.  Eyes: Conjunctivae are normal. Pupils are equal, round, and reactive to light. Right eye exhibits no discharge. Left eye exhibits no discharge.  Neck: Normal range of motion. Neck supple. No JVD present. No tracheal deviation present. No thyromegaly present.  Cardiovascular: Normal rate, regular rhythm and normal heart sounds.   Pulmonary/Chest: No stridor. No respiratory distress. She has no wheezes.  Abdominal: Soft. Bowel sounds are normal. She exhibits no distension and no mass. There is no tenderness. There is no rebound and no guarding.  Musculoskeletal: She exhibits tenderness. She exhibits no edema.  Lymphadenopathy:    She has no cervical adenopathy.  Neurological: She displays normal reflexes. No cranial nerve deficit. She exhibits normal muscle tone. Coordination abnormal.  Skin: No rash noted. No erythema.  Psychiatric: She has a normal mood and affect. Her behavior is normal. Judgment and thought content normal.  L lat knee is tender Flat feet  Lab Results  Component Value Date   WBC 5.6 03/15/2015   HGB 12.0 03/15/2015   HCT 35.2* 03/15/2015   PLT 301.0 03/15/2015   GLUCOSE 115* 07/19/2015   CHOL 231* 07/19/2015   TRIG * 07/19/2015    501.0 Triglyceride is over 400;  calculations on Lipids are invalid.   HDL 34.90* 07/19/2015   LDLDIRECT 134.0 07/19/2015   LDLCALC 163* 09/14/2010   ALT 9 07/19/2015   AST 13 07/19/2015   NA 140 07/19/2015   K 3.8 07/19/2015   CL 105 07/19/2015   CREATININE 0.91 07/19/2015   BUN 18 07/19/2015   CO2 26 07/19/2015   TSH 3.49 07/19/2015   HGBA1C 8.0* 07/19/2015   MICROALBUR 1.78 03/14/2010    Dg Knee Complete 4 Views  Left  07/19/2015   CLINICAL DATA:  Left knee pain for 1 month.  No known injury.  EXAM: LEFT KNEE - COMPLETE 4+ VIEW  COMPARISON:  None.  FINDINGS: There is no evidence of fracture, dislocation, or joint effusion. There is no evidence of arthropathy or other focal bone abnormality. Soft tissues are unremarkable.  IMPRESSION: Negative.   Electronically Signed   By: Earle Gell M.D.   On: 07/19/2015 16:36    Procedure Note :     Procedure : Joint Injection, L  knee   Indication:  Joint osteoarthritis with refractory  chronic pain.   Risks including unsuccessful procedure , bleeding, infection, bruising, skin atrophy, "steroid flare-up" and others were explained to the patient in detail as well as the benefits. Informed consent was obtained and signed.   Tthe patient was placed in a comfortable position. Lateral approach was used. Skin was prepped with Betadine and alcohol  and anesthetized a cooling spray. Then, a 5 cc syringe with a 1.5 inch long 25-gauge needle was used for a joint injection.. The needle was advanced  Into the knee joint cavity. I aspirated a small amount of intra-articular fluid to confirm correct placement of the needle and injected the joint with 5 mL of 2% lidocaine and 40 mg of Depo-Medrol .  Band-Aid was applied.   Tolerated well. Complications: None. Good pain relief following the procedure.    Assessment & Plan:   There are no diagnoses linked to this encounter. I am having Ms. Nanninga maintain her Tetrahydrozoline HCl (VISINE OP), simvastatin, glucose blood, diclofenac sodium, and metFORMIN.  No orders of the defined types were placed in this encounter.     Follow-up: No Follow-up on file.  Walker Kehr, MD

## 2015-08-08 NOTE — Assessment & Plan Note (Signed)
On treatment.  

## 2015-08-12 ENCOUNTER — Encounter: Payer: Self-pay | Admitting: Internal Medicine

## 2015-08-21 ENCOUNTER — Encounter (HOSPITAL_COMMUNITY): Payer: Self-pay | Admitting: *Deleted

## 2015-08-21 ENCOUNTER — Emergency Department (HOSPITAL_COMMUNITY): Payer: 59

## 2015-08-21 ENCOUNTER — Emergency Department (HOSPITAL_COMMUNITY)
Admission: EM | Admit: 2015-08-21 | Discharge: 2015-08-21 | Disposition: A | Discharge status: Home / Self Care | Payer: 59 | Attending: Emergency Medicine | Admitting: Emergency Medicine

## 2015-08-21 DIAGNOSIS — R002 Palpitations: Secondary | ICD-10-CM | POA: Diagnosis not present

## 2015-08-21 DIAGNOSIS — E119 Type 2 diabetes mellitus without complications: Secondary | ICD-10-CM | POA: Diagnosis not present

## 2015-08-21 DIAGNOSIS — F419 Anxiety disorder, unspecified: Secondary | ICD-10-CM | POA: Insufficient documentation

## 2015-08-21 DIAGNOSIS — R110 Nausea: Secondary | ICD-10-CM | POA: Insufficient documentation

## 2015-08-21 DIAGNOSIS — Z79899 Other long term (current) drug therapy: Secondary | ICD-10-CM | POA: Diagnosis not present

## 2015-08-21 LAB — BASIC METABOLIC PANEL
ANION GAP: 13 (ref 5–15)
BUN: 14 mg/dL (ref 6–20)
CALCIUM: 9.2 mg/dL (ref 8.9–10.3)
CO2: 19 mmol/L — ABNORMAL LOW (ref 22–32)
Chloride: 106 mmol/L (ref 101–111)
Creatinine, Ser: 0.84 mg/dL (ref 0.44–1.00)
GLUCOSE: 136 mg/dL — AB (ref 65–99)
POTASSIUM: 4.6 mmol/L (ref 3.5–5.1)
Sodium: 138 mmol/L (ref 135–145)

## 2015-08-21 LAB — CBC
HEMATOCRIT: 33.7 % — AB (ref 36.0–46.0)
HEMOGLOBIN: 11.2 g/dL — AB (ref 12.0–15.0)
MCH: 28.6 pg (ref 26.0–34.0)
MCHC: 33.2 g/dL (ref 30.0–36.0)
MCV: 86 fL (ref 78.0–100.0)
Platelets: 370 10*3/uL (ref 150–400)
RBC: 3.92 MIL/uL (ref 3.87–5.11)
RDW: 13 % (ref 11.5–15.5)
WBC: 8.9 10*3/uL (ref 4.0–10.5)

## 2015-08-21 LAB — I-STAT TROPONIN, ED: Troponin i, poc: 0 ng/mL (ref 0.00–0.08)

## 2015-08-21 LAB — PROTIME-INR
INR: 0.98 (ref 0.00–1.49)
PROTHROMBIN TIME: 13.2 s (ref 11.6–15.2)

## 2015-08-21 LAB — CBG MONITORING, ED: GLUCOSE-CAPILLARY: 120 mg/dL — AB (ref 65–99)

## 2015-08-21 MED ORDER — SODIUM CHLORIDE 0.9 % IV SOLN
1000.0000 mL | INTRAVENOUS | Status: DC
  Administered 2015-08-21: 1000 mL via INTRAVENOUS

## 2015-08-21 MED ORDER — LORAZEPAM 2 MG/ML IJ SOLN
0.5000 mg | Freq: Once | INTRAMUSCULAR | Status: AC
  Administered 2015-08-21: 0.5 mg via INTRAVENOUS
  Filled 2015-08-21: qty 1

## 2015-08-21 MED ORDER — ONDANSETRON HCL 4 MG/2ML IJ SOLN
4.0000 mg | Freq: Once | INTRAMUSCULAR | Status: DC
  Filled 2015-08-21: qty 2

## 2015-08-21 MED ORDER — NITROGLYCERIN 0.4 MG SL SUBL: 0.4000 mg | SUBLINGUAL_TABLET | SUBLINGUAL | Status: DC | PRN

## 2015-08-21 MED ORDER — SODIUM CHLORIDE 0.9 % IV SOLN: 1000.0000 mL | INTRAVENOUS | Status: DC

## 2015-08-21 MED ORDER — ONDANSETRON 8 MG PO TBDP: 8.0000 mg | ORAL_TABLET | Freq: Three times a day (TID) | ORAL | Status: DC | PRN

## 2015-08-21 MED ORDER — NITROGLYCERIN 2 % TD OINT: 1.0000 [in_us] | TOPICAL_OINTMENT | Freq: Once | TRANSDERMAL | Status: DC

## 2015-08-21 MED ORDER — ASPIRIN 81 MG PO CHEW
324.0000 mg | CHEWABLE_TABLET | Freq: Once | ORAL | Status: AC
  Administered 2015-08-21: 324 mg via ORAL
  Filled 2015-08-21: qty 4

## 2015-08-21 MED ORDER — ONDANSETRON 8 MG PO TBDP
8.0000 mg | ORAL_TABLET | Freq: Once | ORAL | Status: AC
  Administered 2015-08-21: 8 mg via ORAL
  Filled 2015-08-21: qty 1

## 2015-08-21 NOTE — ED Notes (Signed)
Manuela Schwartz, RN bedside for ultrasound IV placement

## 2015-08-21 NOTE — ED Notes (Signed)
All entries at [phone] were entered in error

## 2015-08-21 NOTE — Discharge Instructions (Signed)
Panic Attacks Panic attacks are sudden, short-livedsurges of severe anxiety, fear, or discomfort. They may occur for no reason when you are relaxed, when you are anxious, or when you are sleeping. Panic attacks may occur for a number of reasons:   Healthy people occasionally have panic attacks in extreme, life-threatening situations, such as war or natural disasters. Normal anxiety is a protective mechanism of the body that helps [place] react to danger (fight or flight response).  Panic attacks are often seen with anxiety disorders, such as panic disorder, social anxiety disorder, generalized anxiety disorder, and phobias. Anxiety disorders cause excessive or uncontrollable anxiety. They may interfere with your relationships or other life activities.  Panic attacks are sometimes seen with other mental illnesses, such as depression and posttraumatic stress disorder.  Certain medical conditions, prescription medicines, and drugs of abuse can cause panic attacks. SYMPTOMS  Panic attacks start suddenly, peak within 20 minutes, and are accompanied by four or more of the following symptoms:  Pounding heart or fast heart rate (palpitations).  Sweating.  Trembling or shaking.  Shortness of breath or feeling smothered.  Feeling choked.  Chest pain or discomfort.  Nausea or strange feeling in your stomach.  Dizziness, light-headedness, or feeling like you will faint.  Chills or hot flushes.  Numbness or tingling in your lips or hands and feet.  Feeling that things are not real or feeling that you are not yourself.  Fear of losing control or going crazy.  Fear of dying. Some of these symptoms can mimic serious medical conditions. For example, you may think you are having a heart attack. Although panic attacks can be very scary, they are not life threatening. DIAGNOSIS  Panic attacks are diagnosed through an assessment by your health care provider. Your health care provider will ask  questions about your symptoms, such as where and when they occurred. Your health care provider will also ask about your medical history and use of alcohol and drugs, including prescription medicines. Your health care provider may order blood tests or other studies to rule out a serious medical condition. Your health care provider may refer you to a mental health professional for further evaluation. TREATMENT   Most healthy people who have one or two panic attacks in an extreme, life-threatening situation will not require treatment.  The treatment for panic attacks associated with anxiety disorders or other mental illness typically involves counseling with a mental health professional, medicine, or a combination of both. Your health care provider will help determine what treatment is best for you.  Panic attacks due to physical illness usually go away with treatment of the illness. If prescription medicine is causing panic attacks, talk with your health care provider about stopping the medicine, decreasing the dose, or substituting another medicine.  Panic attacks due to alcohol or drug abuse go away with abstinence. Some adults need professional help in order to stop drinking or using drugs. HOME CARE INSTRUCTIONS   Take all medicines as directed by your health care provider.   Schedule and attend follow-up visits as directed by your health care provider. It is important to keep all your appointments. SEEK MEDICAL CARE IF:  You are not able to take your medicines as prescribed.  Your symptoms do not improve or get worse. SEEK IMMEDIATE MEDICAL CARE IF:   You experience panic attack symptoms that are different than your usual symptoms.  You have serious thoughts about hurting yourself or others.  You are taking medicine for panic attacks and  have a serious side effect. MAKE SURE YOU:  Understand these instructions.  Will watch your condition.  Will get help right away if you are not  doing well or get worse. Document Released: 11/05/2005 Document Revised: 11/10/2013 Document Reviewed: 06/19/2013 [facility] Patient Information 2015 [place], [place]. This information is not intended to replace advice given to you by your health care provider. Make sure you discuss any questions you have with your health care provider. Palpitations A palpitation is the feeling that your heartbeat is irregular or is faster than normal. It may feel like your heart is fluttering or skipping a beat. Palpitations are usually not a serious problem. However, in some cases, you may need further medical evaluation. CAUSES  Palpitations can be caused by:  Smoking.  Caffeine or other stimulants, such as diet pills or energy drinks.  Alcohol.  Stress and anxiety.  Strenuous physical activity.  Fatigue.  Certain medicines.  Heart disease, especially if you have a history of irregular heart rhythms (arrhythmias), such as atrial fibrillation, atrial flutter, or supraventricular tachycardia.  An improperly working pacemaker or defibrillator. DIAGNOSIS  To find the cause of your palpitations, your health care provider will take your medical history and perform a physical exam. Your health care provider may also have you take a test called an ambulatory electrocardiogram (ECG). An ECG records your heartbeat patterns over a 24-hour period. You may also have other tests, such as:  Transthoracic echocardiogram (TTE). During echocardiography, sound waves are used to evaluate how blood flows through your heart.  Transesophageal echocardiogram (TEE).  Cardiac monitoring. This allows your health care provider to monitor your heart rate and rhythm in real time.  Holter monitor. This is a portable device that records your heartbeat and can help diagnose heart arrhythmias. It allows your health care provider to track your heart activity for several days, if needed.  Stress tests by exercise or by giving  medicine that makes the heart beat faster. TREATMENT  Treatment of palpitations depends on the cause of your symptoms and can vary greatly. Most cases of palpitations do not require any treatment other than time, relaxation, and monitoring your symptoms. Other causes, such as atrial fibrillation, atrial flutter, or supraventricular tachycardia, usually require further treatment. HOME CARE INSTRUCTIONS   Avoid:  Caffeinated coffee, tea, soft drinks, diet pills, and energy drinks.  Chocolate.  Alcohol.  Stop smoking if you smoke.  Reduce your stress and anxiety. Things that can help you relax include:  A method of controlling things in your body, such as your heartbeats, with your mind (biofeedback).  Yoga.  Meditation.  Physical activity such as swimming, jogging, or walking.  Get plenty of rest and sleep. SEEK MEDICAL CARE IF:   You continue to have a fast or irregular heartbeat beyond 24 hours.  Your palpitations occur more often. SEEK IMMEDIATE MEDICAL CARE IF:  You have chest pain or shortness of breath.  You have a severe headache.  You feel dizzy or you faint. MAKE SURE YOU:  Understand these instructions.  Will watch your condition.  Will get help right away if you are not doing well or get worse. Document Released: 11/02/2000 Document Revised: 11/10/2013 Document Reviewed: 01/04/2012 [facility] Patient Information 2015 [place], [place]. This information is not intended to replace advice given to you by your health care provider. Make sure you discuss any questions you have with your health care provider.

## 2015-08-21 NOTE — ED Notes (Signed)
rn went to do EKG, pt in restroom

## 2015-08-21 NOTE — ED Notes (Signed)
Manuela Schwartz, RN notified for Ultrasound IV placement.

## 2015-08-21 NOTE — ED Provider Notes (Signed)
CSN: [id number]     Arrival date & time 08/21/15  1128 History   First MD Initiated Contact with Patient 08/21/15 1156     Chief Complaint  Patient presents with  . Chest Palpitations     HPI Patient presents to the emergency room with complaints of palpitations, and nausea. Patient states she went to work today. She had a chai tea for breakfast. She's never had that before.  short time after that the patient started feeling anxious, shaky and nauseated. She also felt that her heart was beating fast. Those symptoms have persisted although the palpitations may be less than before. Patient denies any history of heart disease. She has had similar episodes in the past and was evaluated.  She states they never found an abnormal heart rhythm.   Past Medical History  Diagnosis Date  . Diabetes mellitus without complication ([place])   . Ectopic pregnancy    Past Surgical History  Procedure Laterality Date  . Tubal ligation     Family History  Problem Relation Age of Onset  . Diabetes Mother   . Diabetes Maternal Aunt   . Diabetes Paternal Grandmother    Social History  Substance Use Topics  . Smoking status: Never Smoker   . Smokeless tobacco: Never Used  . Alcohol Use: No   OB History    No data available     Review of Systems  Constitutional: Negative for fever.  Cardiovascular: Negative for chest pain.  Gastrointestinal: Positive for nausea. Negative for vomiting, abdominal pain and diarrhea.  Neurological: Negative for speech difficulty and light-headedness.  Psychiatric/Behavioral: The patient is nervous/anxious.       Allergies  Review of patient's allergies indicates no known allergies.  Home Medications   Prior to Admission medications   Medication Sig Start Date End Date Taking? Authorizing Provider  diclofenac sodium (VOLTAREN) 1 % GEL Apply 2 g topically 3 (three) times daily as needed. Patient taking differently: Apply 2 g topically 3 (three) times daily as needed  (pain).  07/19/15  Yes Hoyt Koch, MD  glucose blood (BAYER CONTOUR TEST) test strip 1 each by Other route 3 (three) times daily. Use to check blood sugars three times of day Dx E11.9 07/07/15  Yes Hoyt Koch, MD  metFORMIN (GLUCOPHAGE) 1000 MG tablet Take 1 tablet (1,000 mg total) by mouth 2 (two) times daily with a meal. 07/21/15  Yes Hoyt Koch, MD  Tetrahydrozoline HCl (VISINE OP) Place 2 drops into both eyes as needed (for dry eyes).   Yes Historical Provider, MD  traMADol (ULTRAM) 50 MG tablet Take 1 tablet by mouth daily as needed. pain 07/07/15  Yes Historical Provider, MD  Cholecalciferol (VITAMIN D3) 2000 UNITS capsule Take 1 capsule (2,000 Units total) by mouth daily. Patient not taking: Reported on 08/21/2015 08/08/15   Tyrone Apple Plotnikov V, MD  meloxicam (MOBIC) 7.5 MG tablet Take 1 tablet (7.5 mg total) by mouth daily. Patient not taking: Reported on 08/21/2015 08/08/15   Lew Dawes V, MD  simvastatin (ZOCOR) 40 MG tablet Take 1 tablet (40 mg total) by mouth at bedtime. Patient not taking: Reported on 08/08/2015 03/17/15   Hoyt Koch, MD   BP 154/75 mmHg  Pulse 74  Temp(Src) 98 F (36.7 C) (Oral)  Resp 16  SpO2 100% Physical Exam  Constitutional: She appears well-developed and well-nourished. No distress.  HENT:  Head: Normocephalic and atraumatic.  Right Ear: External ear normal.  Left Ear: External ear normal.  Eyes:  Conjunctivae are normal. Right eye exhibits no discharge. Left eye exhibits no discharge. No scleral icterus.  Neck: Neck supple. No tracheal deviation present. No thyromegaly present.  Cardiovascular: Normal rate, regular rhythm and intact distal pulses.   Pulmonary/Chest: Effort normal and breath sounds normal. No stridor. No respiratory distress. She has no wheezes. She has no rales.  Abdominal: Soft. Bowel sounds are normal. She exhibits no distension. There is no tenderness. There is no rebound and no guarding.   Musculoskeletal: She exhibits no edema or tenderness.  Neurological: She is alert. She has normal strength. No cranial nerve deficit (no facial droop, extraocular movements intact, no slurred speech) or sensory deficit. She exhibits normal muscle tone. She displays no seizure activity. Coordination normal.  Skin: Skin is warm and dry. No rash noted. She is not diaphoretic.  Psychiatric: She has a normal mood and affect.  Nursing note and vitals reviewed.   ED Course  Procedures (including critical care time) Labs Review Labs Reviewed  BASIC METABOLIC PANEL - Abnormal; Notable for the following:    CO2 19 (*)    Glucose, Bld 136 (*)    All other components within normal limits  CBC - Abnormal; Notable for the following:    Hemoglobin 11.2 (*)    HCT 33.7 (*)    All other components within normal limits  CBG MONITORING, ED - Abnormal; Notable for the following:    Glucose-Capillary 120 (*)    All other components within normal limits  PROTIME-INR  I-STAT TROPOININ, ED  Randolm Idol, ED    Imaging Review Dg Chest Portable 1 View  08/21/2015   CLINICAL DATA:  Palpitations. Shortness of breath. Nausea. Diabetes.  EXAM: PORTABLE CHEST 1 VIEW  COMPARISON:  04/19/2015  FINDINGS: Moderate right hemidiaphragm elevation. Midline trachea. Normal heart size. Atherosclerosis in the transverse aorta. No pleural effusion or pneumothorax. Clear lungs.  IMPRESSION: No acute cardiopulmonary disease.  Aortic atherosclerosis.   Electronically Signed   By: Abigail Miyamoto M.D.   On: 08/21/2015 13:13   I have personally reviewed and evaluated these images and lab results as part of my medical decision-making.   EKG Interpretation   Date/Time:  Sunday August 21 2015 11:39:34 EDT Ventricular Rate:  88 PR Interval:  165 QRS Duration: 70 QT Interval:  351 QTC Calculation: 425 R Axis:   42 Text Interpretation:  Sinus rhythm Probable left atrial enlargement  Borderline repolarization abnormality  No significant change since last  tracing Confirmed by Oliviah Agostini  MD-J, Lenox Ladouceur ([id number]) on 08/21/2015 11:44:52 AM      MDM   Final diagnoses:  Palpitations  Anxiety    I reviewed the laboratory tests and x-rays with the patient. Chest x-ray does not show evidence pneumonia. EKG shows a normal rhythm. Her laboratory tests show [place] slight decrease in her bicarbonate level but I think this is insignificant.  Patient's symptoms may have been triggered by the caffeine. There is also possible she had a panic attack. She has had a few episodes like this now. Patient was given a dose of Ativan and something for nausea and her symptoms resolved.  Think she is stable to follow up with her primary care doctor. If she continues to have recurrent episodes they may consider a medication regimen for panic attacks.    Dorie Rank, MD 08/21/15 [phone]

## 2015-08-21 NOTE — ED Notes (Signed)
Pt given Rx for Zofran

## 2015-08-21 NOTE — ED Notes (Signed)
Pt reports she went to work, drank a chi tea she had never tried before. Right after pt drank the tea pt started having chest palpitations, trembling, feeling shaky and nausea. Palpitations are intermittent now, shakiness still present. Denies pain. Reports some SOB. Able to speak in full sentences. Denies cardiac hx.   Hx of palpitations has seen doctor for this, stayed overnight one time at [facility] - all results negative, reports this is the worst palpitations episode to date.

## 2015-08-22 ENCOUNTER — Ambulatory Visit: Payer: 59 | Admitting: Family Medicine

## 2015-09-08 ENCOUNTER — Emergency Department (HOSPITAL_COMMUNITY)
Admission: EM | Admit: 2015-09-08 | Discharge: 2015-09-08 | Disposition: A | Discharge status: Home / Self Care | Payer: 59 | Attending: Physician Assistant | Admitting: Physician Assistant

## 2015-09-08 ENCOUNTER — Encounter (HOSPITAL_COMMUNITY): Payer: Self-pay | Admitting: Emergency Medicine

## 2015-09-08 ENCOUNTER — Emergency Department (HOSPITAL_COMMUNITY)
Admission: EM | Admit: 2015-09-08 | Discharge: 2015-09-08 | Disposition: A | Discharge status: Home / Self Care | Payer: 59 | Source: Home / Self Care | Attending: Emergency Medicine | Admitting: Emergency Medicine

## 2015-09-08 ENCOUNTER — Encounter (HOSPITAL_COMMUNITY): Payer: Self-pay

## 2015-09-08 DIAGNOSIS — F419 Anxiety disorder, unspecified: Secondary | ICD-10-CM | POA: Diagnosis not present

## 2015-09-08 DIAGNOSIS — R002 Palpitations: Secondary | ICD-10-CM | POA: Diagnosis not present

## 2015-09-08 DIAGNOSIS — E119 Type 2 diabetes mellitus without complications: Secondary | ICD-10-CM | POA: Insufficient documentation

## 2015-09-08 LAB — I-STAT CHEM 8, ED
BUN: 11 mg/dL (ref 6–20)
CHLORIDE: 107 mmol/L (ref 101–111)
CREATININE: 0.8 mg/dL (ref 0.44–1.00)
Calcium, Ion: 1.07 mmol/L — ABNORMAL LOW (ref 1.13–1.30)
GLUCOSE: 166 mg/dL — AB (ref 65–99)
HEMATOCRIT: 34 % — AB (ref 36.0–46.0)
HEMOGLOBIN: 11.6 g/dL — AB (ref 12.0–15.0)
POTASSIUM: 4.1 mmol/L (ref 3.5–5.1)
Sodium: 140 mmol/L (ref 135–145)
TCO2: 23 mmol/L (ref 0–100)

## 2015-09-08 LAB — CBG MONITORING, ED: GLUCOSE-CAPILLARY: 159 mg/dL — AB (ref 65–99)

## 2015-09-08 LAB — I-STAT TROPONIN, ED: Troponin i, poc: 0.01 ng/mL (ref 0.00–0.08)

## 2015-09-08 MED ORDER — METOCLOPRAMIDE HCL 10 MG PO TABS: 10.0000 mg | ORAL_TABLET | Freq: Three times a day (TID) | ORAL | Status: DC | PRN

## 2015-09-08 NOTE — ED Notes (Signed)
MD at bedside. 

## 2015-09-08 NOTE — ED Notes (Signed)
Patient states she's "feeling better" and would like to leave AMA

## 2015-09-08 NOTE — ED Notes (Signed)
Pt just discharged for palpitations. sts she was feeling better when discharged but while she was waiting for her ride started to feel bad again. Pt c/o palpitations and nausea.

## 2015-09-08 NOTE — Discharge Instructions (Signed)
1. Medications: usual home medications 2. Treatment: rest, drink plenty of fluids,  3. Follow Up: Please followup with your primary doctor in 1-2 days for discussion of your diagnoses and further evaluation after today's visit; if you do not have a primary care doctor use the resource guide provided to find one or use the resource guide to find behavioral health resources; Please return to the ER for worsening or return of symptoms    Palpitations A palpitation is the feeling that your heartbeat is irregular or is faster than normal. It may feel like your heart is fluttering or skipping a beat. Palpitations are usually not a serious problem. However, in some cases, you may need further medical evaluation. CAUSES  Palpitations can be caused by:  Smoking.  Caffeine or other stimulants, such as diet pills or energy drinks.  Alcohol.  Stress and anxiety.  Strenuous physical activity.  Fatigue.  Certain medicines.  Heart disease, especially if you have a history of irregular heart rhythms (arrhythmias), such as atrial fibrillation, atrial flutter, or supraventricular tachycardia.  An improperly working pacemaker or defibrillator. DIAGNOSIS  To find the cause of your palpitations, your health care provider will take your medical history and perform a physical exam. Your health care provider may also have you take a test called an ambulatory electrocardiogram (ECG). An ECG records your heartbeat patterns over a 24-hour period. You may also have other tests, such as:  Transthoracic echocardiogram (TTE). During echocardiography, sound waves are used to evaluate how blood flows through your heart.  Transesophageal echocardiogram (TEE).  Cardiac monitoring. This allows your health care provider to monitor your heart rate and rhythm in real time.  Holter monitor. This is a portable device that records your heartbeat and can help diagnose heart arrhythmias. It allows your health care provider  to track your heart activity for several days, if needed.  Stress tests by exercise or by giving medicine that makes the heart beat faster. TREATMENT  Treatment of palpitations depends on the cause of your symptoms and can vary greatly. Most cases of palpitations do not require any treatment other than time, relaxation, and monitoring your symptoms. Other causes, such as atrial fibrillation, atrial flutter, or supraventricular tachycardia, usually require further treatment. HOME CARE INSTRUCTIONS   Avoid:  Caffeinated coffee, tea, soft drinks, diet pills, and energy drinks.  Chocolate.  Alcohol.  Stop smoking if you smoke.  Reduce your stress and anxiety. Things that can help you relax include:  A method of controlling things in your body, such as your heartbeats, with your mind (biofeedback).  Yoga.  Meditation.  Physical activity such as swimming, jogging, or walking.  Get plenty of rest and sleep. SEEK MEDICAL CARE IF:   You continue to have a fast or irregular heartbeat beyond 24 hours.  Your palpitations occur more often. SEEK IMMEDIATE MEDICAL CARE IF:  You have chest pain or shortness of breath.  You have a severe headache.  You feel dizzy or you faint. MAKE SURE YOU:  Understand these instructions.  Will watch your condition.  Will get help right away if you are not doing well or get worse.   This information is not intended to replace advice given to you by your health care provider. Make sure you discuss any questions you have with your health care provider.   Document Released: 11/02/2000 Document Revised: 11/10/2013 Document Reviewed: 01/04/2012 Elsevier Interactive Patient Education 2016 Elsevier Inc.   Panic Attacks Panic attacks are sudden, short-livedsurges of severe anxiety,  fear, or discomfort. They may occur for no reason when you are relaxed, when you are anxious, or when you are sleeping. Panic attacks may occur for a number of reasons:     Healthy people occasionally have panic attacks in extreme, life-threatening situations, such as war or natural disasters. Normal anxiety is a protective mechanism of the body that helps [place] react to danger (fight or flight response).  Panic attacks are often seen with anxiety disorders, such as panic disorder, social anxiety disorder, generalized anxiety disorder, and phobias. Anxiety disorders cause excessive or uncontrollable anxiety. They may interfere with your relationships or other life activities.  Panic attacks are sometimes seen with other mental illnesses, such as depression and posttraumatic stress disorder.  Certain medical conditions, prescription medicines, and drugs of abuse can cause panic attacks. SYMPTOMS  Panic attacks start suddenly, peak within 20 minutes, and are accompanied by four or more of the following symptoms:  Pounding heart or fast heart rate (palpitations).  Sweating.  Trembling or shaking.  Shortness of breath or feeling smothered.  Feeling choked.  Chest pain or discomfort.  Nausea or strange feeling in your stomach.  Dizziness, light-headedness, or feeling like you will faint.  Chills or hot flushes.  Numbness or tingling in your lips or hands and feet.  Feeling that things are not real or feeling that you are not yourself.  Fear of losing control or going crazy.  Fear of dying. Some of these symptoms can mimic serious medical conditions. For example, you may think you are having a heart attack. Although panic attacks can be very scary, they are not life threatening. DIAGNOSIS  Panic attacks are diagnosed through an assessment by your health care provider. Your health care provider will ask questions about your symptoms, such as where and when they occurred. Your health care provider will also ask about your medical history and use of alcohol and drugs, including prescription medicines. Your health care provider may order blood tests or other  studies to rule out a serious medical condition. Your health care provider may refer you to a mental health professional for further evaluation. TREATMENT   Most healthy people who have one or two panic attacks in an extreme, life-threatening situation will not require treatment.  The treatment for panic attacks associated with anxiety disorders or other mental illness typically involves counseling with a mental health professional, medicine, or a combination of both. Your health care provider will help determine what treatment is best for you.  Panic attacks due to physical illness usually go away with treatment of the illness. If prescription medicine is causing panic attacks, talk with your health care provider about stopping the medicine, decreasing the dose, or substituting another medicine.  Panic attacks due to alcohol or drug abuse go away with abstinence. Some adults need professional help in order to stop drinking or using drugs. HOME CARE INSTRUCTIONS   Take all medicines as directed by your health care provider.   Schedule and attend follow-up visits as directed by your health care provider. It is important to keep all your appointments. SEEK MEDICAL CARE IF:  You are not able to take your medicines as prescribed.  Your symptoms do not improve or get worse. SEEK IMMEDIATE MEDICAL CARE IF:   You experience panic attack symptoms that are different than your usual symptoms.  You have serious thoughts about hurting yourself or others.  You are taking medicine for panic attacks and have a serious side effect. MAKE SURE YOU:  Understand these instructions.  Will watch your condition.  Will get help right away if you are not doing well or get worse.   This information is not intended to replace advice given to you by your health care provider. Make sure you discuss any questions you have with your health care provider.   Document Released: 11/05/2005 Document Revised:  11/10/2013 Document Reviewed: 06/19/2013 Elsevier Interactive Patient Education 2016 [employer]  Intensive Outpatient Programs: [facility]      [place]. [place], [place] Both a day and evening program       [facility] Outpatient     [address]        [place], [place] [address] [phone]         ADS: [facility] [place]: [phone] or [phone] [address] [place], [place] [address] [email or web address]  Mobile Crisis Teams:                                        [facility]         Mobile Crisis Care Unit [phone]             [facility] [place] [address] Lady Gary Aquilla [address] [place] [phone]  Self-Help/Support Groups: [facility] [employer] [employer] of support groups [phone] (call for more info)  Narcotics Anonymous (NA) [facility] [address] [place] - 2 meetings at this location  Residential Treatment Programs:  [place]       [place] [address] [place], [place]  [address] [place]  [address] [place], [place] [address] [phone] Admissions: 8am-3pm M-F  [facility] [place]     [address] [place], [place] [address]       [phone]         [facility] [address] Jadene Pierini [place], [place]  [facility] [address] [place], [place]  [facility] - Intensive Outpatient      [address] [place] [address]     [place], [place]         [facility]     [place], [place] or [phone]  Residential Treatment Services (RTS)  [place], [place]  [facility] [address]                                               [place]  [facility] Resources: [facility]  Human Services- [phone]               General Therapy                                                Domenic Schwab, PhD        [address] [place], [place] [address]         [facility]   [address] [place], [place] [address] [phone]  [facility] [address] [place], [place] [address] [phone] Insurance/Medicaid/sponsorship through [facility] and Families                                              [address] [place]                                        [place], [place] [address]    Therapy/tele-psych/case         [place] [address],   [address]  Adolescent/group home/case management [phone]                                           Rosette Reveal PhD       General therapy       Insurance   [phone]         Dr. Adele Schilder Insurance [phone] M-F  [place] [facility] Medicaid, sponsorship [phone]

## 2015-09-08 NOTE — ED Notes (Signed)
Per EMS- Pt was at work, got stressed out, went to drive home and pulled into a parking lot and was hyperventilating. Pt placed on nasal cannula without O2, pt stated she felt better. Pt also reports palpitations earlier today, none observed on monitor. Pt had to urinate upon arrival and was taken to the bathroom but went on the stretcher instead. BP 180/90.

## 2015-09-08 NOTE — ED Notes (Signed)
Pt sts she was seen before for same at [facility] and told she had a panic attack.

## 2015-09-08 NOTE — ED Notes (Signed)
Patient now would like to stay to be seen

## 2015-09-08 NOTE — ED Provider Notes (Signed)
CSN: [id number]     Arrival date & time 09/08/15  1152 History   First MD Initiated Contact with Patient 09/08/15 1214     Chief Complaint  Patient presents with  . Panic Attack     (Consider location/radiation/quality/duration/timing/severity/associated sxs/prior Treatment) The history is provided by the patient and medical records. No language interpreter was used.     Michelle Robertson is a 64 y.o. female  with a hx of NIDDM presents to the Emergency Department complaining of gradual and rapidly worsening nausea and palpitations onset around 10:15am after arriving at work.  Pt reports symptoms were the same a previous episode 3 weeks ago.  She reports the symptoms laster approx 1 hours and were associated with hyperventilation.  .She reports nothing makes it better or worse.  Pt reports intermittent panic attacks over the years.  Pt denies fever, chills, headache, neck pain, chest pain, abd pain, V/D, weakness, dizziness, syncope or near syncope.  Pt's sister at bedside reports that the patient is working multiple jobs and there is a large amount of stress in her life.  Pt reports her husband is in a nursing home and this is very stressful.   Pt reports her symptoms have resolved totally at this time.     Past Medical History  Diagnosis Date  . Diabetes mellitus without complication ([place])   . Ectopic pregnancy    Past Surgical History  Procedure Laterality Date  . Tubal ligation     Family History  Problem Relation Age of Onset  . Diabetes Mother   . Diabetes Maternal Aunt   . Diabetes Paternal Grandmother    Social History  Substance Use Topics  . Smoking status: Never Smoker   . Smokeless tobacco: Never Used  . Alcohol Use: No   OB History    No data available     Review of Systems  Constitutional: Negative for fever, diaphoresis, appetite change, fatigue and unexpected weight change.  HENT: Negative for mouth sores.   Eyes: Negative for visual disturbance.   Respiratory: Negative for cough, chest tightness, shortness of breath and wheezing.   Cardiovascular: Negative for chest pain.  Gastrointestinal: Negative for nausea, vomiting, abdominal pain, diarrhea and constipation.  Endocrine: Negative for polydipsia, polyphagia and polyuria.  Genitourinary: Negative for dysuria, urgency, frequency and hematuria.  Musculoskeletal: Negative for back pain and neck stiffness.  Skin: Negative for rash.  Allergic/Immunologic: Negative for immunocompromised state.  Neurological: Negative for syncope, light-headedness and headaches.  Hematological: Does not bruise/bleed easily.  Psychiatric/Behavioral: Negative for sleep disturbance. The patient is nervous/anxious.       Allergies  Review of patient's allergies indicates no known allergies.  Home Medications   Prior to Admission medications   Medication Sig Start Date End Date Taking? Authorizing Provider  diclofenac sodium (VOLTAREN) 1 % GEL Apply 2 g topically 3 (three) times daily as needed. Patient taking differently: Apply 2 g topically 3 (three) times daily as needed (pain).  07/19/15  Yes Hoyt Koch, MD  MECLIZINE HCL PO Take 2 tablets by mouth once.   Yes Historical Provider, MD  metFORMIN (GLUCOPHAGE) 1000 MG tablet Take 1 tablet (1,000 mg total) by mouth 2 (two) times daily with a meal. Patient taking differently: Take 500 mg by mouth 2 (two) times daily with a meal.  07/21/15  Yes Hoyt Koch, MD  ondansetron (ZOFRAN ODT) 8 MG disintegrating tablet Take 1 tablet (8 mg total) by mouth every 8 (eight) hours as needed for nausea or  vomiting. 08/21/15  Yes Dorie Rank, MD  Tetrahydrozoline HCl (VISINE OP) Place 2 drops into both eyes as needed (for dry eyes).   Yes Historical Provider, MD  glucose blood (BAYER CONTOUR TEST) test strip 1 each by Other route 3 (three) times daily. Use to check blood sugars three times of day Dx E11.9 07/07/15   Hoyt Koch, MD   BP 136/80  mmHg  Pulse 68  Temp(Src) 98.1 F (36.7 C) (Oral)  Resp 14  Ht 5\' 6"  (1.676 m)  Wt 137 lb (62.143 kg)  BMI 22.12 kg/m2  SpO2 100% Physical Exam  Constitutional: She appears well-developed and well-nourished. No distress.  Awake, alert, nontoxic appearance  HENT:  Head: Normocephalic and atraumatic.  Mouth/Throat: Oropharynx is clear and moist. No oropharyngeal exudate.  Eyes: Conjunctivae are normal. No scleral icterus.  Neck: Normal range of motion. Neck supple.  Cardiovascular: Normal rate, regular rhythm, S1 normal, S2 normal, normal heart sounds and intact distal pulses.   No murmur heard. Pulses:      Radial pulses are 2+ on the right side, and 2+ on the left side.       Dorsalis pedis pulses are 2+ on the right side, and 2+ on the left side.  Capillary refill less than 3 seconds  Pulmonary/Chest: Effort normal and breath sounds normal. No respiratory distress. She has no wheezes.  Equal chest expansion  Abdominal: Soft. Bowel sounds are normal. She exhibits no mass. There is no tenderness. There is no rebound and no guarding.  Musculoskeletal: Normal range of motion. She exhibits no edema.  Neurological: She is alert.  Mental Status:  Alert, oriented, thought content appropriate, able to give a coherent history. Speech fluent without evidence of aphasia. Able to follow 2 step commands without difficulty.  Cranial Nerves:  II:  Peripheral visual fields grossly normal, pupils equal, round, reactive to light III,IV, VI: ptosis not present, extra-ocular motions intact bilaterally  V,VII: smile symmetric, facial light touch sensation equal VIII: hearing grossly normal to voice  X: uvula elevates symmetrically  XI: bilateral shoulder shrug symmetric and strong XII: midline tongue extension without fassiculations Motor:  Normal tone. 5/5 in upper and lower extremities bilaterally including strong and equal grip strength and dorsiflexion/plantar flexion Sensory: Pinprick and  light touch normal in all extremities.  Deep Tendon Reflexes: 2+ and symmetric in the biceps and patella Cerebellar: normal finger-to-nose with bilateral upper extremities Gait: normal gait and balance CV: distal pulses palpable throughout     Skin: Skin is warm and dry. She is not diaphoretic.  Psychiatric: Her mood appears anxious.  Patient very anxious  Nursing note and vitals reviewed.   ED Course  Procedures (including critical care time) Labs Review Labs Reviewed  I-STAT CHEM 8, ED - Abnormal; Notable for the following:    Glucose, Bld 166 (*)    Calcium, Ion 1.07 (*)    Hemoglobin 11.6 (*)    HCT 34.0 (*)    All other components within normal limits    Imaging Review No results found. I have personally reviewed and evaluated these images and lab results as part of my medical decision-making.   EKG Interpretation   Date/Time:  Thursday September 08 2015 12:08:24 EDT Ventricular Rate:  85 PR Interval:  171 QRS Duration: 72 QT Interval:  364 QTC Calculation: 433 R Axis:   37 Text Interpretation:  Sinus rhythm Ventricular premature complex  Borderline T abnormalities, diffuse leads no acute ischemia.  No  significant change since  last tracing Confirmed by Gerald Leitz  [phone]) on 09/08/2015 12:19:21 PM      MDM   Final diagnoses:  Palpitations   Michelle Robertson presents with nausea and palpitations now resolved. Patient with similar episode on 08/21/2015 per record. The time she had a negative chest x-ray for pneumonia and an unchanged EKG.  Will check Chem 8 to ensure no anemia.     3:10 PM Chem-8 with mild anemia at 11.6 but otherwise relatively normal electrolytes. No hyperkalemia. No elevation in serum creatinine. Patient reports she feels totally normal at this time.  Orthostatic VS for the past 24 hrs:  BP- Lying Pulse- Lying BP- Sitting Pulse- Sitting BP- Standing at 0 minutes Pulse- Standing at 0 minutes  09/08/15 1517 135/82 mmHg 65 136/80 mmHg  75 129/86 mmHg 89   Patient symptoms were likely secondary to anxiety.  No orthostasis.  Doubt ACS as patient was a without chest pain, shortness of breath, syncope, near syncope. Her EKG is unchanged from previous. She has no cardiac history, no CP, no SOB.    Patient is to follow-up with her primary care physician tomorrow for further evaluation and referral to psychiatric services. She is to return to the emergency department for return of symptoms.  BP 136/80 mmHg  Pulse 68  Temp(Src) 98.1 F (36.7 C) (Oral)  Resp 14  Ht 5\' 6"  (1.676 m)  Wt 137 lb (62.143 kg)  BMI 22.12 kg/m2  SpO2 100%       Abigail Butts, PA-C 09/08/15 [place], MD 09/13/15 1510

## 2015-09-08 NOTE — Discharge Instructions (Signed)
Palpitations  Your blood sugar today was mildly elevated at 159. Take the medication prescribed as needed for nausea. Call Dr. Sharlet [place] to schedule a follow-up appointment regarding your palpitations and nausea. A palpitation is the feeling that your heartbeat is irregular or is faster than normal. It may feel like your heart is fluttering or skipping a beat. Palpitations are usually not a serious problem. However, in some cases, you may need further medical evaluation. CAUSES  Palpitations can be caused by:  Smoking.  Caffeine or other stimulants, such as diet pills or energy drinks.  Alcohol.  Stress and anxiety.  Strenuous physical activity.  Fatigue.  Certain medicines.  Heart disease, especially if you have a history of irregular heart rhythms (arrhythmias), such as atrial fibrillation, atrial flutter, or supraventricular tachycardia.  An improperly working pacemaker or defibrillator. DIAGNOSIS  To find the cause of your palpitations, your health care provider will take your medical history and perform a physical exam. Your health care provider may also have you take a test called an ambulatory electrocardiogram (ECG). An ECG records your heartbeat patterns over a 24-hour period. You may also have other tests, such as:  Transthoracic echocardiogram (TTE). During echocardiography, sound waves are used to evaluate how blood flows through your heart.  Transesophageal echocardiogram (TEE).  Cardiac monitoring. This allows your health care provider to monitor your heart rate and rhythm in real time.  Holter monitor. This is a portable device that records your heartbeat and can help diagnose heart arrhythmias. It allows your health care provider to track your heart activity for several days, if needed.  Stress tests by exercise or by giving medicine that makes the heart beat faster. TREATMENT  Treatment of palpitations depends on the cause of your symptoms and can vary greatly.  Most cases of palpitations do not require any treatment other than time, relaxation, and monitoring your symptoms. Other causes, such as atrial fibrillation, atrial flutter, or supraventricular tachycardia, usually require further treatment. HOME CARE INSTRUCTIONS   Avoid:  Caffeinated coffee, tea, soft drinks, diet pills, and energy drinks.  Chocolate.  Alcohol.  Stop smoking if you smoke.  Reduce your stress and anxiety. Things that can help you relax include:  A method of controlling things in your body, such as your heartbeats, with your mind (biofeedback).  Yoga.  Meditation.  Physical activity such as swimming, jogging, or walking.  Get plenty of rest and sleep. SEEK MEDICAL CARE IF:   You continue to have a fast or irregular heartbeat beyond 24 hours.  Your palpitations occur more often. SEEK IMMEDIATE MEDICAL CARE IF:  You have chest pain or shortness of breath.  You have a severe headache.  You feel dizzy or you faint. MAKE SURE YOU:  Understand these instructions.  Will watch your condition.  Will get help right away if you are not doing well or get worse.   This information is not intended to replace advice given to you by your health care provider. Make sure you discuss any questions you have with your health care provider.   Document Released: 11/02/2000 Document Revised: 11/10/2013 Document Reviewed: 01/04/2012 Elsevier Interactive Patient Education [employer].

## 2015-09-08 NOTE — ED Provider Notes (Addendum)
CSN: [id number]     Arrival date & time 09/08/15  1559 History   First MD Initiated Contact with Patient 09/08/15 1723     Chief Complaint  Patient presents with  . Palpitations     (Consider location/radiation/quality/duration/timing/severity/associated sxs/prior Treatment) HPI Patient had nausea lasting 10 minutes after discharge from ED visit here earlier today. She present to complains of racing heartbeat. No other associated symptoms. She treated herself withdimenhyrimine,, over-the-counter nausea medicine prior to coming here, without relief no chest pain no shortness of breath. Patient was seen here earlier today for palpitations and nausea, treated and released. Nothing makes symptoms better or worse. Past Medical History  Diagnosis Date  . Diabetes mellitus without complication ([place])   . Ectopic pregnancy    Past Surgical History  Procedure Laterality Date  . Tubal ligation     Family History  Problem Relation Age of Onset  . Diabetes Mother   . Diabetes Maternal Aunt   . Diabetes Paternal Grandmother    Social History  Substance Use Topics  . Smoking status: Never Smoker   . Smokeless tobacco: Never Used  . Alcohol Use: No   OB History    No data available     Review of Systems  Cardiovascular: Positive for palpitations.  Gastrointestinal: Positive for nausea.  All other systems reviewed and are negative.     Allergies  Review of patient's allergies indicates no known allergies.  Home Medications   Prior to Admission medications   Medication Sig Start Date End Date Taking? Authorizing Provider  diclofenac sodium (VOLTAREN) 1 % GEL Apply 2 g topically 3 (three) times daily as needed. Patient taking differently: Apply 2 g topically 3 (three) times daily as needed (pain).  07/19/15   Hoyt Koch, MD  glucose blood (BAYER CONTOUR TEST) test strip 1 each by Other route 3 (three) times daily. Use to check blood sugars three times of day Dx E11.9  07/07/15   Hoyt Koch, MD  MECLIZINE HCL PO Take 2 tablets by mouth once.    Historical Provider, MD  metFORMIN (GLUCOPHAGE) 1000 MG tablet Take 1 tablet (1,000 mg total) by mouth 2 (two) times daily with a meal. Patient taking differently: Take 500 mg by mouth 2 (two) times daily with a meal.  07/21/15   Hoyt Koch, MD  ondansetron (ZOFRAN ODT) 8 MG disintegrating tablet Take 1 tablet (8 mg total) by mouth every 8 (eight) hours as needed for nausea or vomiting. 08/21/15   Dorie Rank, MD  Tetrahydrozoline HCl (VISINE OP) Place 2 drops into both eyes as needed (for dry eyes).    Historical Provider, MD   BP 133/64 mmHg  Pulse 66  Temp(Src) 98.9 F (37.2 C) (Oral)  Resp 10  SpO2 100% Physical Exam  Constitutional: She appears well-developed and well-nourished.  HENT:  Head: Normocephalic and atraumatic.  Eyes: Conjunctivae are normal. Pupils are equal, round, and reactive to light.  Neck: Neck supple. No tracheal deviation present. No thyromegaly present.  Cardiovascular: Normal rate and regular rhythm.   No murmur heard. Pulmonary/Chest: Effort normal and breath sounds normal.  Abdominal: Soft. Bowel sounds are normal. She exhibits no distension. There is no tenderness.  Musculoskeletal: Normal range of motion. She exhibits no edema or tenderness.  Neurological: She is alert. Coordination normal.  Skin: Skin is warm and dry. No rash noted.  Psychiatric: She has a normal mood and affect.  Nursing note and vitals reviewed.   ED Course  Procedures (  including critical care time) Labs Review Labs Reviewed  CBG MONITORING, ED - Abnormal; Notable for the following:    Glucose-Capillary 159 (*)    All other components within normal limits  I-STAT TROPOININ, ED    Imaging Review No results found. I have personally reviewed and evaluated these images and lab results as part of my medical decision-making.   EKG Interpretation   Date/Time:  Thursday September 08 2015  16:12:01 EDT Ventricular Rate:  83 PR Interval:  164 QRS Duration: 76 QT Interval:  336 QTC Calculation: 394 R Axis:   46 Text Interpretation:  Normal sinus rhythm Nonspecific T wave abnormality  Abnormal ECG No significant change since last tracing Confirmed by  Winfred Leeds  MD, Lash Matulich [phone]) on 09/08/2015 5:35:59 PM     Results for orders placed or performed during the hospital encounter of 09/08/15  I-Stat Troponin, ED (not at [facility])  Result Value Ref Range   Troponin i, poc 0.01 0.00 - 0.08 ng/mL   Comment 3          CBG monitoring, ED  Result Value Ref Range   Glucose-Capillary 159 (H) 65 - 99 mg/dL   Comment 1 Notify RN    Comment 2 Document in Chart    Dg Chest Portable 1 View  08/21/2015  CLINICAL DATA:  Palpitations. Shortness of breath. Nausea. Diabetes. EXAM: PORTABLE CHEST 1 VIEW COMPARISON:  04/19/2015 FINDINGS: Moderate right hemidiaphragm elevation. Midline trachea. Normal heart size. Atherosclerosis in the transverse aorta. No pleural effusion or pneumothorax. Clear lungs. IMPRESSION: No acute cardiopulmonary disease. Aortic atherosclerosis. Electronically Signed   By: Abigail Miyamoto M.D.   On: 08/21/2015 13:13    MDM   patient stated her heart was racing even when she was in sinus rhythm at 70 bpm on cardiac monitor she had no evidence of tachycardia while here. I do believe that she suffers from anxiety. No evidence of acute coronary syndrome.  Plan prescription Reglan follow-up with Dr. Sharlet Salina  Diagnosis #1 palpitations  #2 nausea  #3 hyperglycemia  Final diagnoses:  None        Orlie Dakin, MD 09/08/15 8916  Orlie Dakin, MD 09/08/15 9450

## 2015-09-12 ENCOUNTER — Encounter (HOSPITAL_BASED_OUTPATIENT_CLINIC_OR_DEPARTMENT_OTHER): Payer: Self-pay | Admitting: Emergency Medicine

## 2015-09-13 ENCOUNTER — Telehealth: Payer: Self-pay | Admitting: Internal Medicine

## 2015-09-14 ENCOUNTER — Encounter: Payer: Self-pay | Admitting: Family

## 2015-09-14 ENCOUNTER — Other Ambulatory Visit: Payer: 59

## 2015-09-14 ENCOUNTER — Ambulatory Visit (INDEPENDENT_AMBULATORY_CARE_PROVIDER_SITE_OTHER): Payer: 59 | Admitting: Family

## 2015-09-14 VITALS — BP 138/78 | HR 85 | Temp 85.0°F | Wt 131.8 lb

## 2015-09-14 DIAGNOSIS — R110 Nausea: Secondary | ICD-10-CM

## 2015-09-14 DIAGNOSIS — F411 Generalized anxiety disorder: Secondary | ICD-10-CM | POA: Diagnosis not present

## 2015-09-14 LAB — HEPATITIS C ANTIBODY: HCV Ab: NEGATIVE

## 2015-09-14 MED ORDER — CLONAZEPAM 0.5 MG PO TABS: 0.5000 mg | ORAL_TABLET | Freq: Two times a day (BID) | ORAL | Status: DC | PRN

## 2015-09-14 MED ORDER — ONDANSETRON 8 MG PO TBDP: 8.0000 mg | ORAL_TABLET | Freq: Three times a day (TID) | ORAL | Status: DC | PRN

## 2015-09-14 NOTE — Patient Instructions (Signed)
Thank you for choosing [facility].  Summary/Instructions:  Your prescription(s) have been submitted to your pharmacy or been printed and provided for you. Please take as directed and contact our office if you believe you are having problem(s) with the medication(s) or have any questions.  If your symptoms worsen or fail to improve, please contact our office for further instruction, or in case of emergency go directly to the emergency room at the closest medical facility.    Panic Attacks Panic attacks are sudden, short-livedsurges of severe anxiety, fear, or discomfort. They may occur for no reason when you are relaxed, when you are anxious, or when you are sleeping. Panic attacks may occur for a number of reasons:   Healthy people occasionally have panic attacks in extreme, life-threatening situations, such as war or natural disasters. Normal anxiety is a protective mechanism of the body that helps [place] react to danger (fight or flight response).  Panic attacks are often seen with anxiety disorders, such as panic disorder, social anxiety disorder, generalized anxiety disorder, and phobias. Anxiety disorders cause excessive or uncontrollable anxiety. They may interfere with your relationships or other life activities.  Panic attacks are sometimes seen with other mental illnesses, such as depression and posttraumatic stress disorder.  Certain medical conditions, prescription medicines, and drugs of abuse can cause panic attacks. SYMPTOMS  Panic attacks start suddenly, peak within 20 minutes, and are accompanied by four or more of the following symptoms:  Pounding heart or fast heart rate (palpitations).  Sweating.  Trembling or shaking.  Shortness of breath or feeling smothered.  Feeling choked.  Chest pain or discomfort.  Nausea or strange feeling in your stomach.  Dizziness, light-headedness, or feeling like you will faint.  Chills or hot flushes.  Numbness or tingling  in your lips or hands and feet.  Feeling that things are not real or feeling that you are not yourself.  Fear of losing control or going crazy.  Fear of dying. Some of these symptoms can mimic serious medical conditions. For example, you may think you are having a heart attack. Although panic attacks can be very scary, they are not life threatening. DIAGNOSIS  Panic attacks are diagnosed through an assessment by your health care provider. Your health care provider will ask questions about your symptoms, such as where and when they occurred. Your health care provider will also ask about your medical history and use of alcohol and drugs, including prescription medicines. Your health care provider may order blood tests or other studies to rule out a serious medical condition. Your health care provider may refer you to a mental health professional for further evaluation. TREATMENT   Most healthy people who have one or two panic attacks in an extreme, life-threatening situation will not require treatment.  The treatment for panic attacks associated with anxiety disorders or other mental illness typically involves counseling with a mental health professional, medicine, or a combination of both. Your health care provider will help determine what treatment is best for you.  Panic attacks due to physical illness usually go away with treatment of the illness. If prescription medicine is causing panic attacks, talk with your health care provider about stopping the medicine, decreasing the dose, or substituting another medicine.  Panic attacks due to alcohol or drug abuse go away with abstinence. Some adults need professional help in order to stop drinking or using drugs. HOME CARE INSTRUCTIONS   Take all medicines as directed by your health care provider.   Schedule  and attend follow-up visits as directed by your health care provider. It is important to keep all your appointments. SEEK MEDICAL CARE  IF:  You are not able to take your medicines as prescribed.  Your symptoms do not improve or get worse. SEEK IMMEDIATE MEDICAL CARE IF:   You experience panic attack symptoms that are different than your usual symptoms.  You have serious thoughts about hurting yourself or others.  You are taking medicine for panic attacks and have a serious side effect. MAKE SURE YOU:  Understand these instructions.  Will watch your condition.  Will get help right away if you are not doing well or get worse.   This information is not intended to replace advice given to you by your health care provider. Make sure you discuss any questions you have with your health care provider.   Document Released: 11/05/2005 Document Revised: 11/10/2013 Document Reviewed: 06/19/2013 Elsevier Interactive Patient Education 2016 Else[employer] Inc[employer]  Nausea, Adult Nausea is the feeling that you have an upset stomach or have to vomit. Nausea by itself is not likely a serious concern, but it may be an early sign of more serious medical problems. As nausea gets worse, it can lead to vomiting. If vomiting develops, there is the risk of dehydration.  CAUSES   Viral infections.  Food poisoning.  Medicines.  Pregnancy.  Motion sickness.  Migraine headaches.  Emotional distress.  Severe pain from any source.  Alcohol intoxication. HOME CARE INSTRUCTIONS  Get plenty of rest.  Ask your caregiver about specific rehydration instructions.  Eat small amounts of food and sip liquids more often.  Take all medicines as told by your caregiver. SEEK MEDICAL CARE IF:  You have not improved after 2 days, or you get worse.  You have a headache. SEEK IMMEDIATE MEDICAL CARE IF:   You have a fever.  You faint.  You keep vomiting or have blood in your vomit.  You are extremely weak or dehydrated.  You have dark or bloody stools.  You have severe chest or abdominal pain. MAKE SURE YOU:  Understand these  instructions.  Will watch your condition.  Will get help right away if you are not doing well or get worse.   This information is not intended to replace advice given to you by your health care provider. Make sure you discuss any questions you have with your health care provider.   Document Released: 12/13/2004 Document Revised: 11/26/2014 Document Reviewed: 07/18/2011 Elsevier Interactive Patient Education [employer].

## 2015-09-14 NOTE — Assessment & Plan Note (Addendum)
Symptoms and exam consistent with anxiety. Previous cardiac work up is negative including EKG and 2D echo. Abdominal exam is benign as described.  GAD 7 score is elevated. Question if nausea is causing anxiety or anxiety is causing nausea. Start klonopin. Continue Zofran as needed. Follow up after trial of medication to determine symptom reduction.

## 2015-09-14 NOTE — Progress Notes (Signed)
Subjective:    Patient ID: Michelle Robertson, female    DOB: [birth year], 64 y.o.   MRN: [id number]  Chief Complaint  Patient presents with  . Follow-up    ED - heart palpitations and nausea    HPI:  Michelle Robertson is a 64 y.o. female who  has a past medical history of Diabetes mellitus without complication ([place]) and Ectopic pregnancy. and presents today for an office follow-up after emergency room visit.  Recently evaluated in the emergency room for nausea and heart palpitations. Describes symptoms lasting approximately one hour and associated with hyperventilation. EKG showed normal sinus rhythm with premature ventricular contractions and a borderline T abnormalities which was consistent with previous tracings. She was noted to have a mild anemia with blood work and no other abnormalities. She was diagnosed with panic attacks and instructed to follow-up with primary care. All ED notes were reviewed in detail.  Symptoms of nausea and heart palpitations have been going on for about 1 month now and has been seen in the ED on multiple occasions as described above. Describes that the nausea "cripples her body" and has effected her sleep and work. Modifying factors include a OTC generic sleep aid which has helped her sleep and ondansetron which does help with the nausea. She also has Reglan which she describes does not help. Also notes having to use the bathroom. The most recent episode was yesterday with increased nausea with little heart palpitations. Describes that she is not able to work and is concerned about driving.   No Known Allergies   Current Outpatient Prescriptions on File Prior to Visit  Medication Sig Dispense Refill  . diclofenac sodium (VOLTAREN) 1 % GEL Apply 2 g topically 3 (three) times daily as needed. (Patient taking differently: Apply 2 g topically 3 (three) times daily as needed (pain). ) 100 g 6  . glucose blood (BAYER CONTOUR TEST) test strip 1 each by Other route 3 (three)  times daily. Use to check blood sugars three times of day Dx E11.9 100 each 11  . MECLIZINE HCL PO Take 2 tablets by mouth once.    . metFORMIN (GLUCOPHAGE) 1000 MG tablet Take 1 tablet (1,000 mg total) by mouth 2 (two) times daily with a meal. (Patient taking differently: Take 500 mg by mouth 2 (two) times daily with a meal. ) 180 tablet 3  . metoCLOPramide (REGLAN) 10 MG tablet Take 1 tablet (10 mg total) by mouth every 8 (eight) hours as needed for nausea. 6 tablet 0  . Tetrahydrozoline HCl (VISINE OP) Place 2 drops into both eyes as needed (for dry eyes).     No current facility-administered medications on file prior to visit.    Past Medical History  Diagnosis Date  . Diabetes mellitus without complication ([place])   . Ectopic pregnancy      Past Surgical History  Procedure Laterality Date  . Tubal ligation        Review of Systems  Constitutional: Negative for fever and chills.  Gastrointestinal: Positive for nausea. Negative for vomiting, abdominal pain, diarrhea, constipation and blood in stool.  Neurological: Positive for dizziness.  Psychiatric/Behavioral: The patient is nervous/anxious.       Objective:    BP 138/78 mmHg  Pulse 85  Temp(Src) 85 F (29.4 C)  Wt 131 lb 12 oz (59.761 kg)  SpO2 98% Nursing note and vital signs reviewed.  GAD 7 : Generalized Anxiety Score 09/14/2015  Nervous, Anxious, on Edge 3  Control/stop worrying 3  Worry too much - different things 1  Trouble relaxing 1  Restless 1  Easily annoyed or irritable 2  Afraid - awful might happen 3  Total GAD 7 Score 14  Anxiety Difficulty Somewhat difficult    Physical Exam  Constitutional: She is oriented to person, place, and time. She appears well-developed and well-nourished. No distress.  Cardiovascular: Normal rate, regular rhythm, normal heart sounds and intact distal pulses.   Pulmonary/Chest: Effort normal and breath sounds normal.  Abdominal: Soft. Normal appearance and bowel  sounds are normal. There is no hepatosplenomegaly. There is no tenderness. There is no rigidity, no rebound, no guarding, no tenderness at McBurney's point and negative Murphy's sign.  Neurological: She is alert and oriented to person, place, and time.  Skin: Skin is warm and dry.  Psychiatric: She has a normal mood and affect. Her behavior is normal. Judgment and thought content normal.       Assessment & Plan:   Problem List Items Addressed This Visit      Other   Nausea without vomiting    Abdominal exam is benign today with no underlying pathology. Continue Zofran as needed for nausea. Instructed to start bowel regimen secondary to potential for constipation with Zofran. Follow up pending trial of klonopin for anxiety to see if nausea symptoms are reduced. Follow up with PCP as scheduled or sooner for worsening symptoms.       Relevant Medications   ondansetron (ZOFRAN ODT) 8 MG disintegrating tablet   Other Relevant Orders   Hepatitis C antibody   GAD (generalized anxiety disorder) - Primary    Symptoms and exam consistent with anxiety. Previous cardiac work up is negative including EKG and 2D echo. Abdominal exam is benign as described.  GAD 7 score is elevated. Question if nausea is causing anxiety or anxiety is causing nausea. Start klonopin. Continue Zofran as needed. Follow up after trial of medication to determine symptom reduction.       Relevant Medications   clonazePAM (KLONOPIN) 0.5 MG tablet

## 2015-09-14 NOTE — Assessment & Plan Note (Signed)
Abdominal exam is benign today with no underlying pathology. Continue Zofran as needed for nausea. Instructed to start bowel regimen secondary to potential for constipation with Zofran. Follow up pending trial of klonopin for anxiety to see if nausea symptoms are reduced. Follow up with PCP as scheduled or sooner for worsening symptoms.

## 2015-09-14 NOTE — Progress Notes (Signed)
Pre visit review using our clinic review tool, if applicable. No additional management support is needed unless otherwise documented below in the visit note. 

## 2015-09-15 ENCOUNTER — Telehealth: Payer: Self-pay | Admitting: *Deleted

## 2015-09-15 ENCOUNTER — Encounter: Payer: Self-pay | Admitting: Family

## 2015-09-15 MED ORDER — ONDANSETRON HCL 4 MG PO TABS: 4.0000 mg | ORAL_TABLET | Freq: Three times a day (TID) | ORAL | Status: DC | PRN

## 2015-09-15 NOTE — Telephone Encounter (Signed)
Have sent in zofran pills instead of the dissolving kind that should be covered by insurance. Would not recommend the emetrol since it is basically sugar water.

## 2015-09-15 NOTE — Telephone Encounter (Signed)
Receive call pt states Michelle Robertson Rx zofran for her on yesterday & her insurance will not cover. The pharmacist suggest that she take Emetrol over the counter for nausea. Wanting to ask if its ok for her to take since she is a diabetic...Johny Chess

## 2015-09-15 NOTE — Telephone Encounter (Signed)
Notified pt with md response.../lmb 

## 2015-09-20 ENCOUNTER — Inpatient Hospital Stay: Payer: 59 | Admitting: Internal Medicine

## 2015-09-28 ENCOUNTER — Encounter: Payer: Self-pay | Admitting: Internal Medicine

## 2015-10-12 ENCOUNTER — Other Ambulatory Visit: Payer: Self-pay | Admitting: Family

## 2015-10-12 ENCOUNTER — Other Ambulatory Visit: Payer: Self-pay | Admitting: Internal Medicine

## 2015-10-14 NOTE — Telephone Encounter (Signed)
Please advise, you saw pt on 09/14/15

## 2015-10-20 ENCOUNTER — Ambulatory Visit (INDEPENDENT_AMBULATORY_CARE_PROVIDER_SITE_OTHER): Payer: 59 | Admitting: Internal Medicine

## 2015-10-20 ENCOUNTER — Encounter: Payer: Self-pay | Admitting: Internal Medicine

## 2015-10-20 VITALS — BP 132/78 | HR 83 | Temp 98.0°F | Resp 14 | Ht 62.5 in | Wt 131.8 lb

## 2015-10-20 DIAGNOSIS — F411 Generalized anxiety disorder: Secondary | ICD-10-CM | POA: Diagnosis not present

## 2015-10-20 MED ORDER — FLUOXETINE HCL 20 MG PO TABS: 20.0000 mg | ORAL_TABLET | Freq: Every day | ORAL | Status: DC

## 2015-10-20 NOTE — Patient Instructions (Signed)
We have sent in the medicine that you take everyday that helps with the anxiety. It is called fluoxetine. Take 1 pill daily. This medicine does take about 4 weeks to get into your system. Call us back in about 2-3 weeks and let us know how you are doing.   If you have any problems with the medicine or side effects please call the office.   If we are not able to get this sorted out we may need to run some other tests for more rare causes of the nausea and palpitations.

## 2015-10-20 NOTE — Progress Notes (Signed)
   Subjective:    Patient ID: Michelle Robertson, female    DOB: 09-28-1951, 64 y.o.   MRN: [id number]  HPI The patient is a 64 YO female coming in for attacks where she gets fast heart rate, nausea, and pressure in her chest. She has been treated for panic attacks with clonazepam which is partially helpful. It does make her sleepy and not able to drive or work well. This concerns her since she needs to be alert at her job to be effective. She denies new stressors in her life right now. Has only had 2 major attacks. If she takes the clonazepam and the zofran she is able to head them off and them are only mild. She takes it 2-3 times per week. She worries about it everyday.   Review of Systems  Constitutional: Positive for activity change. Negative for fever, appetite change, fatigue and unexpected weight change.  Respiratory: Positive for chest tightness and shortness of breath. Negative for cough and wheezing.   Cardiovascular: Positive for palpitations. Negative for chest pain and leg swelling.  Gastrointestinal: Positive for nausea. Negative for abdominal pain, diarrhea, constipation and abdominal distention.  Musculoskeletal: Negative.   Skin: Negative.   Psychiatric/Behavioral: Positive for dysphoric mood. Negative for behavioral problems, sleep disturbance and decreased concentration. The patient is nervous/anxious.       Objective:   Physical Exam  Constitutional: She is oriented to person, place, and time. She appears well-developed and well-nourished.  HENT:  Head: Normocephalic and atraumatic.  Eyes: EOM are normal.  Neck: Normal range of motion.  Cardiovascular: Normal rate and regular rhythm.   Pulmonary/Chest: Effort normal and breath sounds normal. No respiratory distress. She has no wheezes.  Abdominal: Soft. Bowel sounds are normal. She exhibits no distension. There is no tenderness. There is no rebound.  Musculoskeletal: She exhibits no edema.  Neurological: She is alert and  oriented to person, place, and time.  Skin: Skin is warm and dry.  Psychiatric:  Slightly anxious   Filed Vitals:   10/20/15 1448  BP: 132/78  Pulse: 83  Temp: 98 F (36.7 C)  TempSrc: Oral  Resp: 14  Height: 5' 2.5" (1.588 m)  Weight: 131 lb 12.8 oz (59.784 kg)  SpO2: 98%      Assessment & Plan:

## 2015-10-20 NOTE — Progress Notes (Signed)
Pre visit review using our clinic review tool, if applicable. No additional management support is needed unless otherwise documented below in the visit note. 

## 2015-10-21 NOTE — Assessment & Plan Note (Signed)
It is unclear to me if anxiety is the cause of her episodes. She does not have any new life stressors. She has been using clonazepam successfully for her symptoms. She is now having some generalized mood disorder over these episodes. Will try fluoxetine for the symptoms to see if she can stop using the clonazepam. If she does not get improvement of her symptoms it may be worthwhile to do 24 hour urine to rule out endocrine source such as pehochromocytoma.

## 2015-11-20 DIAGNOSIS — I491 Atrial premature depolarization: Secondary | ICD-10-CM

## 2015-11-20 DIAGNOSIS — R002 Palpitations: Secondary | ICD-10-CM

## 2015-11-20 HISTORY — DX: Palpitations: R00.2

## 2015-11-20 HISTORY — DX: Atrial premature depolarization: I49.1

## 2015-12-12 ENCOUNTER — Other Ambulatory Visit: Payer: Self-pay | Admitting: Geriatric Medicine

## 2015-12-12 MED ORDER — ONDANSETRON HCL 4 MG PO TABS
ORAL_TABLET | ORAL | Status: DC
Start: 2015-12-12 — End: 2016-03-12

## 2015-12-13 ENCOUNTER — Telehealth: Payer: Self-pay

## 2015-12-13 MED ORDER — CLONAZEPAM 0.5 MG PO TABS: 0.5000 mg | ORAL_TABLET | Freq: Two times a day (BID) | ORAL | Status: DC | PRN

## 2015-12-13 NOTE — Telephone Encounter (Signed)
Rx faxed

## 2015-12-13 NOTE — Telephone Encounter (Signed)
Pt requesting refill of clonazepam 0.5 mg. Last refill was 10/14/15. Please advise

## 2015-12-13 NOTE — Telephone Encounter (Signed)
Printed and signed.  

## 2015-12-29 ENCOUNTER — Ambulatory Visit: Payer: Self-pay | Admitting: Internal Medicine

## 2016-01-05 ENCOUNTER — Encounter: Payer: Self-pay | Admitting: Internal Medicine

## 2016-01-05 ENCOUNTER — Ambulatory Visit (INDEPENDENT_AMBULATORY_CARE_PROVIDER_SITE_OTHER): Payer: BLUE CROSS/BLUE SHIELD | Admitting: Internal Medicine

## 2016-01-05 ENCOUNTER — Other Ambulatory Visit (INDEPENDENT_AMBULATORY_CARE_PROVIDER_SITE_OTHER): Payer: BLUE CROSS/BLUE SHIELD

## 2016-01-05 VITALS — BP 120/58 | HR 91 | Temp 98.4°F | Resp 12 | Ht 62.5 in | Wt 131.4 lb

## 2016-01-05 DIAGNOSIS — E1165 Type 2 diabetes mellitus with hyperglycemia: Secondary | ICD-10-CM

## 2016-01-05 DIAGNOSIS — R002 Palpitations: Secondary | ICD-10-CM | POA: Diagnosis not present

## 2016-01-05 DIAGNOSIS — IMO0001 Reserved for inherently not codable concepts without codable children: Secondary | ICD-10-CM

## 2016-01-05 LAB — COMPREHENSIVE METABOLIC PANEL
ALT: 8 U/L (ref 0–35)
AST: 16 U/L (ref 0–37)
Albumin: 4.8 g/dL (ref 3.5–5.2)
Alkaline Phosphatase: 54 U/L (ref 39–117)
BUN: 20 mg/dL (ref 6–23)
CO2: 27 mEq/L (ref 19–32)
Calcium: 10 mg/dL (ref 8.4–10.5)
Chloride: 105 mEq/L (ref 96–112)
Creatinine, Ser: 1.19 mg/dL (ref 0.40–1.20)
GFR: 59.01 mL/min — ABNORMAL LOW (ref >60.00)
Glucose, Bld: 115 mg/dL — ABNORMAL HIGH (ref 70–99)
Potassium: 4.2 mEq/L (ref 3.5–5.1)
Sodium: 141 mEq/L (ref 135–145)
Total Bilirubin: 0.3 mg/dL (ref 0.2–1.2)
Total Protein: 7.8 g/dL (ref 6.0–8.3)

## 2016-01-05 LAB — HEMOGLOBIN A1C: HEMOGLOBIN A1C: 6.9 % — AB (ref 4.6–6.5)

## 2016-01-05 NOTE — Progress Notes (Signed)
   Subjective:    Patient ID: Michelle Robertson, female    DOB: 05-10-1951, 65 y.o.   MRN: [id number]  HPI The patient is a 65 YO female coming in for palpitations still. She did not pick up the prozac as she feels that she has linked the episodes to eating. If she eats more than a certain amount she will get the episode. The clonazepam can help them but she does not want to keep taking them. She wants to find the cause. She has not been able to work due to these since October. Nausea is improved.   Review of Systems  Constitutional: Positive for activity change. Negative for fever, appetite change, fatigue and unexpected weight change.  Respiratory: Positive for chest tightness and shortness of breath. Negative for cough and wheezing.   Cardiovascular: Positive for palpitations. Negative for chest pain and leg swelling.  Gastrointestinal: Negative for nausea, abdominal pain, diarrhea, constipation and abdominal distention.  Musculoskeletal: Negative.   Skin: Negative.   Psychiatric/Behavioral: Negative for behavioral problems, sleep disturbance, dysphoric mood and decreased concentration. The patient is nervous/anxious.       Objective:   Physical Exam  Constitutional: She is oriented to person, place, and time. She appears well-developed and well-nourished.  HENT:  Head: Normocephalic and atraumatic.  Eyes: EOM are normal.  Neck: Normal range of motion.  Cardiovascular: Normal rate and regular rhythm.   Pulmonary/Chest: Effort normal and breath sounds normal. No respiratory distress. She has no wheezes.  Abdominal: Soft. Bowel sounds are normal. She exhibits no distension. There is no tenderness. There is no rebound.  Musculoskeletal: She exhibits no edema.  Neurological: She is alert and oriented to person, place, and time.  Skin: Skin is warm and dry.   Filed Vitals:   01/05/16 1303  BP: 120/58  Pulse: 91  Temp: 98.4 F (36.9 C)  TempSrc: Oral  Resp: 12  Height: 5' 2.5" (1.588  m)  Weight: 131 lb 6.4 oz (59.603 kg)  SpO2: 98%      Assessment & Plan:

## 2016-01-05 NOTE — Assessment & Plan Note (Signed)
Due to other acute issues has not been followed in some time. Checking Hga1c today as last was high.

## 2016-01-05 NOTE — Progress Notes (Signed)
Pre visit review using our clinic review tool, if applicable. No additional management support is needed unless otherwise documented below in the visit note. 

## 2016-01-05 NOTE — Assessment & Plan Note (Signed)
Checking 24 hour urine for metanephrines and catecholamines and referral to cardiology for these palpitation episodes. EKG reviewed and normal from October at the onset of these episodes.

## 2016-01-05 NOTE — Patient Instructions (Signed)
We will do the 24 hour urine collection to check for the rare disease causing the heart racing. We will also send you to the heart doctor.   For the urine, the first pee of the morning go in the toilet, after that pee in the jug all day. If you get up in the night pee in the jug. When you wake up in the morning pee in the jug first thing and this completes the collection.   24-Hour Urine Collection HOW DO I DO A 24-HOUR URINE COLLECTION?  When you get up in the morning, urinate in the toilet and flush. Write down the time. This will be your start time on the day of collection and your end time on the next morning.  From then on, collect all of your urine in the plastic jug that is given to you.  Stop collecting your urine 24 hours after you started.  If the plastic jug that is given to you already has liquid in it, that is okay. Do not throw out the liquid or rinse out the jug. Some tests need the liquid to be added to your urine.  Keep your plastic jug cool in an ice chest or keep it in the refrigerator during the test.  When 24 hours are over, bring your plastic jug to the clinic lab. Keep the jug cool in an ice chest while you are bringing it to the lab.   This information is not intended to replace advice given to you by your health care provider. Make sure you discuss any questions you have with your health care provider.   Document Released: 02/01/2009 Document Revised: 11/26/2014 Document Reviewed: 03/31/2014 Elsevier Interactive Patient Education [employer].

## 2016-01-11 ENCOUNTER — Other Ambulatory Visit: Payer: BLUE CROSS/BLUE SHIELD

## 2016-01-11 DIAGNOSIS — R002 Palpitations: Secondary | ICD-10-CM

## 2016-01-15 LAB — CATECHOLAMINES, FRACTIONATED, URINE, 24 HOUR
Calculated Total (E+NE): 12 mcg/24 h — ABNORMAL LOW (ref 26–121)
Creatinine, Urine mg/day-CATEUR: 0.64 g/(24.h) (ref 0.63–2.50)
Dopamine, 24 hr Urine: 133 mcg/24 h (ref 52–480)
Norepinephrine, 24 hr Ur: 12 mcg/24 h — ABNORMAL LOW (ref 15–100)
Total Volume - CF 24Hr U: 500 mL

## 2016-01-16 LAB — METANEPHRINES, URINE, 24 HOUR
METANEPH TOTAL UR: 179 ug/(24.h) — AB (ref 224–832)
Metanephrines, Ur: 40 mcg/24 h — ABNORMAL LOW (ref 90–315)
NORMETANEPHRINE 24H UR: 139 ug/(24.h) (ref 122–676)

## 2016-01-17 ENCOUNTER — Telehealth: Payer: Self-pay | Admitting: *Deleted

## 2016-01-17 MED ORDER — GLUCOSE BLOOD VI STRP: 1.0000 | ORAL_STRIP | Freq: Three times a day (TID) | Status: DC

## 2016-01-17 NOTE — Telephone Encounter (Signed)
Left msg on triage stating needing refills on test strips "contour". Called pt verified frequency inform sending to [facility].....Michelle Robertson

## 2016-01-20 ENCOUNTER — Telehealth: Payer: Self-pay | Admitting: Internal Medicine

## 2016-01-20 NOTE — Telephone Encounter (Signed)
Pt requesting refills for metFORMIN (GLUCOPHAGE) 1000 MG tablet [id number] , clonazePAM (KLONOPIN) 0.5 MG tablet [id number] and ondansetron (ZOFRAN) 4 MG tablet [id number] Pharmacy is [facility] on [address]

## 2016-01-27 ENCOUNTER — Emergency Department (HOSPITAL_COMMUNITY): Payer: BLUE CROSS/BLUE SHIELD

## 2016-01-27 ENCOUNTER — Encounter (HOSPITAL_COMMUNITY): Payer: Self-pay | Admitting: *Deleted

## 2016-01-27 DIAGNOSIS — R002 Palpitations: Secondary | ICD-10-CM | POA: Diagnosis present

## 2016-01-27 DIAGNOSIS — Z7984 Long term (current) use of oral hypoglycemic drugs: Secondary | ICD-10-CM | POA: Insufficient documentation

## 2016-01-27 DIAGNOSIS — E119 Type 2 diabetes mellitus without complications: Secondary | ICD-10-CM | POA: Insufficient documentation

## 2016-01-27 DIAGNOSIS — I493 Ventricular premature depolarization: Secondary | ICD-10-CM | POA: Diagnosis not present

## 2016-01-27 DIAGNOSIS — R110 Nausea: Secondary | ICD-10-CM | POA: Diagnosis not present

## 2016-01-27 DIAGNOSIS — R030 Elevated blood-pressure reading, without diagnosis of hypertension: Secondary | ICD-10-CM | POA: Insufficient documentation

## 2016-01-27 LAB — BASIC METABOLIC PANEL
Anion gap: 13 (ref 5–15)
BUN: 16 mg/dL (ref 6–20)
CALCIUM: 9.8 mg/dL (ref 8.9–10.3)
CO2: 23 mmol/L (ref 22–32)
CREATININE: 0.97 mg/dL (ref 0.44–1.00)
Chloride: 105 mmol/L (ref 101–111)
GFR calc non Af Amer: >60 mL/min (ref >60)
Glucose, Bld: 143 mg/dL — ABNORMAL HIGH (ref 65–99)
Potassium: 3.8 mmol/L (ref 3.5–5.1)
SODIUM: 141 mmol/L (ref 135–145)

## 2016-01-27 LAB — CBC
HCT: 34 % — ABNORMAL LOW (ref 36.0–46.0)
Hemoglobin: 11.2 g/dL — ABNORMAL LOW (ref 12.0–15.0)
MCH: 28.6 pg (ref 26.0–34.0)
MCHC: 32.9 g/dL (ref 30.0–36.0)
MCV: 86.7 fL (ref 78.0–100.0)
PLATELETS: 338 10*3/uL (ref 150–400)
RBC: 3.92 MIL/uL (ref 3.87–5.11)
RDW: 13.5 % (ref 11.5–15.5)
WBC: 9.8 10*3/uL (ref 4.0–10.5)

## 2016-01-27 NOTE — ED Notes (Signed)
Pt in c/o palpitations and nausea that started this evening, states this has been happening off and on for a few months and she has seen her PCP, symptoms started about an hour ago, denies pain, no distress noted

## 2016-01-28 ENCOUNTER — Emergency Department (HOSPITAL_COMMUNITY)
Admission: EM | Admit: 2016-01-28 | Discharge: 2016-01-28 | Disposition: A | Discharge status: Home / Self Care | Payer: BLUE CROSS/BLUE SHIELD | Attending: Emergency Medicine | Admitting: Emergency Medicine

## 2016-01-28 DIAGNOSIS — I493 Ventricular premature depolarization: Secondary | ICD-10-CM

## 2016-01-28 DIAGNOSIS — R002 Palpitations: Secondary | ICD-10-CM

## 2016-01-28 NOTE — ED Notes (Signed)
Pt stable, ambulatory, states understanding of discharge instructions 

## 2016-01-28 NOTE — Discharge Instructions (Signed)
Palpitations A palpitation is the feeling that your heartbeat is irregular or is faster than normal. It may feel like your heart is fluttering or skipping a beat. Palpitations are usually not a serious problem. However, in some cases, you may need further medical evaluation. CAUSES  Palpitations can be caused by:  Smoking.  Caffeine or other stimulants, such as diet pills or energy drinks.  Alcohol.  Stress and anxiety.  Strenuous physical activity.  Fatigue.  Certain medicines.  Heart disease, especially if you have a history of irregular heart rhythms (arrhythmias), such as atrial fibrillation, atrial flutter, or supraventricular tachycardia.  An improperly working pacemaker or defibrillator. DIAGNOSIS  To find the cause of your palpitations, your health care provider will take your medical history and perform a physical exam. Your health care provider may also have you take a test called an ambulatory electrocardiogram (ECG). An ECG records your heartbeat patterns over a 24-hour period. You may also have other tests, such as:  Transthoracic echocardiogram (TTE). During echocardiography, sound waves are used to evaluate how blood flows through your heart.  Transesophageal echocardiogram (TEE).  Cardiac monitoring. This allows your health care provider to monitor your heart rate and rhythm in real time.  Holter monitor. This is a portable device that records your heartbeat and can help diagnose heart arrhythmias. It allows your health care provider to track your heart activity for several days, if needed.  Stress tests by exercise or by giving medicine that makes the heart beat faster. TREATMENT  Treatment of palpitations depends on the cause of your symptoms and can vary greatly. Most cases of palpitations do not require any treatment other than time, relaxation, and monitoring your symptoms. Other causes, such as atrial fibrillation, atrial flutter, or supraventricular  tachycardia, usually require further treatment. HOME CARE INSTRUCTIONS   Avoid:  Caffeinated coffee, tea, soft drinks, diet pills, and energy drinks.  Chocolate.  Alcohol.  Stop smoking if you smoke.  Reduce your stress and anxiety. Things that can help you relax include:  A method of controlling things in your body, such as your heartbeats, with your mind (biofeedback).  Yoga.  Meditation.  Physical activity such as swimming, jogging, or walking.  Get plenty of rest and sleep. SEEK MEDICAL CARE IF:   You continue to have a fast or irregular heartbeat beyond 24 hours.  Your palpitations occur more often. SEEK IMMEDIATE MEDICAL CARE IF:  You have chest pain or shortness of breath.  You have a severe headache.  You feel dizzy or you faint. MAKE SURE YOU:  Understand these instructions.  Will watch your condition.  Will get help right away if you are not doing well or get worse.   This information is not intended to replace advice given to you by your health care provider. Make sure you discuss any questions you have with your health care provider.   Document Released: 11/02/2000 Document Revised: 11/10/2013 Document Reviewed: 01/04/2012 Elsevier Interactive Patient Education 2016 Elsevier Inc[employer] Premature Ventricular Contraction A premature ventricular contraction is an irregularity in the normal heart rhythm. These contractions are extra heartbeats that occur too early in the normal sequence. In most cases, these contractions are harmless and do not require treatment. CAUSES Premature ventricular contractions may occur without a known cause. In healthy people, the extra contractions may be caused by:  Smoking.  Drinking alcohol.  Caffeine.  Certain medicines.  Some illegal drugs.  Stress. Sometimes, changes in chemicals in the blood (electrolytes) can also cause premature ventricular  contractions. They can also occur in people with heart diseases that  cause a decrease in blood flow to the heart. SIGNS AND SYMPTOMS Premature ventricular contractions often do not cause any symptoms. In some cases, you may have a feeling of your heart beating fast or skipping a beat (palpitations). DIAGNOSIS Your health care provider will take your medical history and do a physical exam. During the exam, the health care provider will check for irregular heartbeats. Various tests may be done to help diagnose premature ventricular contractions. These tests may include:  An ECG (electrocardiogram) to monitor the electrical activity of your heart.  Holter monitor testing. A Holter monitor is a portable device that can monitor the electrical activity of your heart over longer periods of time.  Stress tests to see how exercise affects your heart rhythm.  Echocardiogram. This test uses sound waves (ultrasound) to produce an image of your heart.  Electrophysiology study. This is used to evaluate the electrical conduction system of your heart. TREATMENT Usually, no treatment is needed. You may be advised to avoid things that can trigger the premature contractions, such as caffeine or alcohol. Medicines are sometimes given if symptoms are severe or if the extra heartbeats are very frequent. Treatment may also be needed for an underlying cause of the contractions if one is found. HOME CARE INSTRUCTIONS  Take medicines only as directed by your health care provider.  Make any lifestyle changes recommended by your health care provider. These may include:  Quitting smoking.  Avoiding or limiting caffeine or alcohol.  Exercising. Talk to your health care provider about what type of exercise is safe for you.  Trying to reduce stress.  Keep all follow-up visits with your health care provider. This is important. SEEK IMMEDIATE MEDICAL CARE IF:  You feel palpitations that are frequent or continual.  You have chest pain.  You have shortness of breath.  You have  sweating for no reason.  You have nausea and vomiting.  You become light-headed or faint.   This information is not intended to replace advice given to you by your health care provider. Make sure you discuss any questions you have with your health care provider.   Document Released: 06/22/2004 Document Revised: 11/26/2014 Document Reviewed: 04/08/2014 Elsevier Interactive Patient Education [employer].

## 2016-01-28 NOTE — ED Provider Notes (Signed)
CSN: [id number]     Arrival date & time 01/27/16  1940 History  By signing my name below, I, Hansel Feinstein, attest that this documentation has been prepared under the direction and in the presence of Jola Schmidt, MD. Electronically Signed: Hansel Feinstein, ED Scribe. 01/28/2016. 1:12 AM.    Chief Complaint  Patient presents with  . Palpitations   The history is provided by the patient. No language interpreter was used.    HPI Comments: Michelle Robertson is a 65 y.o. female with h/o DM who presents to the Emergency Department complaining of a recurrent sensation that her heart is racing onset this evening with associated nausea, elevated BP, chest tightness. She reports that the palpitations feel like they are deep and throbbing in her chest. She notes that she has not checked her pulse during these episodes. Pt states her palpitations are sudden onset and are followed by nausea, chest tightness and anxiety. She notes that her symptoms have improved significantly since arrival, but notes some residual nausea. Pt denies having any modifying factors. Pt states her palpitations initially began 5 months ago and she has been evaluated by her PCP and given rx for anxiety. Pt states this treatment has provided little to no relief of episodes. She states that she has her first appointment with cardiology in 3 days. She states she does not consume caffeine or tea. Pt is a non-smoker. She denies lightheadedness, syncope, SOB.   Past Medical History  Diagnosis Date  . Diabetes mellitus without complication ([place])   . Ectopic pregnancy    Past Surgical History  Procedure Laterality Date  . Tubal ligation     Family History  Problem Relation Age of Onset  . Diabetes Mother   . Diabetes Maternal Aunt   . Diabetes Paternal Grandmother    Social History  Substance Use Topics  . Smoking status: Never Smoker   . Smokeless tobacco: Never Used  . Alcohol Use: No   OB History    No data available     Review of  Systems A complete 10 system review of systems was obtained and all systems are negative except as noted in the HPI and PMH.    Allergies  Review of patient's allergies indicates no known allergies.  Home Medications   Prior to Admission medications   Medication Sig Start Date End Date Taking? Authorizing Provider  Cholecalciferol (VITAMIN D) 2000 units CAPS Take by mouth.    Historical Provider, MD  clonazePAM (KLONOPIN) 0.5 MG tablet Take 1 tablet (0.5 mg total) by mouth 2 (two) times daily as needed. for anxiety 12/13/15   Hoyt Koch, MD  FLUoxetine (PROZAC) 20 MG tablet Take 1 tablet (20 mg total) by mouth daily. Patient not taking: Reported on 01/05/2016 10/20/15   Hoyt Koch, MD  glucose blood (BAYER CONTOUR TEST) test strip 1 each by Other route 2 (two) times daily. Use to check blood sugars twice a day Dx E11.9    Historical Provider, MD  glucose blood (BAYER CONTOUR TEST) test strip 1 each by Other route 3 (three) times daily. Use to check blood sugars three times of day Dx E11.9 01/17/16   Hoyt Koch, MD  metFORMIN (GLUCOPHAGE) 1000 MG tablet Take 1 tablet (1,000 mg total) by mouth 2 (two) times daily with a meal. Patient taking differently: Take 500 mg by mouth 2 (two) times daily with a meal.  07/21/15   Hoyt Koch, MD  ondansetron (ZOFRAN) 4 MG tablet TAKE  ONE TABLET BY MOUTH EVERY 8 HOURS AS NEEDED FOR NAUSEA OR  VOMITING 12/12/15   Hoyt Koch, MD  Tetrahydrozoline HCl (VISINE OP) Place 2 drops into both eyes as needed (for dry eyes).    Historical Provider, MD   BP 161/95 mmHg  Pulse 66  Temp(Src) 98.4 F (36.9 C) (Oral)  Resp 16  Wt 124 lb (56.246 kg)  SpO2 100% Physical Exam  Constitutional: She is oriented to person, place, and time. She appears well-developed and well-nourished. No distress.  HENT:  Head: Normocephalic and atraumatic.  Eyes: EOM are normal.  Neck: Normal range of motion.  Cardiovascular: Normal rate,  regular rhythm and normal heart sounds.  Exam reveals no gallop and no friction rub.   No murmur heard. Pulmonary/Chest: Effort normal and breath sounds normal. No respiratory distress. She has no wheezes. She has no rales.  Abdominal: Soft. She exhibits no distension. There is no tenderness.  Musculoskeletal: Normal range of motion.  Neurological: She is alert and oriented to person, place, and time.  Skin: Skin is warm and dry.  Psychiatric: She has a normal mood and affect. Judgment normal.  Nursing note and vitals reviewed.   ED Course  Procedures (including critical care time) DIAGNOSTIC STUDIES: Oxygen Saturation is 100% on RA, normal by my interpretation.    COORDINATION OF CARE: 1:08 AM Discussed treatment plan with pt at bedside which includes lab work, CXR and pt agreed to plan.   Labs Review Labs Reviewed  BASIC METABOLIC PANEL - Abnormal; Notable for the following:    Glucose, Bld 143 (*)    All other components within normal limits  CBC - Abnormal; Notable for the following:    Hemoglobin 11.2 (*)    HCT 34.0 (*)    All other components within normal limits  I-STAT TROPOININ, ED    Imaging Review Dg Chest 2 View  01/27/2016  CLINICAL DATA:  Chest pain and heart palpitations. Nausea. Symptoms for a few hours. EXAM: CHEST  2 VIEW COMPARISON:  08/21/2015 FINDINGS: Unchanged mild elevation of right hemidiaphragm. The cardiomediastinal contours are normal. The lungs are clear. Pulmonary vasculature is normal. No consolidation, pleural effusion, or pneumothorax. No acute osseous abnormalities are seen. IMPRESSION: No acute pulmonary process. Electronically Signed   By: Jeb Levering M.D.   On: 01/27/2016 20:32   I have personally reviewed and evaluated these images and lab results as part of my medical decision-making.   EKG Interpretation   Date/Time:  Friday January 27 2016 19:50:50 EST Ventricular Rate:  92 PR Interval:  160 QRS Duration: 78 QT Interval:   338 QTC Calculation: 417 R Axis:   40 Text Interpretation:  Sinus rhythm with occasional Premature ventricular  complexes Nonspecific T wave abnormality Abnormal ECG No significant  change was found pvc noted Confirmed by Viren Lebeau  MD, Jsean Taussig ([id number]) on  01/28/2016 1:05:00 AM      MDM   Final diagnoses:  None    Patient with complaints of palpitations for several months.  She is noted to have PVCs on occasion in the emergency department.  I suspect these are what she feels.  She's never been tachycardic when she is seeking medical care or had an ambulance come.  She is scheduled follow-up with cardiology in 3 days.  I've asked that they talk to them about the possibility of a Holter monitor.  I do not think she needs to be started on medications at this time.  We discussed caffeine and other  stimulants.  Discharge home in good condition.  Primary care follow-up.  Outpatient cardiology follow-up scheduled already  I personally performed the services described in this documentation, which was scribed in my presence. The recorded information has been reviewed and is accurate.       Jola Schmidt, MD 01/28/16 [id number]

## 2016-01-30 ENCOUNTER — Ambulatory Visit (INDEPENDENT_AMBULATORY_CARE_PROVIDER_SITE_OTHER): Payer: BLUE CROSS/BLUE SHIELD | Admitting: Cardiovascular Disease

## 2016-01-30 ENCOUNTER — Encounter: Payer: Self-pay | Admitting: Cardiovascular Disease

## 2016-01-30 VITALS — BP 140/78 | HR 64 | Ht 62.0 in | Wt 124.5 lb

## 2016-01-30 DIAGNOSIS — E785 Hyperlipidemia, unspecified: Secondary | ICD-10-CM | POA: Diagnosis not present

## 2016-01-30 DIAGNOSIS — R002 Palpitations: Secondary | ICD-10-CM

## 2016-01-30 DIAGNOSIS — I10 Essential (primary) hypertension: Secondary | ICD-10-CM | POA: Diagnosis not present

## 2016-01-30 HISTORY — DX: Essential (primary) hypertension: I10

## 2016-01-30 MED ORDER — METOPROLOL SUCCINATE ER 25 MG PO TB24: 25.0000 mg | ORAL_TABLET | Freq: Every day | ORAL | Status: DC

## 2016-01-30 NOTE — Progress Notes (Signed)
Cardiology Office Note   Date:  01/30/2016   ID:  Michelle Robertson, DOB August 06, 1951, MRN [id number]  PCP:  Hoyt Koch, MD  Cardiologist:   Sharol Harness, MD   Chief Complaint  Patient presents with  . New Patient (Initial Visit)     no chest pain, has shortness of breath, no edema,  occassional pain or cramping in legs, occassional lightheadedness or dizziness, has fatigue      History of Present Illness: Michelle Robertson is a 65 y.o. female with diabetes mellitus type 2 and hyperlipidemia who presents for an evaluation of palpitations.  Michelle Robertson was seen in the ED on 3/11 with palpitations.  The symptoms started in October.  She reports intermittent episodes of nausea and palpitations.  It typically occurs once per week and is associated with shortness of breath, nausea and occasionally dizziness.  The episodes last for several minutes at a time and occur repeatedly throughout the day.  There is no associated chest pain, lower extremity edema, orthopnea or PND.  It typically occurs at rest rather than with exertion.  She also notes fatigue.  Michelle Robertson does not exercise much.  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  She notes that after exerting herself she is extremely fatigued the following day.m  She does not drink caffeine or use OTC medications.   She was initially evaluated by her PCP, who felt the symptoms may be attributable to anxiety.  In the ED EKG revealed sinus rhythm with occasional PVCs.  Labs revealed mild anemia that was stable and no electrolyte abnormalities.  She has been taking zofran which helps.    Michelle Robertson had an echo in 2014 that revealed LVEF 65-70% and was otherwise unremarkable.    Past Medical History  Diagnosis Date  . Diabetes mellitus without complication ([place])   . Ectopic pregnancy   . Essential hypertension 01/30/2016    Past Surgical History  Procedure Laterality Date  .  Tubal ligation       Current Outpatient Prescriptions  Medication Sig Dispense Refill  . Cholecalciferol (VITAMIN D) 2000 units CAPS Take by mouth.    . clonazePAM (KLONOPIN) 0.5 MG tablet Take 1 tablet (0.5 mg total) by mouth 2 (two) times daily as needed. for anxiety 30 tablet 0  . glucose blood (BAYER CONTOUR TEST) test strip 1 each by Other route 3 (three) times daily. Use to check blood sugars three times of day Dx E11.9 100 each 11  . metFORMIN (GLUCOPHAGE) 500 MG tablet Take 1 tablet by mouth 2 (two) times daily. Take 1 tab by mouth twice a day  0  . ondansetron (ZOFRAN) 4 MG tablet TAKE ONE TABLET BY MOUTH EVERY 8 HOURS AS NEEDED FOR NAUSEA OR  VOMITING 20 tablet 0  . Tetrahydrozoline HCl (VISINE OP) Place 2 drops into both eyes as needed (for dry eyes).    . metoprolol succinate (TOPROL XL) 25 MG 24 hr tablet Take 1 tablet (25 mg total) by mouth daily. 90 tablet 1   No current facility-administered medications for this visit.    Allergies:   Review of patient's allergies indicates no known allergies.    Social History:  The patient  reports that she has never smoked. She has never used smokeless tobacco. She reports that she does not drink alcohol or use illicit drugs.   Family History:  The patient's family history includes Cancer in her  father; Diabetes in her maternal aunt, mother, and paternal grandmother; Stroke in her mother.    ROS:  Please see the history of present illness.   Otherwise, review of systems are positive for none.   All other systems are reviewed and negative.    PHYSICAL EXAM: VS:  BP 140/78 mmHg  Pulse 64  Ht 5\' 2"  (1.575 m)  Wt 56.473 kg (124 lb 8 oz)  BMI 22.77 kg/m2 , BMI Body mass index is 22.77 kg/(m^2). GENERAL:  Well appearing HEENT:  Pupils equal round and reactive, fundi not visualized, oral mucosa unremarkable NECK:  No jugular venous distention, waveform within normal limits, carotid upstroke brisk and symmetric, no bruits, no  thyromegaly LYMPHATICS:  No cervical adenopathy LUNGS:  Clear to auscultation bilaterally HEART:  RRR.  PMI not displaced or sustained,S1 and S2 within normal limits, no S3, no S4, no clicks, no rubs, no murmurs ABD:  Flat, positive bowel sounds normal in frequency in pitch, no bruits, no rebound, no guarding, no midline pulsatile mass, no hepatomegaly, no splenomegaly EXT:  2 plus pulses throughout, no edema, no cyanosis no clubbing SKIN:  No rashes no nodules NEURO:  Cranial nerves II through XII grossly intact, motor grossly intact throughout PSYCH:  Cognitively intact, oriented to person place and time    EKG:  EKG is not ordered today. The ekg ordered 3/10 demonstrates sinus rhythm rate 92 bpm.  Once PVC.     Recent Labs: 07/19/2015: TSH 3.49 01/05/2016: ALT 8 01/27/2016: BUN 16; Creatinine, Ser 0.97; Hemoglobin 11.2*; Platelets 338; Potassium 3.8; Sodium 141    Lipid Panel    Component Value Date/Time   CHOL 231* 07/19/2015 1541   TRIG * 07/19/2015 1541    501.0 Triglyceride is over 400; calculations on Lipids are invalid.   HDL 34.90* 07/19/2015 1541   CHOLHDL 7 07/19/2015 1541   VLDL 46.2* 03/15/2015 1017   LDLCALC 163* 09/14/2010 2141   LDLDIRECT 134.0 07/19/2015 1541      Wt Readings from Last 3 Encounters:  01/30/16 56.473 kg (124 lb 8 oz)  01/27/16 56.246 kg (124 lb)  01/05/16 59.603 kg (131 lb 6.4 oz)      ASSESSMENT AND PLAN:  # Palpitations:  PVCs were noted on telemetry while in the hospital.  Given her report that symptoms last for prolonged periods of time, it is possible that she has other arrhythmias.  She is not able to wear an ambulatory monitor because [place].  She has already had the appropriate lab testing which was unremarkable.  We will start a trial of metoprolol succinate 25 mg daily  # Hypertension: Michelle Robertson's BP is at the upper limit of normal today and has been >140/90 on more than one occasion.  We will  start metoprolol as above.  # Hyperlipidemia: Given her diabetes, she should be on a statin.  We will discuss this at her next appointment.   Current medicines are reviewed at length with the patient today.  The patient does not have concerns regarding medicines.  The following changes have been made:  Start metoprolol   Labs/ tests ordered today include:  No orders of the defined types were placed in this encounter.     Disposition:   FU with Kierstin January C. Oval Linsey, MD, [facility] in 6 weeks.   This note was written with the assistance of speech recognition software.  Please excuse any transcriptional errors.  Signed, Joline Encalada C. Oval Linsey, MD,  [facility]  01/30/2016 4:59 PM    [facility]

## 2016-01-30 NOTE — Patient Instructions (Signed)
Medication Instructions:  START METOPROLOL SUCC 25 MG ONE DAILY  Labwork: NONE  Testing/Procedures: NONE  Follow-Up: Your physician wants you to follow-up in: 6 WEEKS You will receive a reminder letter in the mail two months in advance. If you don't receive a letter, please call our office to schedule the follow-up appointment.  If you need a refill on your cardiac medications before your next appointment, please call your pharmacy.

## 2016-03-11 NOTE — Progress Notes (Signed)
Cardiology Office Note   Date:  03/12/2016   ID:  Terre Lesar, DOB 1951-02-22, MRN [id number]  PCP:  Hoyt Koch, MD  Cardiologist:   Skeet Latch, MD   Chief Complaint  Patient presents with  . Follow-up    Occ shortness of breath, occ dizziness when getting up    Patient ID: Michelle Robertson is a 65 y.o. female with diabetes mellitus type 2 and hyperlipidemia who presents for follow up evaluation of palpitations.    Interval History 03/12/16: After her last appointment Michelle Robertson was started on metoprolol 25mg  daily.  She continues to have palpitations intermittently but much less frequently.  It occurs 1-2 times per week and does not persist.  She denies chest pain, shortness of breath, lower extremity edema.  She notes that if she lifts something heavy, she is more likely to have palpitations that day or the following day.  Michelle Robertson report being told that a statin was recommended in the past.  However, she is afraid of the side effects and never started one.   History of Present Illness 01/30/16:Michelle Robertson was seen in the ED on 3/11 with palpitations.  The symptoms started in October.  She reports intermittent episodes of nausea and palpitations.  It typically occurs once per week and is associated with shortness of breath, nausea and occasionally dizziness.  The episodes last for several minutes at a time and occur repeatedly throughout the day.  There is no associated chest pain, lower extremity edema, orthopnea or PND.  It typically occurs at rest rather than with exertion.  She also notes fatigue.  Michelle Robertson does not exercise much.  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  She notes that after exerting herself she is extremely fatigued the following day.m  She does not drink caffeine or use OTC medications.   She was initially evaluated by her PCP, who felt the symptoms may be attributable to anxiety.  In the  ED EKG revealed sinus rhythm with occasional PVCs.  Labs revealed mild anemia that was stable and no electrolyte abnormalities.  She has been taking zofran which helps.    Michelle Robertson had an echo in 2014 that revealed LVEF 65-70% and was otherwise unremarkable.    Past Medical History  Diagnosis Date  . Diabetes mellitus without complication ([place])   . Ectopic pregnancy   . Essential hypertension 01/30/2016    Past Surgical History  Procedure Laterality Date  . Tubal ligation       Current Outpatient Prescriptions  Medication Sig Dispense Refill  . Cholecalciferol (VITAMIN D) 2000 units CAPS Take by mouth.    . clonazePAM (KLONOPIN) 0.5 MG tablet Take 1 tablet (0.5 mg total) by mouth 2 (two) times daily as needed. for anxiety 30 tablet 0  . glucose blood (BAYER CONTOUR TEST) test strip 1 each by Other route 3 (three) times daily. Use to check blood sugars three times of day Dx E11.9 100 each 11  . metFORMIN (GLUCOPHAGE) 500 MG tablet Take 1 tablet by mouth 2 (two) times daily. Take 1 tab by mouth twice a day  0  . metoprolol succinate (TOPROL XL) 25 MG 24 hr tablet Take 1 tablet (25 mg total) by mouth daily. 90 tablet 1  . Tetrahydrozoline HCl (VISINE OP) Place 2 drops into both eyes as needed (for dry eyes).     No current facility-administered medications for this visit.  Allergies:   Review of patient's allergies indicates no known allergies.    Social History:  The patient  reports that she has never smoked. She has never used smokeless tobacco. She reports that she does not drink alcohol or use illicit drugs.   Family History:  The patient's family history includes Cancer in her father; Diabetes in her maternal aunt, mother, and paternal grandmother; Stroke in her mother.    ROS:  Please see the history of present illness.   Otherwise, review of systems are positive for none.   All other systems are reviewed and negative.    PHYSICAL EXAM: VS:  BP 118/78 mmHg  Pulse  58  Ht 5\' 2"  (1.575 m)  Wt 58.514 kg (129 lb)  BMI 23.59 kg/m2 , BMI Body mass index is 23.59 kg/(m^2). GENERAL:  Well appearing HEENT:  Pupils equal round and reactive, fundi not visualized, oral mucosa unremarkable NECK:  No jugular venous distention, waveform within normal limits, carotid upstroke brisk and symmetric, no bruits, no thyromegaly LYMPHATICS:  No cervical adenopathy LUNGS:  Clear to auscultation bilaterally HEART:  RRR.  PMI not displaced or sustained,S1 and S2 within normal limits, no S3, no S4, no clicks, no rubs, no murmurs ABD:  Flat, positive bowel sounds normal in frequency in pitch, no bruits, no rebound, no guarding, no midline pulsatile mass, no hepatomegaly, no splenomegaly EXT:  2 plus pulses throughout, no edema, no cyanosis no clubbing SKIN:  No rashes no nodules NEURO:  Cranial nerves II through XII grossly intact, motor grossly intact throughout PSYCH:  Cognitively intact, oriented to person place and time   EKG:  EKG is ordered today. Sinus bradycardia.  Rate 59 bpm.     Recent Labs: 07/19/2015: TSH 3.49 01/05/2016: ALT 8 01/27/2016: BUN 16; Creatinine, Ser 0.97; Hemoglobin 11.2*; Platelets 338; Potassium 3.8; Sodium 141    Lipid Panel    Component Value Date/Time   CHOL 231* 07/19/2015 1541   TRIG * 07/19/2015 1541    501.0 Triglyceride is over 400; calculations on Lipids are invalid.   HDL 34.90* 07/19/2015 1541   CHOLHDL 7 07/19/2015 1541   VLDL 46.2* 03/15/2015 1017   LDLCALC 163* 09/14/2010 2141   LDLDIRECT 134.0 07/19/2015 1541      Wt Readings from Last 3 Encounters:  03/12/16 58.514 kg (129 lb)  01/30/16 56.473 kg (124 lb 8 oz)  01/27/16 56.246 kg (124 lb)      ASSESSMENT AND PLAN:  # Palpitations:  PVCs were noted on telemetry while in the hospital.  Her symptoms have improved on metoprolol.  At the last appointment we discussed wearing an ambulatory monitor.  She wishes to pursue that at this time to have a definitive diagnosis.   We will order a 7 day Event monitor.  Continue  metoprolol succinate 25 mg daily  # Hypertension: BP is well-controlled on metoprolol.    # Hyperlipidemia: Michelle Robertson has been reluctant to use a statin in the past.  We discussed the importance and she is willing to try.  We will repeat her lipids prior to starting a dose.  We will also check a CMP.   Current medicines are reviewed at length with the patient today.  The patient does not have concerns regarding medicines.  The following changes have been made:  Start metoprolol   Labs/ tests ordered today include:   Orders Placed This Encounter  Procedures  . Lipid Profile  . Comprehensive Metabolic Panel (CMET)  . Cardiac event monitor  .  EKG 12-Lead     Disposition:   FU with Callum Wolf C. Oval Linsey, MD, [facility] in 3 months.    This note was written with the assistance of speech recognition software.  Please excuse any transcriptional errors.  Signed, Cartel Mauss C. Oval Linsey, MD, [facility]  03/12/2016 2:08 PM    [facility]

## 2016-03-12 ENCOUNTER — Encounter: Payer: Self-pay | Admitting: Cardiovascular Disease

## 2016-03-12 ENCOUNTER — Ambulatory Visit (INDEPENDENT_AMBULATORY_CARE_PROVIDER_SITE_OTHER): Payer: BLUE CROSS/BLUE SHIELD | Admitting: Cardiovascular Disease

## 2016-03-12 ENCOUNTER — Encounter (INDEPENDENT_AMBULATORY_CARE_PROVIDER_SITE_OTHER): Payer: BLUE CROSS/BLUE SHIELD

## 2016-03-12 VITALS — BP 118/78 | HR 58 | Ht 62.0 in | Wt 129.0 lb

## 2016-03-12 DIAGNOSIS — E785 Hyperlipidemia, unspecified: Secondary | ICD-10-CM

## 2016-03-12 DIAGNOSIS — I10 Essential (primary) hypertension: Secondary | ICD-10-CM | POA: Diagnosis not present

## 2016-03-12 DIAGNOSIS — R002 Palpitations: Secondary | ICD-10-CM | POA: Diagnosis not present

## 2016-03-12 NOTE — Patient Instructions (Addendum)
Medication Instructions:  Your physician recommends that you continue on your current medications as directed. Please refer to the Current Medication list given to you today.  Labwork: Fasting Lp/cmet at [facility] lab on first floor soon   Testing/Procedures: Your physician has recommended that you wear an event monitor. Event monitors are medical devices that record the heart's electrical activity. Doctors most often [place] these monitors to diagnose arrhythmias. Arrhythmias are problems with the speed or rhythm of the heartbeat. The monitor is a small, portable device. You can wear one while you do your normal daily activities. This is usually used to diagnose what is causing palpitations/syncope (passing out). 7 day   Follow-Up: Your physician recommends that you schedule a follow-up appointment in: 3 month   If you need a refill on your cardiac medications before your next appointment, please call your pharmacy.

## 2016-03-18 DIAGNOSIS — R002 Palpitations: Secondary | ICD-10-CM

## 2016-03-22 LAB — COMPREHENSIVE METABOLIC PANEL
ALBUMIN: 4.5 g/dL (ref 3.6–5.1)
ALT: 12 U/L (ref 6–29)
AST: 17 U/L (ref 10–35)
Alkaline Phosphatase: 54 U/L (ref 33–130)
BUN: 16 mg/dL (ref 7–25)
CHLORIDE: 107 mmol/L (ref 98–110)
CO2: 25 mmol/L (ref 20–31)
CREATININE: 0.96 mg/dL (ref 0.50–0.99)
Calcium: 9.4 mg/dL (ref 8.6–10.4)
Glucose, Bld: 126 mg/dL — ABNORMAL HIGH (ref 65–99)
Potassium: 4.4 mmol/L (ref 3.5–5.3)
SODIUM: 139 mmol/L (ref 135–146)
Total Bilirubin: 0.5 mg/dL (ref 0.2–1.2)
Total Protein: 7.1 g/dL (ref 6.1–8.1)

## 2016-03-22 LAB — LIPID PANEL
CHOL/HDL RATIO: 5.2 ratio — AB (ref <=5.0)
Cholesterol: 258 mg/dL — ABNORMAL HIGH (ref 125–200)
HDL: 50 mg/dL (ref >=46)
LDL CALC: 172 mg/dL — AB (ref <130)
Triglycerides: 179 mg/dL — ABNORMAL HIGH (ref <150)
VLDL: 36 mg/dL — ABNORMAL HIGH (ref <30)

## 2016-03-28 ENCOUNTER — Telehealth: Payer: Self-pay | Admitting: *Deleted

## 2016-03-28 DIAGNOSIS — E785 Hyperlipidemia, unspecified: Secondary | ICD-10-CM

## 2016-03-28 MED ORDER — ROSUVASTATIN CALCIUM 20 MG PO TABS: 20.0000 mg | ORAL_TABLET | Freq: Every day | ORAL | Status: DC

## 2016-03-28 NOTE — Telephone Encounter (Deleted)
-----   Message from Skeet Latch, MD sent at 03/26/2016  9:11 AM EDT ----- Cholesterol levels are still very high.  Recommend starting rosuvastatin 20 mg daily.  Repeat lipids and LFTs in 6 weeks.

## 2016-03-28 NOTE — Telephone Encounter (Signed)
Advised patient of labs and event monitor results Sent Rx for Crestor to pharmacy and mailed lab orders

## 2016-03-28 NOTE — Telephone Encounter (Signed)
-----   Message from Skeet Latch, MD sent at 03/26/2016  9:11 AM EDT ----- Cholesterol levels are still very high.  Recommend starting rosuvastatin 20 mg daily.  Repeat lipids and LFTs in 6 weeks.

## 2016-03-28 NOTE — Telephone Encounter (Signed)
-----   Message from Skeet Latch, MD sent at 03/26/2016 12:50 PM EDT ----- Monitor shows occasional early beats from the top chamber of the heart.  These are not dangerous.  Continue metoprolol.

## 2016-05-09 ENCOUNTER — Ambulatory Visit: Payer: Self-pay | Admitting: Internal Medicine

## 2016-06-04 ENCOUNTER — Other Ambulatory Visit: Payer: Self-pay | Admitting: Internal Medicine

## 2016-06-13 ENCOUNTER — Ambulatory Visit (INDEPENDENT_AMBULATORY_CARE_PROVIDER_SITE_OTHER): Payer: BLUE CROSS/BLUE SHIELD | Admitting: Internal Medicine

## 2016-06-13 ENCOUNTER — Ambulatory Visit: Payer: BLUE CROSS/BLUE SHIELD | Admitting: Cardiovascular Disease

## 2016-06-13 ENCOUNTER — Encounter: Payer: Self-pay | Admitting: Internal Medicine

## 2016-06-13 VITALS — BP 132/72 | HR 55 | Temp 98.0°F | Ht 62.0 in | Wt 126.6 lb

## 2016-06-13 DIAGNOSIS — Z1211 Encounter for screening for malignant neoplasm of colon: Secondary | ICD-10-CM

## 2016-06-13 DIAGNOSIS — M21619 Bunion of unspecified foot: Secondary | ICD-10-CM

## 2016-06-13 DIAGNOSIS — I10 Essential (primary) hypertension: Secondary | ICD-10-CM | POA: Diagnosis not present

## 2016-06-13 DIAGNOSIS — D229 Melanocytic nevi, unspecified: Secondary | ICD-10-CM

## 2016-06-13 DIAGNOSIS — K089 Disorder of teeth and supporting structures, unspecified: Secondary | ICD-10-CM

## 2016-06-13 DIAGNOSIS — F411 Generalized anxiety disorder: Secondary | ICD-10-CM

## 2016-06-13 DIAGNOSIS — E119 Type 2 diabetes mellitus without complications: Secondary | ICD-10-CM

## 2016-06-13 DIAGNOSIS — Z7189 Other specified counseling: Secondary | ICD-10-CM | POA: Diagnosis not present

## 2016-06-13 LAB — HEMOGLOBIN A1C
Hgb A1c MFr Bld: 7.1 % — ABNORMAL HIGH (ref <5.7)
MEAN PLASMA GLUCOSE: 157 mg/dL

## 2016-06-13 LAB — BASIC METABOLIC PANEL
BUN: 16 mg/dL (ref 7–25)
CHLORIDE: 108 mmol/L (ref 98–110)
CO2: 24 mmol/L (ref 20–31)
Calcium: 9.4 mg/dL (ref 8.6–10.4)
Creat: 1.13 mg/dL — ABNORMAL HIGH (ref 0.50–0.99)
Glucose, Bld: 107 mg/dL — ABNORMAL HIGH (ref 65–99)
POTASSIUM: 4.9 mmol/L (ref 3.5–5.3)
Sodium: 141 mmol/L (ref 135–146)

## 2016-06-13 MED ORDER — CLONAZEPAM 0.5 MG PO TABS: 0.5000 mg | ORAL_TABLET | Freq: Two times a day (BID) | ORAL | 0 refills | Status: DC | PRN

## 2016-06-13 NOTE — Progress Notes (Signed)
Patient ID: Michelle Robertson, female   DOB: 07-Oct-1951, 65 y.o.   MRN: [id number]    Location:  [facility] Place of Service: OFFICE    Advanced Directive information Does patient have an advance directive?: No, Would patient like information on creating an advanced directive?: Yes - Educational materials given  Chief Complaint  Patient presents with  . Establish Care    New patient to establish care  . Advanced Directive    Discuss advance directive  . Other    Diabetic foot exam    HPI:  65 yo female seen today as a new pt. She has a frail spouse who is on hospice at [facility]. She has noticed increased anhedonia and depressed mood. She declines tx, sx's >5 yrs. (+) fatigue and increased stress with palpitations. She has not had clonazepam in several mos. Overall she feel sx's are better controlled  She has a painful mole on left posterior forearm. Mole has been present since birth but over the yrs it has gotten painful whenever hit against another object. No redness or bleeding. No ulceration.  She has painful R>L bunion x 2 yrs. She has never seen a foot specialist  She has 2 lower teeth that needs to be pulled and has taken abx x 2 which helped. She is followed by dentist  She has increased urinary frequency with 6-10 x nocturia. No leakage during the day. Sx's occur at night. Sx's interrupt sleep.   HTN/palpitations - BP/HR stable on metoprolol. Occasional palpitations but much improved on BB. Followed by cardio Dr Oval Linsey  GAD - has taken klonopin in past. Declines routine med  Hyperlipidemia - takes crestor since May 2017. Followed by cardio. She has not had repeat labs yet to determine efficacy  BPPV - intermittent sx's. Takes OTC motion sickness med which helps  DM - she takes metformin. BS 80s most days but has been fluctuating the last week. Last A1c 6.9%   Past Medical History:  Diagnosis Date  . Diabetes mellitus without complication ([place])   . Ectopic pregnancy   .  Essential hypertension 01/30/2016    Past Surgical History:  Procedure Laterality Date  . TUBAL LIGATION      Patient Care Team: Hoyt Koch, MD as PCP - General (Internal Medicine)  Social History   Social History  . Marital status: Married    Spouse name: N/A  . Number of children: N/A  . Years of education: N/A   Occupational History  . Not on file.   Social History Main Topics  . Smoking status: Never Smoker  . Smokeless tobacco: Never Used  . Alcohol use No  . Drug use: No  . Sexual activity: Not on file   Other Topics Concern  . Not on file   Social History Narrative  . No narrative on file     reports that she has never smoked. She has never used smokeless tobacco. She reports that she does not drink alcohol or use drugs.  Family History  Problem Relation Age of Onset  . Diabetes Mother   . Stroke Mother   . Diabetes Maternal Aunt   . Diabetes Paternal Grandmother   . Cancer Father    Family Status  Relation Status  . Mother   . Maternal Aunt   . Paternal Grandmother   . Father     Immunization History  Administered Date(s) Administered  . Td 11/19/1998, 07/05/2010    No Known Allergies  Medications: Patient's Medications  New Prescriptions   No medications on file  Previous Medications   CHOLECALCIFEROL (VITAMIN D) 2000 UNITS CAPS    Take by mouth.   GLUCOSE BLOOD (BAYER CONTOUR TEST) TEST STRIP    1 each by Other route 3 (three) times daily. Use to check blood sugars three times of day Dx E11.9   METFORMIN (GLUCOPHAGE) 500 MG TABLET    TAKE 1 TABLET BY MOUTH TWICE DAILY WITH A MEAL   METOPROLOL SUCCINATE (TOPROL XL) 25 MG 24 HR TABLET    Take 1 tablet (25 mg total) by mouth daily.   ROSUVASTATIN (CRESTOR) 20 MG TABLET    Take 1 tablet (20 mg total) by mouth daily.   TETRAHYDROZOLINE HCL (VISINE OP)    Place 2 drops into both eyes as needed (for dry eyes).  Modified Medications   Modified Medication Previous Medication    CLONAZEPAM (KLONOPIN) 0.5 MG TABLET clonazePAM (KLONOPIN) 0.5 MG tablet      Take 1 tablet (0.5 mg total) by mouth 2 (two) times daily as needed. for anxiety    Take 1 tablet (0.5 mg total) by mouth 2 (two) times daily as needed. for anxiety  Discontinued Medications   No medications on file    Review of Systems  Constitutional: Positive for fatigue.  HENT: Positive for tinnitus.        Bleeding/painful gums  Eyes:       Dry eyes  Cardiovascular: Positive for palpitations.  Gastrointestinal: Positive for nausea.  Genitourinary: Positive for frequency.  Musculoskeletal:       Bunion  Skin:       Painful mole left wrist; nail abnormality  Psychiatric/Behavioral: The patient is nervous/anxious.   All other systems reviewed and are negative.   Vitals:   06/13/16 0943  BP: 132/72  Pulse: (!) 55  Temp: 98 F (36.7 C)  TempSrc: Oral  SpO2: 98%  Weight: 126 lb 9.6 oz (57.4 kg)  Height: 5\' 2"  (1.575 m)   Body mass index is 23.16 kg/m.  Physical Exam  Constitutional: She is oriented to person, place, and time. She appears well-developed and well-nourished.  HENT:  Mouth/Throat: Oropharynx is clear and moist. No oropharyngeal exudate.  Right TM dull with air fluid level. Left TM nml appearing. Lower gums with L>R incisor poor dentition with swollen TTP gums. Upper dentures and partial lower  Eyes: Pupils are equal, round, and reactive to light. No scleral icterus.  Neck: Neck supple. Carotid bruit is not present. No tracheal deviation present. No thyromegaly present.  Cardiovascular: Normal rate, regular rhythm and intact distal pulses.  Exam reveals no gallop and no friction rub.   Murmur (1/6 SEM) heard. No LE edema b/l. no calf TTP.   Pulmonary/Chest: Effort normal and breath sounds normal. No stridor. No respiratory distress. She has no wheezes. She has no rales.  Abdominal: Soft. Bowel sounds are normal. She exhibits no distension and no mass. There is no hepatomegaly. There  is no tenderness. There is no rebound and no guarding.  Lymphadenopathy:    She has no cervical adenopathy.  Neurological: She is alert and oriented to person, place, and time. She has normal reflexes.  Skin: Skin is warm and dry. No rash noted.     Psychiatric: She has a normal mood and affect. Her behavior is normal. Judgment and thought content normal.   Diabetic Foot Exam - Simple   Simple Foot Form Diabetic Foot exam was performed with the following findings:  Yes 06/13/2016  9:39  AM  Visual Inspection See comments:  Yes Sensation Testing Intact to touch and monofilament testing bilaterally:  Yes Pulse Check Posterior Tibialis and Dorsalis pulse intact bilaterally:  Yes Comments R>L TTP bunion. No ulcerations or calluses      Labs reviewed: No visits with results within 3 Month(s) from this visit.  Latest known visit with results is:  Office Visit on 03/12/2016  Component Date Value Ref Range Status  . Cholesterol 03/22/2016 258* 125 - 200 mg/dL Final  . Triglycerides 03/22/2016 179* <150 mg/dL Final  . HDL 03/22/2016 50  >=46 mg/dL Final  . Total CHOL/HDL Ratio 03/22/2016 5.2* <=5.0 Ratio Final  . VLDL 03/22/2016 36* <30 mg/dL Final  . LDL Cholesterol 03/22/2016 172* <130 mg/dL Final   Comment:   Total Cholesterol/HDL Ratio:CHD Risk                        Coronary Heart Disease Risk Table                                        Men       Women          1/2 Average Risk              3.4        3.3              Average Risk              5.0        4.4           2X Average Risk              9.6        7.1           3X Average Risk             23.4       11.0 Use the calculated Patient Ratio above and the CHD Risk table  to determine the patient's CHD Risk.   . Sodium 03/22/2016 139  135 - 146 mmol/L Final  . Potassium 03/22/2016 4.4  3.5 - 5.3 mmol/L Final  . Chloride 03/22/2016 107  98 - 110 mmol/L Final  . CO2 03/22/2016 25  20 - 31 mmol/L Final  . Glucose, Bld  03/22/2016 126* 65 - 99 mg/dL Final  . BUN 03/22/2016 16  7 - 25 mg/dL Final  . Creat 03/22/2016 0.96  0.50 - 0.99 mg/dL Final  . Total Bilirubin 03/22/2016 0.5  0.2 - 1.2 mg/dL Final  . Alkaline Phosphatase 03/22/2016 54  33 - 130 U/L Final  . AST 03/22/2016 17  10 - 35 U/L Final  . ALT 03/22/2016 12  6 - 29 U/L Final  . Total Protein 03/22/2016 7.1  6.1 - 8.1 g/dL Final  . Albumin 03/22/2016 4.5  3.6 - 5.1 g/dL Final  . Calcium 03/22/2016 9.4  8.6 - 10.4 mg/dL Final    No results found.   Assessment/Plan   ICD-9-CM ICD-10-CM   1. Controlled type 2 diabetes mellitus without complication, without long-term current use of insulin (HCC) 250.00 E11.9 Ambulatory referral to Ophthalmology     Basic Metabolic Panel     Hemoglobin A1c     Microalbumin/Creatinine Ratio, Urine  2. BUNIONS, BILATERAL 727.1 M21.619 Ambulatory referral to Podiatry  3. GAD (generalized anxiety disorder)  300.02 F41.1 clonazePAM (KLONOPIN) 0.5 MG tablet  4. Essential hypertension 401.9 99991111 Basic Metabolic Panel  5. Nevus 216.9 D22.9 Ambulatory referral to Dermatology   irritated left forearm  6. Colon cancer screening V76.51 Z12.11 Ambulatory referral to Gastroenterology  7. Counseling regarding advanced directives V65.49 Z71.89   8. Poor dentition 525.9 K08.9      Discussed advanced directives/living wills including HCPOA. She will discuss options further with her family and return the form after getting it notarized  Will call with referrals to dermatology/podiatry  Will call with lab results  F/u with dentist as scheduled  Continue current medications as ordered  She declined maintenance GAD med  She declined mammogram at this time  Follow up in 2- 3 mos for CPE/MMSE.   Alpha Mysliwiec S. Perlie Gold  [facility] [address] [place], [place] [address] [phone] Cell (Monday-Friday 8 AM - 5 PM) [phone] After 5 PM and follow prompts

## 2016-06-13 NOTE — Patient Instructions (Signed)
Will call with referrals to dermatology podiatry  Will call with lab results  Continue current medications as ordered  Follow up in 2- 3 mos for CPE/MMSE.

## 2016-06-14 LAB — MICROALBUMIN / CREATININE URINE RATIO
CREATININE, URINE: 178 mg/dL (ref 20–320)
MICROALB/CREAT RATIO: 5 ug/mg{creat} (ref <30)
Microalb, Ur: 0.9 mg/dL

## 2016-06-19 ENCOUNTER — Encounter: Payer: Self-pay | Admitting: Internal Medicine

## 2016-07-10 ENCOUNTER — Ambulatory Visit: Payer: BLUE CROSS/BLUE SHIELD | Admitting: Cardiovascular Disease

## 2016-07-24 ENCOUNTER — Ambulatory Visit (INDEPENDENT_AMBULATORY_CARE_PROVIDER_SITE_OTHER): Payer: BLUE CROSS/BLUE SHIELD | Admitting: Sports Medicine

## 2016-07-24 ENCOUNTER — Encounter: Payer: Self-pay | Admitting: Sports Medicine

## 2016-07-24 ENCOUNTER — Ambulatory Visit (INDEPENDENT_AMBULATORY_CARE_PROVIDER_SITE_OTHER): Payer: BLUE CROSS/BLUE SHIELD

## 2016-07-24 DIAGNOSIS — M2020 Hallux rigidus, unspecified foot: Secondary | ICD-10-CM

## 2016-07-24 DIAGNOSIS — E119 Type 2 diabetes mellitus without complications: Secondary | ICD-10-CM | POA: Diagnosis not present

## 2016-07-24 DIAGNOSIS — M79673 Pain in unspecified foot: Secondary | ICD-10-CM | POA: Diagnosis not present

## 2016-07-24 NOTE — Progress Notes (Signed)
Subjective: Michelle Robertson is a 65 y.o. female patient who presents to office for evaluation of Right> Left bunion pain. Patient complains of progressive pain especially over the last 3 years in the Right>Left foot that starts as pain over the bump with direct pressure and range of motion; patient now has difficulty fitting shoes comfortably. States that she is unable to wear heels and wants a more premananent solution to fix this.  Patient has also tried change in shoes, creams, and rubs with no releif. Patient denies any other pedal complaints.   Patient Active Problem List   Diagnosis Date Noted  . Essential hypertension 01/30/2016  . Nausea without vomiting 09/14/2015  . GAD (generalized anxiety disorder) 09/14/2015  . Left knee pain 07/20/2015  . BPPV (benign paroxysmal positional vertigo) 03/18/2015  . Diabetes mellitus type 2, controlled ([place]) 09/03/2013  . Palpitations 09/02/2013  . Hyperlipidemia 07/06/2010  . BUNIONS, BILATERAL 04/06/2010    Current Outpatient Prescriptions on File Prior to Visit  Medication Sig Dispense Refill  . Cholecalciferol (VITAMIN D) 2000 units CAPS Take by mouth.    . clonazePAM (KLONOPIN) 0.5 MG tablet Take 1 tablet (0.5 mg total) by mouth 2 (two) times daily as needed. for anxiety 60 tablet 0  . glucose blood (BAYER CONTOUR TEST) test strip 1 each by Other route 3 (three) times daily. Use to check blood sugars three times of day Dx E11.9 100 each 11  . metFORMIN (GLUCOPHAGE) 500 MG tablet TAKE 1 TABLET BY MOUTH TWICE DAILY WITH A MEAL 180 tablet 0  . metoprolol succinate (TOPROL XL) 25 MG 24 hr tablet Take 1 tablet (25 mg total) by mouth daily. 90 tablet 1  . rosuvastatin (CRESTOR) 20 MG tablet Take 1 tablet (20 mg total) by mouth daily. 30 tablet 5  . Tetrahydrozoline HCl (VISINE OP) Place 2 drops into both eyes as needed (for dry eyes).     No current facility-administered medications on file prior to visit.     No Known Allergies  Objective:   General: Alert and oriented x3 in no acute distress  Dermatology: No open lesions bilateral lower extremities, no webspace macerations, no ecchymosis bilateral, all nails x 10 are well manicured.  Vascular: Dorsalis Pedis and Posterior Tibial pedal pulses 1/4, Capillary Fill Time 3 seconds, (+) scant pedal hair growth bilateral, no edema bilateral lower extremities, Temperature gradient within normal limits.  Neurology: Johney Maine sensation intact via light touch bilateral, Protective sensation intact with Semmes Weinstein Monofilament to all pedal sites, Position sense intact, vibratory intact bilateral, Deep tendon reflexes within normal limits bilateral, No babinski sign present bilateral. (-) Tinels sign bilateral.   Musculoskeletal: Mild tenderness with palpation right>left dorsal bunion deformity, + limitation with range of motion, deformity non-reducible. Midtarsal, Subtalar joint, and ankle joint range of motion is within normal limits. On weightbearing exam, there is decreased 1st MTPJ rom Right>Left with functional rigidus noted.   Xrays  Right and Left Foot    Impression: Significant 1st MTPJ osteoarthritis with joint fracture and narrowing R>L, calcaneal spur, soft tissues within normal limits. No other acute findings.       Assessment and Plan: Problem List Items Addressed This Visit    None    Visit Diagnoses    Foot pain, unspecified laterality    -  Primary   Relevant Orders   DG Foot 2 Views Left   DG Foot 2 Views Right   Hallux rigidus, unspecified laterality       R>L  Diabetes mellitus without complication ([place])           -Complete examination performed -Xrays reviewed -Discussed treatement options; discussed Hallux rigidus deformity;conservative and  Surgical management; risks, benefits, alternatives discussed. All patient's questions answered. -Conservative care offered but patient elects for surgery -Patient opt for surgical management. Consent obtained for  Right [place] bunionectomy with screw and stravix allograft. Pre and Post op course explained. Risks, benefits, alternatives explained. No guarantees given or implied. Surgical booking slip submitted and provided patient with Surgical packet and info for [facility]. -Patient will call back for date; would like to talk with her children prior  -Dispensed CAM Walker to use post op -Patient will require medical clearance since Diabetic  -Recommend continue with good supportive shoes and inserts meanwhile and topical rubs as needed  -Patient to return to office after surgery or sooner if condition worsens.  Landis Martins, DPM

## 2016-07-24 NOTE — Patient Instructions (Addendum)
Hallux Rigidus Hallux rigidus is a condition involving pain and a loss of motion of the first (big) toe. The pain gets worse with lifting up (extension) of the toe. This is usually due to arthritic bony bumps (spurring) of the joint at the base of the big toe.  SYMPTOMS   Pain, with lifting up of the toe.  Tenderness over the joint where the big toe meets the foot.  Redness, swelling, and warmth over the top of the base of the big toe (sometimes).  Foot pain, stiffness, and limping. CAUSES  Hallux rigidus is caused by arthritis of the joint where the big toe meets the foot. The arthritis creates a bone spur that pinches the soft tissues when the toe is extended. RISK INCREASES WITH:  Tight shoes with a narrow toe box.  Family history of foot problems.  Gout and rheumatoid and psoriatic arthritis.  History of previous toe injury, including "turf toe."  Long first toe, flat feet, and other big toe bony bumps.  Arthritis of the big toe. PREVENTION   Wear wide-toed shoes that fit well.  Tape the big toe to reduce motion and to prevent pinching of the tissues between the bone.  Maintain physical fitness:  Foot and ankle flexibility.  Muscle strength and endurance. PROGNOSIS  This condition can usually be managed with proper treatment. However, surgery is typically required to prevent the problem from recurring.  RELATED COMPLICATIONS  Injury to other areas of the foot or ankle, caused by abnormal walking in an attempt to avoid the pain felt when walking normally. TREATMENT Treatment first involves stopping the activities that aggravate your symptoms. Ice and medicine can be used to reduce the pain and inflammation. Modifications to shoes may help reduce pain, including wearing stiff-soled shoes, shoes with a wide toe box, inserting a padded donut to relieve pressure on top of the joint, or wearing an arch support. Corticosteroid injections may be given to reduce inflammation. If  nonsurgical treatment is unsuccessful, surgery may be needed. Surgical options include removing the arthritic bony spur, cutting a bone in the foot to change the arc of motion (allowing the toe to extend more), or fusion of the joint (eliminating all motion in the joint at the base of the big toe).  MEDICATION   If pain medicine is needed, nonsteroidal anti-inflammatory medicines (aspirin and ibuprofen), or other minor pain relievers (acetaminophen), are often advised.  Do not take pain medicine for 7 days before surgery.  Prescription pain relievers are usually prescribed only after surgery. Use only as directed and only as much as you need.  Ointments for arthritis, applied to the skin, may give some relief.  Injections of corticosteroids may be given to reduce inflammation. HEAT AND COLD  Cold treatment (icing) relieves pain and reduces inflammation. Cold treatment should be applied for 10 to 15 minutes every 2 to 3 hours, and immediately after activity that aggravates your symptoms. Use ice packs or an ice massage.  Heat treatment may be used before performing the stretching and strengthening activities prescribed by your caregiver, physical therapist, or athletic trainer. Use a heat pack or a warm water soak. SEEK MEDICAL CARE IF:   Symptoms get worse or do not improve in 2 weeks, despite treatment.  After surgery you develop fever, increasing pain, redness, swelling, drainage of fluids, bleeding, or increasing warmth.  New, unexplained symptoms develop. (Drugs used in treatment may produce side effects.)   This information is not intended to replace advice given to   you by your health care provider. Make sure you discuss any questions you have with your health care provider.   Document Released: 11/05/2005 Document Revised: 11/26/2014 Document Reviewed: 02/17/2009  Pre-Operative Instructions  Congratulations, you have decided to take an important step to improving your quality of  life.  You can be assured that the doctors of [facility] will be with you every step of the way.  1. Plan to be at the surgery center/hospital at least 1 (one) hour prior to your scheduled time unless otherwise directed by the surgical center/hospital staff.  You must have a responsible adult accompany you, remain during the surgery and drive you home.  Make sure you have directions to the surgical center/hospital and know how to get there on time. 2. For hospital based surgery you will need to obtain a history and physical form from your family physician within 1 month prior to the date of surgery- we will give you a form for you primary physician.  3. We make every effort to accommodate the date you request for surgery.  There are however, times where surgery dates or times have to be moved.  We will contact you as soon as possible if a change in schedule is required.   4. No Aspirin/Ibuprofen for one week before surgery.  If you are on aspirin, any non-steroidal anti-inflammatory medications (Mobic, Aleve, Ibuprofen) you should stop taking it 7 days prior to your surgery.  You make take Tylenol  For pain prior to surgery.  5. Medications- If you are taking daily heart and blood pressure medications, seizure, reflux, allergy, asthma, anxiety, pain or diabetes medications, make sure the surgery center/hospital is aware before the day of surgery so they may notify you which medications to take or avoid the day of surgery. 6. No food or drink after midnight the night before surgery unless directed otherwise by surgical center/hospital staff. 7. No alcoholic beverages 24 hours prior to surgery.  No smoking 24 hours prior to or 24 hours after surgery. 8. Wear loose pants or shorts- loose enough to fit over bandages, boots, and casts. 9. No slip on shoes, sneakers are best. 10. Bring your boot with you to the surgery center/hospital.  Also bring crutches or a walker if your physician has prescribed it  for you.  If you do not have this equipment, it will be provided for you after surgery. 11. If you have not been contracted by the surgery center/hospital by the day before your surgery, call to confirm the date and time of your surgery. 12. Leave-time from work may vary depending on the type of surgery you have.  Appropriate arrangements should be made prior to surgery with your employer. 13. Prescriptions will be provided immediately following surgery by your doctor.  Have these filled as soon as possible after surgery and take the medication as directed. 14. Remove nail polish on the operative foot. 15. Wash the night before surgery.  The night before surgery wash the foot and leg well with the antibacterial soap provided and water paying special attention to beneath the toenails and in between the toes.  Rinse thoroughly with water and dry well with a towel.  Perform this wash unless told not to do so by your physician.  Enclosed: 1 Ice pack (please put in freezer the night before surgery)   1 Hibiclens skin cleaner   Pre-op Instructions  If you have any questions regarding the instructions, do not hesitate to call our office.  [place]: [address], [place] [address] [place]: [address] [place], [place] [address] [phone]  [place]: [address], [place] [address] [phone]   Dr. Ila Mcgill DPM, Dr. Celesta Gentile DPM, Dr. Lanelle Bal DPM, Dr. Landis Martins DPM [employer] Patient Education 2016 [place].

## 2016-07-26 ENCOUNTER — Encounter: Payer: Self-pay | Admitting: Cardiovascular Disease

## 2016-07-26 ENCOUNTER — Ambulatory Visit (INDEPENDENT_AMBULATORY_CARE_PROVIDER_SITE_OTHER): Payer: BLUE CROSS/BLUE SHIELD | Admitting: Cardiovascular Disease

## 2016-07-26 VITALS — BP 169/87 | HR 68 | Ht 62.5 in | Wt 129.4 lb

## 2016-07-26 DIAGNOSIS — I10 Essential (primary) hypertension: Secondary | ICD-10-CM | POA: Diagnosis not present

## 2016-07-26 DIAGNOSIS — E785 Hyperlipidemia, unspecified: Secondary | ICD-10-CM | POA: Diagnosis not present

## 2016-07-26 DIAGNOSIS — I491 Atrial premature depolarization: Secondary | ICD-10-CM

## 2016-07-26 MED ORDER — METOPROLOL SUCCINATE ER 50 MG PO TB24: 50.0000 mg | ORAL_TABLET | Freq: Every day | ORAL | 1 refills | Status: DC
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
## 2016-07-26 NOTE — Patient Instructions (Addendum)
Medication Instructions:  INCREASE YOUR METOPROLOL TO 50 MG DAILY   Labwork: FASTING LP/CMET AT [facility]  Testing/Procedures: NONE  Follow-Up: Your physician wants you to follow-up in: [place] will receive a reminder letter in the mail two months in advance. If you don't receive a letter, please call our office to schedule the follow-up appointment.  If you need a refill on your cardiac medications before your next appointment, please call your pharmacy.

## 2016-07-28 ENCOUNTER — Encounter: Payer: Self-pay | Admitting: Cardiovascular Disease

## 2016-07-28 DIAGNOSIS — I491 Atrial premature depolarization: Secondary | ICD-10-CM

## 2016-07-28 HISTORY — DX: Atrial premature depolarization: I49.1

## 2016-08-14 ENCOUNTER — Ambulatory Visit (AMBULATORY_SURGERY_CENTER): Payer: Self-pay

## 2016-08-14 VITALS — Ht 62.5 in | Wt 129.6 lb

## 2016-08-14 DIAGNOSIS — Z1211 Encounter for screening for malignant neoplasm of colon: Secondary | ICD-10-CM

## 2016-08-14 NOTE — Progress Notes (Signed)
Pt came into the office today for her PV prior to her colon on 08/21/16. Pt states she has had SOB with exertion, palpitations and nausea over the last few weeks, with  history of PAC's and PVC's. She has had a lot stress lately.Informed pt we would need cardiac clearance prior to her colon. Pt states she will schedule an appt with Dr Oval Linsey (cardiologist) to get cardiac clearance and then reschedule her colon. Pt understood .Colon on 08/21/16 was cx'd.

## 2016-08-21 ENCOUNTER — Encounter: Payer: BLUE CROSS/BLUE SHIELD | Admitting: Internal Medicine

## 2016-08-29 ENCOUNTER — Other Ambulatory Visit: Payer: Self-pay

## 2016-08-29 DIAGNOSIS — E119 Type 2 diabetes mellitus without complications: Secondary | ICD-10-CM

## 2016-08-29 DIAGNOSIS — I10 Essential (primary) hypertension: Secondary | ICD-10-CM

## 2016-08-29 DIAGNOSIS — E7849 Other hyperlipidemia: Secondary | ICD-10-CM

## 2016-09-12 ENCOUNTER — Other Ambulatory Visit: Payer: BLUE CROSS/BLUE SHIELD

## 2016-09-13 ENCOUNTER — Other Ambulatory Visit: Payer: BLUE CROSS/BLUE SHIELD

## 2016-09-13 DIAGNOSIS — E119 Type 2 diabetes mellitus without complications: Secondary | ICD-10-CM

## 2016-09-13 DIAGNOSIS — E7849 Other hyperlipidemia: Secondary | ICD-10-CM

## 2016-09-13 DIAGNOSIS — I10 Essential (primary) hypertension: Secondary | ICD-10-CM

## 2016-09-13 LAB — TSH: TSH: 3.47 m[IU]/L

## 2016-09-13 LAB — CBC WITH DIFFERENTIAL/PLATELET
BASOS ABS: 57 {cells}/uL (ref 0–200)
Basophils Relative: 1 %
EOS ABS: 114 {cells}/uL (ref 15–500)
Eosinophils Relative: 2 %
HEMATOCRIT: 32.1 % — AB (ref 35.0–45.0)
HEMOGLOBIN: 10.6 g/dL — AB (ref 11.7–15.5)
Lymphocytes Relative: 52 %
Lymphs Abs: 2964 cells/uL (ref 850–3900)
MCH: 28.4 pg (ref 27.0–33.0)
MCHC: 33 g/dL (ref 32.0–36.0)
MCV: 86.1 fL (ref 80.0–100.0)
MONO ABS: 285 {cells}/uL (ref 200–950)
MPV: 10.8 fL (ref 7.5–12.5)
Monocytes Relative: 5 %
NEUTROS ABS: 2280 {cells}/uL (ref 1500–7800)
Neutrophils Relative %: 40 %
Platelets: 316 10*3/uL (ref 140–400)
RBC: 3.73 MIL/uL — ABNORMAL LOW (ref 3.80–5.10)
RDW: 13.6 % (ref 11.0–15.0)
WBC: 5.7 10*3/uL (ref 3.8–10.8)

## 2016-09-13 LAB — LIPID PANEL
CHOLESTEROL: 159 mg/dL (ref 125–200)
HDL: 48 mg/dL (ref >=46)
LDL Cholesterol: 91 mg/dL (ref <130)
TRIGLYCERIDES: 102 mg/dL (ref <150)
Total CHOL/HDL Ratio: 3.3 Ratio (ref <=5.0)
VLDL: 20 mg/dL (ref <30)

## 2016-09-13 LAB — COMPLETE METABOLIC PANEL WITH GFR
ALBUMIN: 4.3 g/dL (ref 3.6–5.1)
ALK PHOS: 56 U/L (ref 33–130)
ALT: 12 U/L (ref 6–29)
AST: 21 U/L (ref 10–35)
BILIRUBIN TOTAL: 0.4 mg/dL (ref 0.2–1.2)
BUN: 14 mg/dL (ref 7–25)
CALCIUM: 9.3 mg/dL (ref 8.6–10.4)
CO2: 23 mmol/L (ref 20–31)
Chloride: 109 mmol/L (ref 98–110)
Creat: 1.1 mg/dL — ABNORMAL HIGH (ref 0.50–0.99)
GFR, EST AFRICAN AMERICAN: 62 mL/min (ref >=60)
GFR, EST NON AFRICAN AMERICAN: 54 mL/min — AB (ref >=60)
GLUCOSE: 100 mg/dL — AB (ref 65–99)
Potassium: 4.6 mmol/L (ref 3.5–5.3)
SODIUM: 141 mmol/L (ref 135–146)
TOTAL PROTEIN: 7.3 g/dL (ref 6.1–8.1)

## 2016-09-13 LAB — HEMOGLOBIN A1C
Hgb A1c MFr Bld: 6.6 % — ABNORMAL HIGH (ref <5.7)
Mean Plasma Glucose: 143 mg/dL

## 2016-09-14 ENCOUNTER — Encounter: Payer: Self-pay | Admitting: Internal Medicine

## 2016-09-14 ENCOUNTER — Ambulatory Visit (INDEPENDENT_AMBULATORY_CARE_PROVIDER_SITE_OTHER): Payer: BLUE CROSS/BLUE SHIELD | Admitting: Internal Medicine

## 2016-09-14 VITALS — BP 138/78 | HR 66 | Temp 97.9°F | Ht 63.0 in | Wt 132.0 lb

## 2016-09-14 DIAGNOSIS — N644 Mastodynia: Secondary | ICD-10-CM

## 2016-09-14 DIAGNOSIS — M21619 Bunion of unspecified foot: Secondary | ICD-10-CM | POA: Diagnosis not present

## 2016-09-14 DIAGNOSIS — Z0000 Encounter for general adult medical examination without abnormal findings: Secondary | ICD-10-CM | POA: Diagnosis not present

## 2016-09-14 DIAGNOSIS — E119 Type 2 diabetes mellitus without complications: Secondary | ICD-10-CM | POA: Diagnosis not present

## 2016-09-14 DIAGNOSIS — Z1231 Encounter for screening mammogram for malignant neoplasm of breast: Secondary | ICD-10-CM | POA: Diagnosis not present

## 2016-09-14 DIAGNOSIS — H8112 Benign paroxysmal vertigo, left ear: Secondary | ICD-10-CM | POA: Diagnosis not present

## 2016-09-14 DIAGNOSIS — R0781 Pleurodynia: Secondary | ICD-10-CM

## 2016-09-14 DIAGNOSIS — I10 Essential (primary) hypertension: Secondary | ICD-10-CM | POA: Diagnosis not present

## 2016-09-14 DIAGNOSIS — I491 Atrial premature depolarization: Secondary | ICD-10-CM | POA: Diagnosis not present

## 2016-09-14 DIAGNOSIS — H6982 Other specified disorders of Eustachian tube, left ear: Secondary | ICD-10-CM | POA: Diagnosis not present

## 2016-09-14 DIAGNOSIS — Z1239 Encounter for other screening for malignant neoplasm of breast: Secondary | ICD-10-CM

## 2016-09-14 NOTE — Progress Notes (Signed)
Patient ID: Michelle Robertson, female   DOB: 21-Apr-1951, 65 y.o.   MRN: [id number]   Location:  [facility]  Place of Service:  OFFICE  Provider: Arletha Grippe, DO  Patient Care Team: Gildardo Cranker, DO as PCP - General (Internal Medicine)  Extended Emergency Contact Information Primary Emergency Contact: Selinda Flavin Address: [address]          [place], [place] [address] Johnnette Litter of [place] Phone: [phone] Mobile Phone: [phone] Relation: Daughter  Code Status: full code Goals of Care: Advanced Directive information Advanced Directives 09/14/2016  Does patient have an advance directive? No  Would patient like information on creating an advanced directive? No - patient declined information  Pre-existing out of facility DNR order (yellow form or pink MOST form) -     Chief Complaint  Patient presents with  . Annual Exam    Yearly Exam  . Flu Vaccine    refused    HPI: Patient is a 65 y.o. female seen in today for an annual wellness exam.  She is c/a left ear "sounds different" than right. No pain, d/c, tinnitus. No known trauma. She also c/o leaning to one side/forward with ambulation at times. No dizziness. No staggering.   She never followed through with colonoscopy as she was told she needed cardiac clearance. appt has not been scheduled  She has a painful mole on left posterior forearm. Mole has been present since birth but over the yrs it has gotten painful whenever hit against another object. No redness or bleeding. No ulceration.  She has painful R>L bunion x 2 yrs. She saw podiatry and recommended sx but she declined procedure.   She has 2 lower teeth that needs to be pulled and has taken abx x 2 which helped. She is followed by dentist but has not seen them in several mos due to lack of insurance coverage  She has increased urinary frequency with 6-10 x nocturia. No leakage during the day. Sx's occur at night. Sx's interrupt sleep.   HTN/palpitations -  BP/HR stable on metoprolol. Occasional palpitations but much improved on BB. Followed by cardio Dr Oval Linsey  GAD - has taken klonopin in past. Declines routine med  Hyperlipidemia - takes crestor since May 2017. Followed by cardio. She has not had repeat labs yet to determine efficacy  BPPV - intermittent sx's. Takes OTC motion sickness med which helps  DM - she takes metformin. BS 80s most days but has been fluctuating the last week. Last A1c 6.9%. She cancelled her eye appt  Depression screen [facility] 2/9 09/14/2016 06/13/2016  Decreased Interest 0 0  Down, Depressed, Hopeless 0 0  PHQ - 2 Score 0 0    Fall Risk  09/14/2016 06/13/2016  Falls in the past year? Yes Yes  Number falls in past yr: 1 1  Injury with Fall? No Yes   MMSE - Mini Mental State Exam 09/14/2016  Orientation to time 4  Orientation to Place 5  Registration 3  Attention/ Calculation 5  Recall 3  Language- name 2 objects 2  Language- repeat 1  Language- follow 3 step command 3  Language- read & follow direction 1  Write a sentence 1  Copy design 1  Total score 29     Health Maintenance  Topic Date Due  . HIV Screening  06/21/1968  . COLONOSCOPY  06/22/2003  . PAP SMEAR  07/05/2013  . OPHTHALMOLOGY EXAM  12/21/2015  . PNEUMOCOCCAL POLYSACCHARIDE VACCINE (1) 10/19/2016 (Originally 06/22/1953)  .  INFLUENZA VACCINE  10/19/2016 (Originally 06/19/2016)  . MAMMOGRAM  06/07/2017 (Originally 04/23/2014)  . ZOSTAVAX  06/07/2017 (Originally 06/21/2013)  . HEMOGLOBIN A1C  03/14/2017  . FOOT EXAM  06/13/2017  . URINE MICROALBUMIN  06/13/2017  . TETANUS/TDAP  07/05/2020  . Hepatitis C Screening  Completed    Urinary incontinence? No issues  Functional Status Survey: Is the patient deaf or have difficulty hearing?: No Does the patient have difficulty seeing, even when wearing glasses/contacts?: Yes (she does not have Rx glasses) Does the patient have difficulty concentrating, remembering, or making decisions?: Yes Does  the patient have difficulty walking or climbing stairs?: Yes Does the patient have difficulty dressing or bathing?: No Does the patient have difficulty doing errands alone such as visiting a doctor's office or shopping?: No  Exercise? Stays active at home and in community has regular routine  Diet? Maintains healthy food choices  No exam data present    Dentition: poor and unable to afford dentist  Pain: in knees  Past Medical History:  Diagnosis Date  . Diabetes mellitus without complication ([place])   . Ectopic pregnancy   . Essential hypertension 01/30/2016  . History of palpitations   . PAC (premature atrial contraction) 07/28/2016  . PVC (premature ventricular contraction)    hx of PVC's and PAC's  . SOB (shortness of breath) on exertion       Past Surgical History:  Procedure Laterality Date  . TUBAL LIGATION      Family History  Problem Relation Age of Onset  . Diabetes Mother   . Stroke Mother   . Diabetes Maternal Aunt   . Diabetes Paternal Grandmother   . Cancer Father    Family Status  Relation Status  . Mother   . Maternal Aunt   . Paternal Grandmother   . Father     Social History   Social History  . Marital status: Widowed    Spouse name: N/A  . Number of children: N/A  . Years of education: N/A   Occupational History  . Not on file.   Social History Main Topics  . Smoking status: Never Smoker  . Smokeless tobacco: Never Used  . Alcohol use No  . Drug use: No  . Sexual activity: Not on file   Other Topics Concern  . Not on file   Social History Narrative  . No narrative on file    No Known Allergies    Medication List       Accurate as of 09/14/16  3:38 PM. Always use your most recent med list.          clonazePAM 0.5 MG tablet Commonly known as:  KLONOPIN Take 1 tablet (0.5 mg total) by mouth 2 (two) times daily as needed. for anxiety   glucose blood test strip Commonly known as:  BAYER CONTOUR TEST 1 each by Other  route 3 (three) times daily. Use to check blood sugars three times of day Dx E11.9   metFORMIN 500 MG tablet Commonly known as:  GLUCOPHAGE TAKE 1 TABLET BY MOUTH TWICE DAILY WITH A MEAL   metoprolol succinate 50 MG 24 hr tablet Commonly known as:  TOPROL XL Take 1 tablet (50 mg total) by mouth daily.   rosuvastatin 20 MG tablet Commonly known as:  CRESTOR Take 1 tablet (20 mg total) by mouth daily.   VISINE OP Place 2 drops into both eyes as needed (for dry eyes).   Vitamin D 2000 units Caps Take by mouth.  Review of Systems:  Review of Systems  Constitutional: Positive for fatigue.  HENT: Negative for tinnitus.        Bleeding/painful gums  Eyes:       Dry eyes  Cardiovascular: Positive for palpitations.  Gastrointestinal: Positive for nausea.  Genitourinary: Positive for frequency.  Musculoskeletal: Positive for gait problem.       Bunion  Psychiatric/Behavioral: The patient is nervous/anxious.   All other systems reviewed and are negative.   Physical Exam: Vitals:   09/14/16 1449  BP: 138/78  Pulse: 66  Temp: 97.9 F (36.6 C)  TempSrc: Oral  SpO2: 97%  Weight: 132 lb (59.9 kg)  Height: 5\' 3"  (1.6 m)   Body mass index is 23.38 kg/m. Physical Exam  Constitutional: She is oriented to person, place, and time. She appears well-developed and well-nourished. No distress.  HENT:  Head: Normocephalic and atraumatic.  Right Ear: Hearing, tympanic membrane, external ear and ear canal normal.  Left Ear: Hearing, tympanic membrane, external ear and ear canal normal.  Mouth/Throat: Uvula is midline, oropharynx is clear and moist and mucous membranes are normal. She does not have dentures.  Eyes: Conjunctivae, EOM and lids are normal. Pupils are equal, round, and reactive to light. No scleral icterus.  Neck: Trachea normal and normal range of motion. Neck supple. Carotid bruit is not present. No thyroid mass and no thyromegaly present.  Cardiovascular: Normal  rate, regular rhythm and intact distal pulses.  Exam reveals no gallop and no friction rub.   Murmur (2/6 SEM) heard. No carotid bruit b/l. No LE edema b/l. No calf TTP.   Pulmonary/Chest: Effort normal and breath sounds normal. She has no wheezes. She has no rhonchi. She has no rales. Right breast exhibits mass and tenderness. Right breast exhibits no inverted nipple, no nipple discharge and no skin change. Left breast exhibits mass and tenderness. Left breast exhibits no inverted nipple, no nipple discharge and no skin change. Breasts are symmetrical.  Abdominal: Soft. Normal appearance, normal aorta and bowel sounds are normal. She exhibits no pulsatile midline mass and no mass. There is no hepatosplenomegaly. There is no tenderness. There is no rigidity, no rebound and no guarding. No hernia.  Musculoskeletal: Normal range of motion. She exhibits edema and tenderness (left costal angle at rib 8-9 stuck up).  paravetebral thoracolu,bar muscle hypertrophy with ropy tissue texture changes  Lymphadenopathy:       Head (right side): No posterior auricular adenopathy present.       Head (left side): No posterior auricular adenopathy present.    She has no cervical adenopathy.       Right: No supraclavicular adenopathy present.       Left: No supraclavicular adenopathy present.  Neurological: She is alert and oriented to person, place, and time. She has normal strength and normal reflexes. No cranial nerve deficit. Gait normal.  Skin: Skin is warm, dry and intact. No rash noted. Nails show no clubbing.  Psychiatric: She has a normal mood and affect. Her speech is normal and behavior is normal. Thought content normal. Cognition and memory are normal.   Diabetic Foot Exam - Simple   Simple Foot Form Diabetic Foot exam was performed with the following findings:  Yes 09/14/2016  3:59 PM  Visual Inspection See comments:  Yes Sensation Testing Intact to touch and monofilament testing bilaterally:   Yes Pulse Check Posterior Tibialis and Dorsalis pulse intact bilaterally:  Yes Comments B/l large TTP bunion. No calluses or ulcerations  Labs reviewed:  Basic Metabolic Panel:  Recent Labs  03/21/16 0828 06/13/16 0959 09/13/16 0817  NA 139 141 141  K 4.4 4.9 4.6  CL 107 108 109  CO2 25 24 23   GLUCOSE 126* 107* 100*  BUN 16 16 14   CREATININE 0.96 1.13* 1.10*  CALCIUM 9.4 9.4 9.3  TSH  --   --  3.47   Liver Function Tests:  Recent Labs  01/05/16 1328 03/21/16 0828 09/13/16 0817  AST 16 17 21   ALT 8 12 12   ALKPHOS 54 54 56  BILITOT 0.3 0.5 0.4  PROT 7.8 7.1 7.3  ALBUMIN 4.8 4.5 4.3   No results for input(s): LIPASE, AMYLASE in the last 8760 hours. No results for input(s): AMMONIA in the last 8760 hours. CBC:  Recent Labs  01/27/16 2024 09/13/16 0817  WBC 9.8 5.7  NEUTROABS  --  2,280  HGB 11.2* 10.6*  HCT 34.0* 32.1*  MCV 86.7 86.1  PLT 338 316   Lipid Panel:  Recent Labs  03/21/16 0828 09/13/16 0817  CHOL 258* 159  HDL 50 48  LDLCALC 172* 91  TRIG 179* 102  CHOLHDL 5.2* 3.3   Lab Results  Component Value Date   HGBA1C 6.6 (H) 09/13/2016    Procedures: No results found.  Assessment/Plan   ICD-9-CM ICD-10-CM   1. Well adult exam V70.0 Z00.00   2. Rib pain on left side 786.50 R07.81 DG Ribs Unilateral Left  3. Controlled type 2 diabetes mellitus without complication, without long-term current use of insulin (HCC) 250.00 E11.9   4. PAC (premature atrial contraction) 427.61 I49.1   5. Essential hypertension 401.9 I10   6. Benign paroxysmal positional vertigo of left ear 386.11 H81.12   7. BUNIONS, BILATERAL 727.1 M21.619   8. Dysfunction of left eustachian tube 381.81 H69.82   9. Breast tenderness in female 611.71 N64.4    with probable fibrocystic disease  10. Breast cancer screening V76.10 Z12.31 MM Digital Screening    Pt is UTD on health maintenance. Vaccinations are UTD. Pt maintains a healthy lifestyle. Encouraged pt to  exercise 30-45 minutes 4-5 times per week. Eat a well balanced diet. Avoid smoking. Limit alcohol intake. Wear seatbelt when riding in the car. Wear sun block (SPF >50) when spending extended times outside.  Recommend plain OTC claritin, allegra or zyrtec for ear fullness  Continue other medications as ordered  Will call with xray results  Follow up with specialists for colonoscopy and eye exam  Follow up in 3 mos for routine visit. Fasting labs prior to appt    [place] S. Perlie Gold  [facility] [address] [place], [place] [address] [phone] Cell (Monday-Friday 8 AM - 5 PM) [phone] After 5 PM and follow prompts

## 2016-09-14 NOTE — Patient Instructions (Addendum)
Encouraged pt to exercise 30-45 minutes 4-5 times per week. Eat a well balanced diet. Avoid smoking. Limit alcohol intake. Wear seatbelt when riding in the car. Wear sun block (SPF >50) when spending extended times outside.  Recommend plain OTC claritin, allegra or zyrtec for ear fullness  Continue other medications as ordered  Will call with xray results  Follow up with specialists for colonoscopy and eye exam  Follow up in 3 mos for routine visit. Fasting labs prior to appt

## 2016-10-04 ENCOUNTER — Other Ambulatory Visit: Payer: Self-pay | Admitting: Cardiovascular Disease

## 2016-10-04 NOTE — Telephone Encounter (Signed)
Rx(s) sent to pharmacy electronically.  

## 2016-10-04 NOTE — Telephone Encounter (Signed)
Please review for refill. Thanks!  

## 2016-10-10 ENCOUNTER — Other Ambulatory Visit: Payer: Self-pay | Admitting: Cardiovascular Disease

## 2016-10-11 ENCOUNTER — Other Ambulatory Visit: Payer: Self-pay | Admitting: Internal Medicine

## 2016-10-15 NOTE — Telephone Encounter (Signed)
erc

## 2016-10-15 NOTE — Telephone Encounter (Signed)
Please review for refill. Thanks!  

## 2016-10-17 NOTE — Telephone Encounter (Signed)
Please review for refill. Thanks!  

## 2016-10-23 ENCOUNTER — Ambulatory Visit: Payer: BLUE CROSS/BLUE SHIELD

## 2016-11-13 ENCOUNTER — Ambulatory Visit
Admission: RE | Admit: 2016-11-13 | Discharge: 2016-11-13 | Disposition: A | Discharge status: Home / Self Care | Payer: BLUE CROSS/BLUE SHIELD | Source: Ambulatory Visit | Attending: Internal Medicine | Admitting: Internal Medicine

## 2016-11-13 DIAGNOSIS — Z1239 Encounter for other screening for malignant neoplasm of breast: Secondary | ICD-10-CM

## 2016-11-28 ENCOUNTER — Other Ambulatory Visit: Payer: Self-pay | Admitting: *Deleted

## 2016-11-28 DIAGNOSIS — F411 Generalized anxiety disorder: Secondary | ICD-10-CM

## 2016-11-28 MED ORDER — CLONAZEPAM 0.5 MG PO TABS: 0.5000 mg | ORAL_TABLET | Freq: Two times a day (BID) | ORAL | 0 refills | Status: DC | PRN

## 2016-11-28 NOTE — Telephone Encounter (Signed)
[facility] Patient needs an appointment before anymore refills after this one.

## 2016-12-19 ENCOUNTER — Other Ambulatory Visit: Payer: BLUE CROSS/BLUE SHIELD

## 2016-12-21 ENCOUNTER — Ambulatory Visit: Payer: BLUE CROSS/BLUE SHIELD | Admitting: Internal Medicine

## 2017-03-11 IMAGING — CR DG KNEE COMPLETE 4+V*L*
4 series · 4 of 4 positions shown · non-contrast
Comparison: None.

CLINICAL DATA: Left knee pain for 1 month.  No known injury.

EXAM:
LEFT KNEE - COMPLETE 4+ VIEW

[view not recorded (1 of 4)]
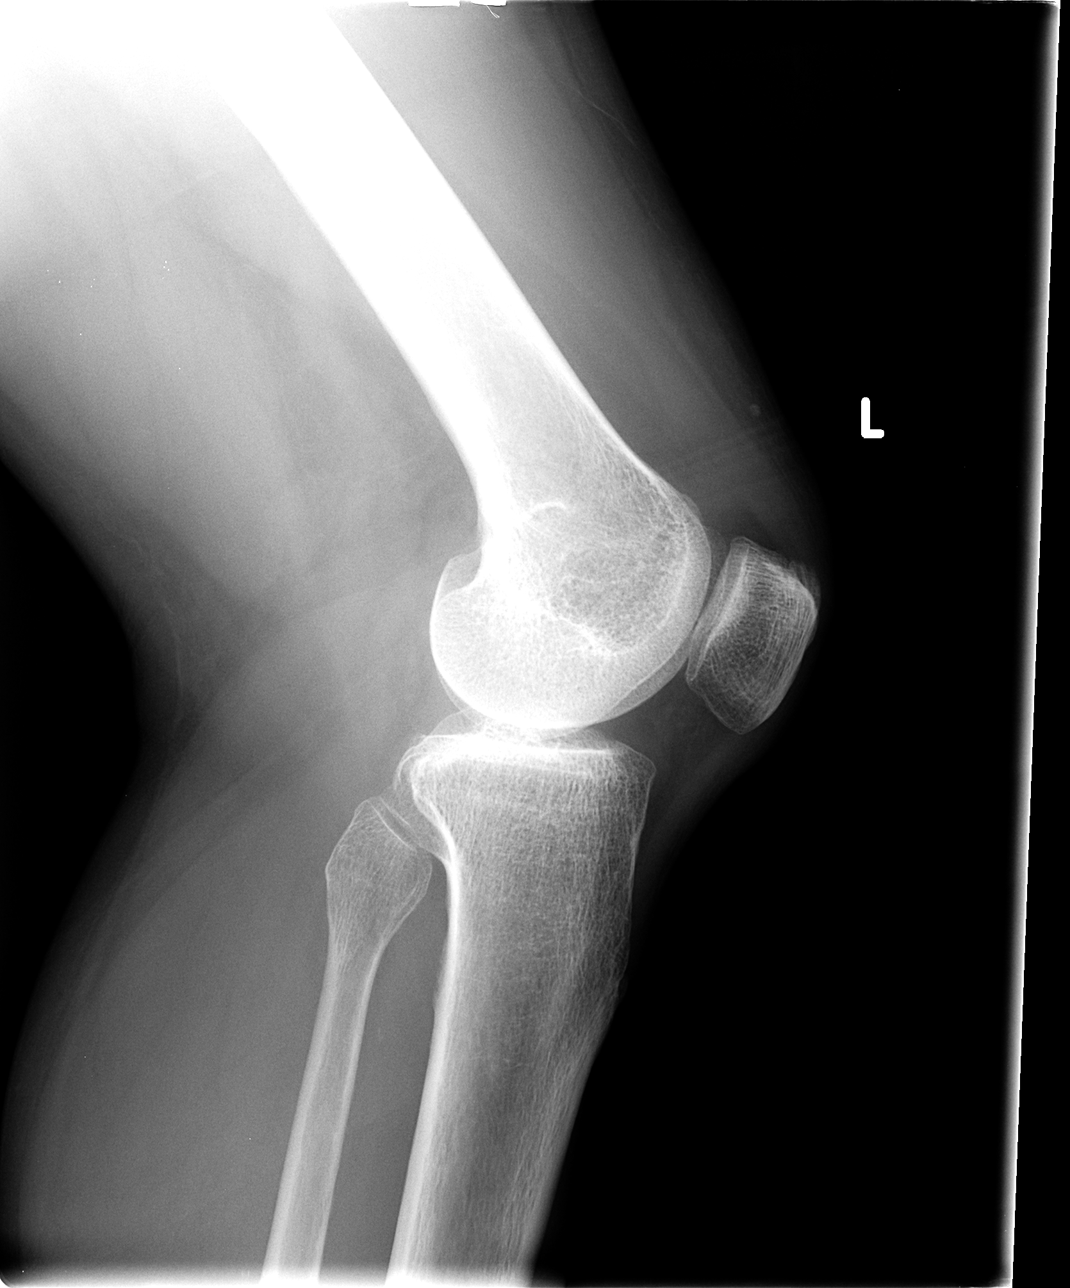

[view not recorded (2 of 4)]
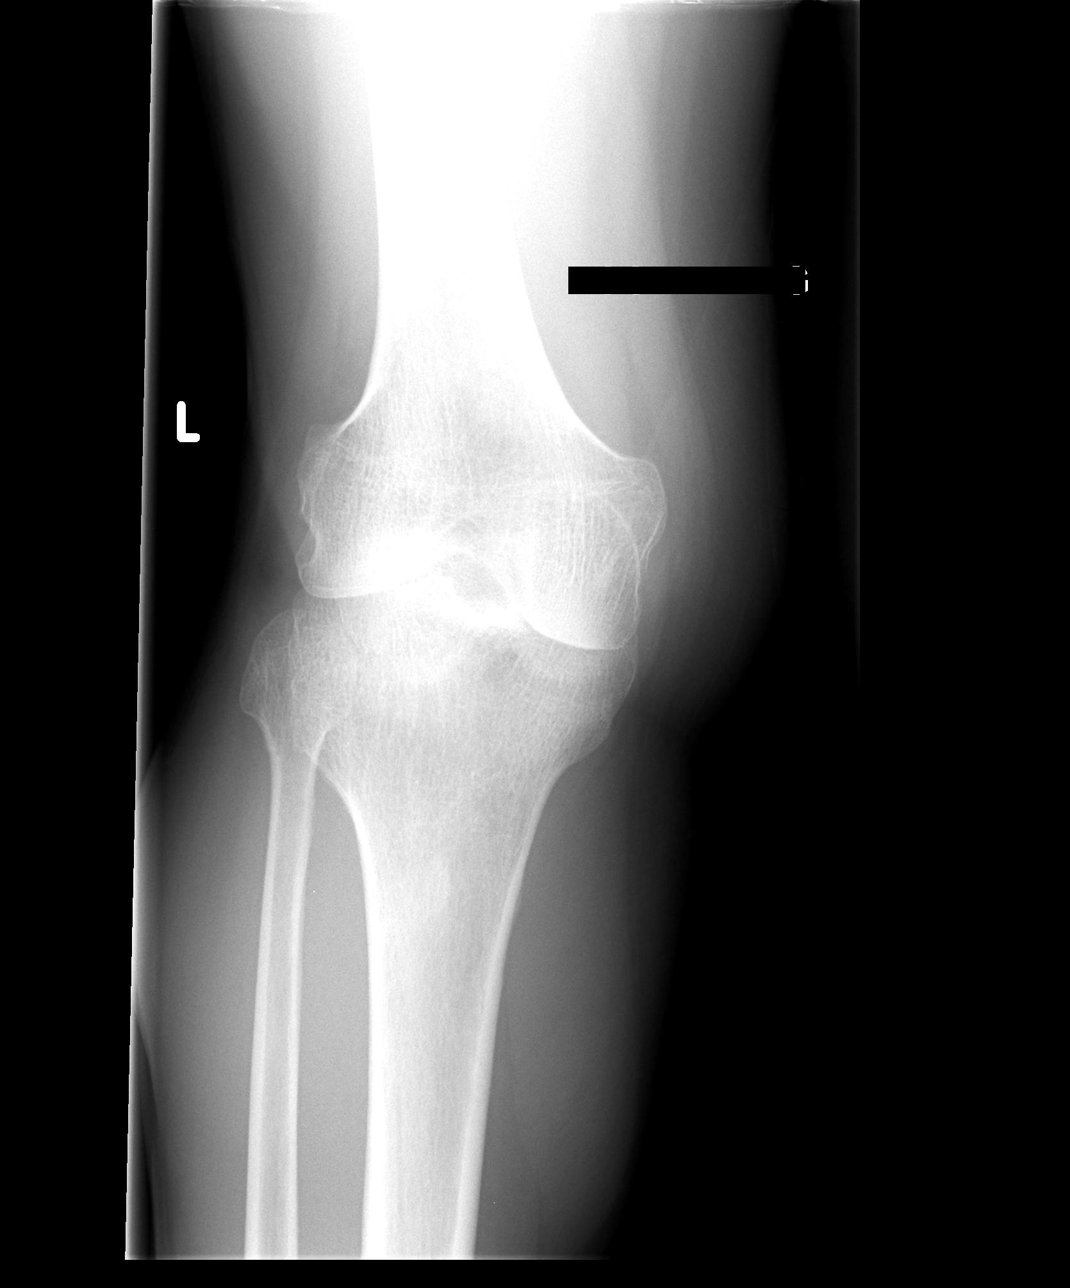

[view not recorded (3 of 4)]
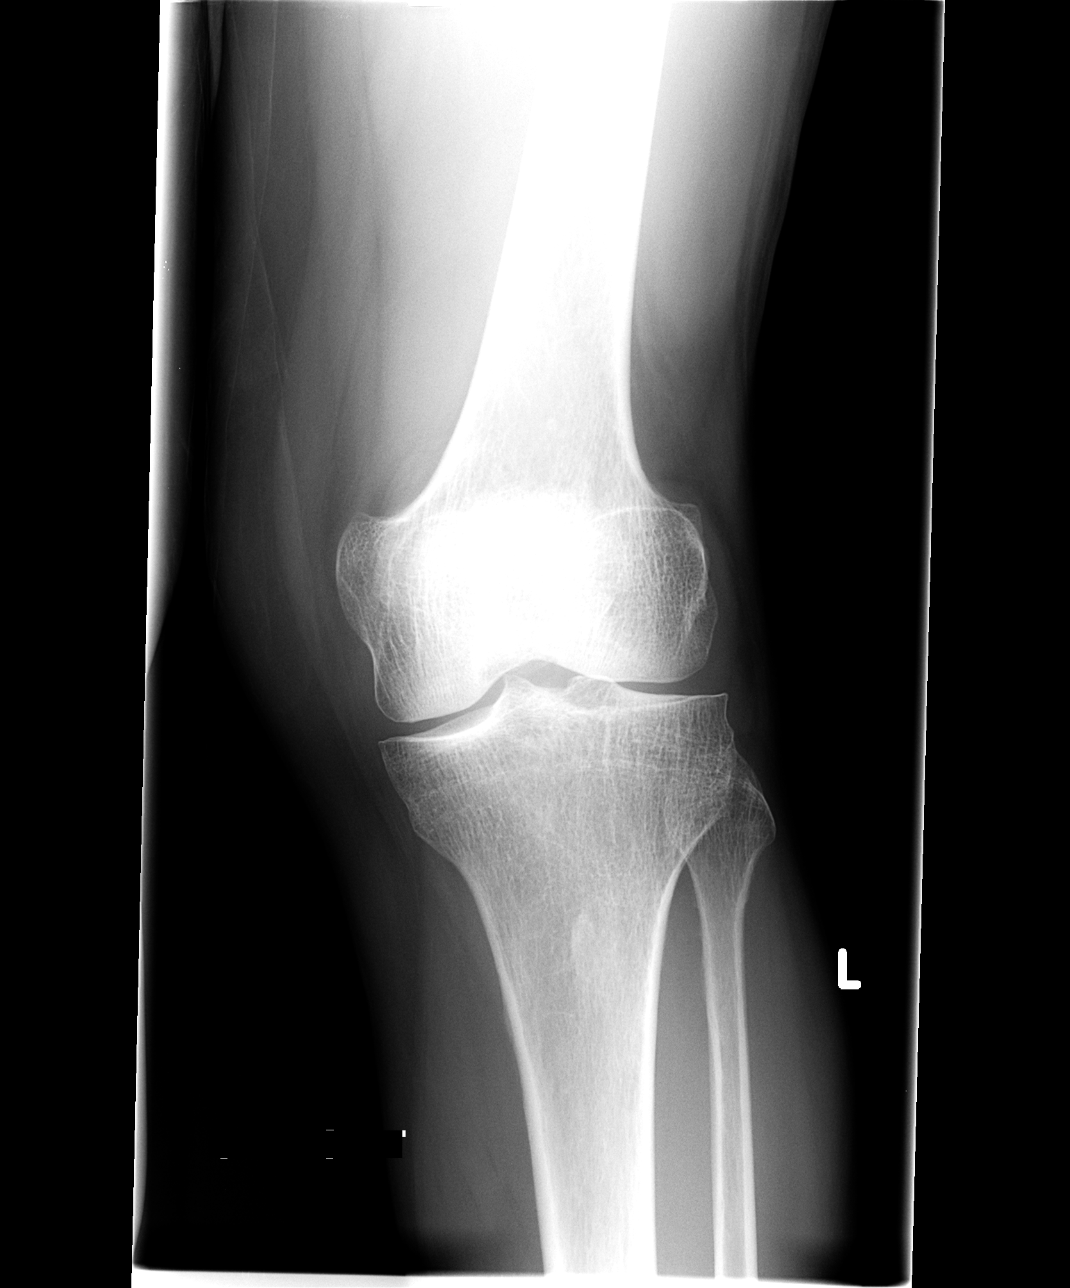

[view not recorded (4 of 4)]
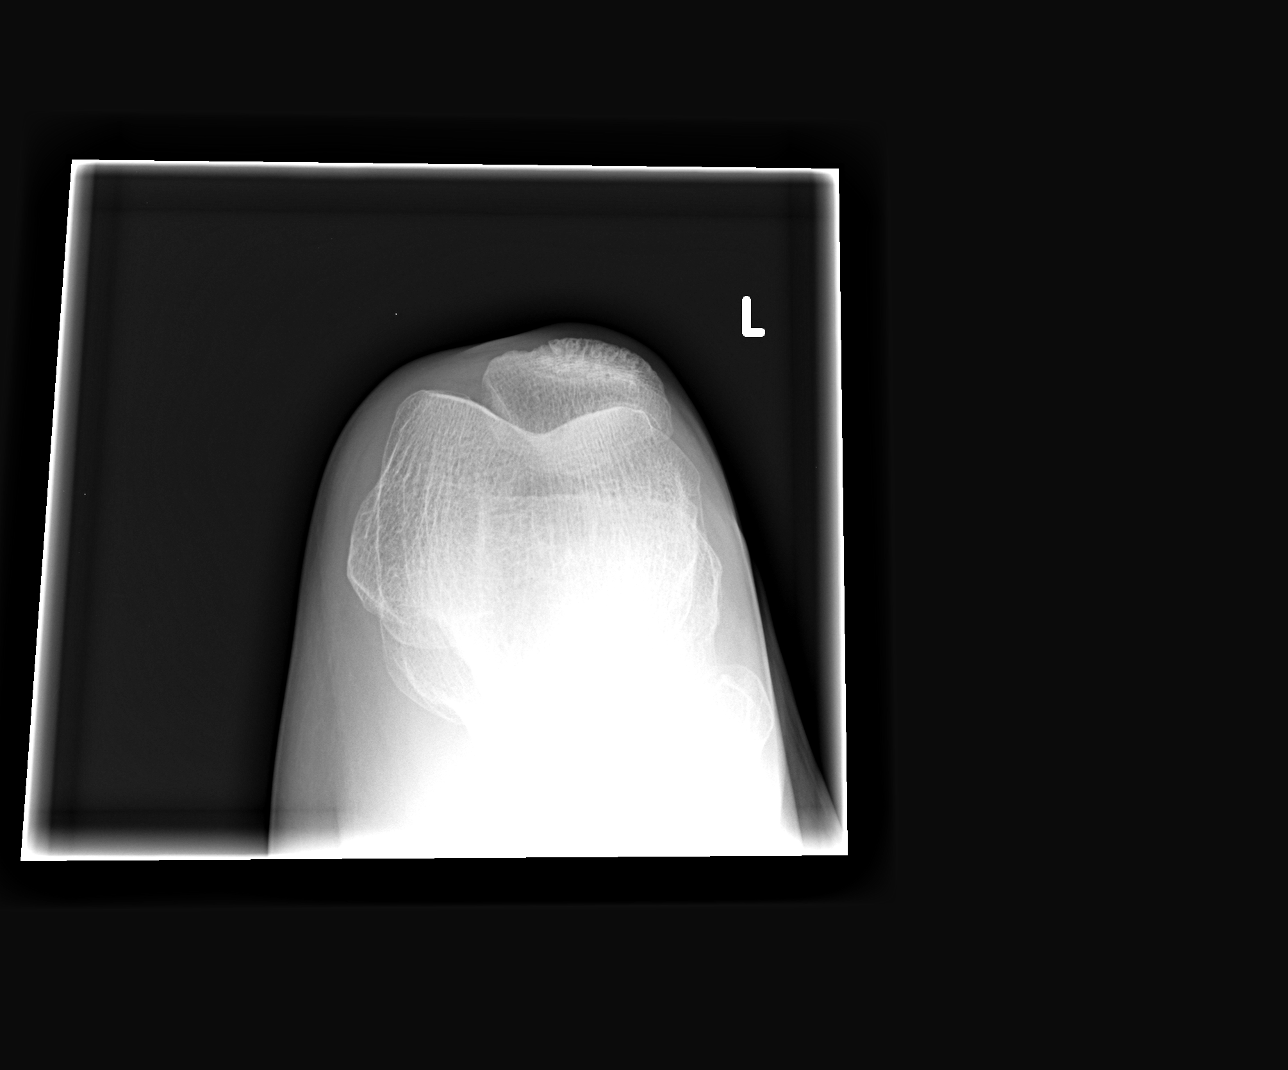

[4 of 4 positions shown; findings below may reference images not displayed]

FINDINGS: There is no evidence of fracture, dislocation, or joint effusion.
There is no evidence of arthropathy or other focal bone abnormality.
Soft tissues are unremarkable.
IMPRESSION: Negative.

## 2017-07-09 ENCOUNTER — Encounter: Payer: Self-pay | Admitting: Internal Medicine

## 2017-10-23 ENCOUNTER — Ambulatory Visit: Payer: BLUE CROSS/BLUE SHIELD | Admitting: Internal Medicine

## 2017-11-01 ENCOUNTER — Telehealth: Payer: Self-pay | Admitting: *Deleted

## 2017-11-01 DIAGNOSIS — I10 Essential (primary) hypertension: Secondary | ICD-10-CM

## 2017-11-01 MED ORDER — METOPROLOL SUCCINATE ER 25 MG PO TB24: 25.0000 mg | ORAL_TABLET | Freq: Every day | ORAL | 1 refills | Status: DC

## 2017-11-01 NOTE — Telephone Encounter (Signed)
Medication refill sent to pharmacy. Referral placed. LM on patient cell.

## 2017-11-01 NOTE — Telephone Encounter (Signed)
Patient called and stated that she needs a refill on her BP medication. I instructed the patient that she would need to schedule an appointment before we could refill the medication because it has been over a year since we seen her. Has cancelled and no showed several appointment. Patient stated [place] and her insurance has lapsed and she has been working on trying to get more insurance. Patient stated that she could not afford out of pocket pay for an appointment. Asks if we could please just call in her medication while she tries to get insurance straighten out. Please Advise.

## 2017-11-01 NOTE — Telephone Encounter (Signed)
Ok x 1 RF for 30 day supplies but no further med refills until she is seen tin the office; recommend she be referred to [facility] for med assistance; may need to be seen at the community clinic

## 2017-11-06 ENCOUNTER — Other Ambulatory Visit: Payer: Self-pay | Admitting: Cardiovascular Disease

## 2017-11-06 NOTE — Telephone Encounter (Signed)
Please review for refill. Thanks!  

## 2017-11-18 DIAGNOSIS — E119 Type 2 diabetes mellitus without complications: Secondary | ICD-10-CM | POA: Insufficient documentation

## 2017-11-18 DIAGNOSIS — O009 Unspecified ectopic pregnancy without intrauterine pregnancy: Secondary | ICD-10-CM | POA: Insufficient documentation

## 2017-11-18 DIAGNOSIS — Z87898 Personal history of other specified conditions: Secondary | ICD-10-CM | POA: Insufficient documentation

## 2017-11-18 DIAGNOSIS — I493 Ventricular premature depolarization: Secondary | ICD-10-CM | POA: Insufficient documentation

## 2017-11-18 DIAGNOSIS — R0602 Shortness of breath: Secondary | ICD-10-CM | POA: Insufficient documentation

## 2017-12-02 NOTE — Progress Notes (Signed)
Cardiology Office Note   Date:  12/05/2017   ID:  Lyndsi Altic, DOB 1951-08-26, MRN [id number]  PCP:  Gildardo Cranker, DO  Cardiologist:   Skeet Latch, MD   No chief complaint on file.   Patient ID: Michelle Robertson is a 67 y.o. female with diabetes mellitus type 2, PACs, PVCs and hyperlipidemia who presents for follow up.  Ms. Stiverson was seen in the ED on 01/2016 with palpitations.  She reported intermittent episodes of nausea and palpitations.  It was associated with shortness of breath, nausea and occasionally dizziness.   In the ED EKG revealed sinus rhythm with occasional PVCs.  Labs revealed mild anemia that was stable and no electrolyte abnormalities.  Ms. Burrough had an echo in 2014 that revealed LVEF 65-70% and was otherwise unremarkable.  She was started on metoprolol 25mg  daily, which helped.   She wore a 7 day event monitor that showed occasional PACs.  At her last appointment Ms. Rengel' blood pressure was poorly controlled and she had frequent palpitations.  Therefore metoprolol was increased.  She was also started on rosuvastatin.     Ms. Kiss' husband passed away 8/201.  She went home to [place] for a year.  While there she was settling his estate and was under a lot of stress.  She had some episodes of high blood pressure and palpitations.  Since she has returned she experienced a couple episodes of palpitations but fewer than when she was away.  Overall she has been feeling well.  She denies chest pain or shortness of breath.  She checks her blood pressure and only takes her medication if her SBP is >150.  It is occasionally low in the 90s/60s but most are high.  It has been as high as 465 systolic.   Ms. Moeckel has not been exercising lately.  She tries to climb her stairs but does not get much formal exercise.  She gets tired with exertion.  However she denies chest pain.  She denies lower extremity edema, orthopnea, or PND.  The only time she feels short of breath is  when her blood pressure is high.   History of Present Illness 01/30/16: Past Medical History:  Diagnosis Date  . Diabetes mellitus without complication ([place])   . Ectopic pregnancy   . Essential hypertension 01/30/2016  . History of palpitations   . PAC (premature atrial contraction) 07/28/2016  . PVC (premature ventricular contraction)    hx of PVC's and PAC's  . SOB (shortness of breath) on exertion     Past Surgical History:  Procedure Laterality Date  . TUBAL LIGATION       Current Outpatient Medications  Medication Sig Dispense Refill  . metFORMIN (GLUCOPHAGE) 500 MG tablet TAKE 1 TABLET BY MOUTH TWICE DAILY WITH A MEAL 180 tablet 0  . metoprolol succinate (TOPROL-XL) 50 MG 24 hr tablet TAKE 1 TABLET BY MOUTH DAILY 15 tablet 0  . Tetrahydrozoline HCl (VISINE OP) Place 2 drops into both eyes as needed (for dry eyes).    . Cholecalciferol (VITAMIN D) 2000 units CAPS Take by mouth.    . clonazePAM (KLONOPIN) 0.5 MG tablet Take 1 tablet (0.5 mg total) by mouth 2 (two) times daily as needed. for anxiety (Patient not taking: Reported on 12/03/2017) 60 tablet 0  . glucose blood (BAYER CONTOUR TEST) test strip 1 each by Other route 3 (three) times daily. Use to check blood sugars three times of day Dx E11.9 (Patient not taking: Reported on  12/03/2017) 100 each 11  . rosuvastatin (CRESTOR) 20 MG tablet TAKE 1 TABLET BY MOUTH DAILY (Patient not taking: Reported on 12/03/2017) 30 tablet 10   No current facility-administered medications for this visit.     Allergies:   Patient has no known allergies.    Social History:  The patient  reports that  has never smoked. she has never used smokeless tobacco. She reports that she does not drink alcohol or use drugs.   Family History:  The patient's family history includes Cancer in her father; Diabetes in her maternal aunt, mother, and paternal grandmother; Stroke in her mother.    ROS:  Please see the history of present illness.   Otherwise,  review of systems are positive for none.   All other systems are reviewed and negative.    PHYSICAL EXAM: VS:  BP (!) 146/72   Pulse 63   Ht 5\' 3"  (1.6 m)   Wt 129 lb (58.5 kg)   SpO2 99%   BMI 22.85 kg/m  , BMI Body mass index is 22.85 kg/m. GENERAL:  Well appearing HEENT: Pupils equal round and reactive, fundi not visualized, oral mucosa unremarkable NECK:  No jugular venous distention, waveform within normal limits, carotid upstroke brisk and symmetric, no bruits, no thyromegaly LUNGS:  Clear to auscultation bilaterally HEART:  RRR.  PMI not displaced or sustained,S1 and S2 within normal limits, no S3, no S4, no clicks, no rubs, no murmurs ABD:  Flat, positive bowel sounds normal in frequency in pitch, no bruits, no rebound, no guarding, no midline pulsatile mass, no hepatomegaly, no splenomegaly EXT:  2 plus pulses throughout, no edema, no cyanosis no clubbing SKIN:  No rashes no nodules NEURO:  Cranial nerves II through XII grossly intact, motor grossly intact throughout PSYCH:  Cognitively intact, oriented to person place and time   EKG:  EKG is ordered today. 03/12/16: Sinus bradycardia.  Rate 59 bpm.   12/03/17: Sinus rhythm.  Rate 63 bpm.    7 Day Event Monitor 03/20/16:  Quality: Fair.  Baseline artifact. Predominant rhythm: sinus rhythm, sinus bradycardia Average heart rate: 70 bpm Max heart rate: 121 bpm Min heart rate: 47 bpm Pauses >2.5 seconds: 0  Occasional PACs.   Recent Labs: No results found for requested labs within last 8760 hours.    Lipid Panel    Component Value Date/Time   CHOL 159 09/13/2016 0817   TRIG 102 09/13/2016 0817   HDL 48 09/13/2016 0817   CHOLHDL 3.3 09/13/2016 0817   VLDL 20 09/13/2016 0817   LDLCALC 91 09/13/2016 0817   LDLDIRECT 134.0 07/19/2015 1541      Wt Readings from Last 3 Encounters:  12/03/17 129 lb (58.5 kg)  09/14/16 132 lb (59.9 kg)  08/14/16 129 lb 9.6 oz (58.8 kg)      ASSESSMENT AND PLAN:  #  PACs/PVCs: Better-controlled.  Continue metoprolol.  I encouraged her to actually take it each day unless her blood pressure is truly low.  Her goal is less than 130/80.  # Hypertension: BP is above goal.  Ms. Hoppes was encouraged to take her metoprolol as above.  She will track her blood pressures.  # Hyperlipidemia: Check lipids and CMP.  # Exertional dyspnea:  We will check a Lexiscan Myoview given her exertional symptoms.   Current medicines are reviewed at length with the patient today.  The patient does not have concerns regarding medicines.  The following changes have been made: None  Labs/ tests ordered today  include:   Orders Placed This Encounter  Procedures  . Lipid panel  . Comprehensive metabolic panel  . MYOCARDIAL PERFUSION IMAGING  . EKG 12-Lead     Disposition:   FU with Legend Pecore C. Oval Linsey, MD, [facility] in 2 months   This note was written with the assistance of speech recognition software.  Please excuse any transcriptional errors.  Signed, Chudney Scheffler C. Oval Linsey, MD, [facility]  12/05/2017 9:19 PM    [place]

## 2017-12-03 ENCOUNTER — Encounter: Payer: Self-pay | Admitting: Cardiovascular Disease

## 2017-12-03 ENCOUNTER — Ambulatory Visit: Payer: BLUE CROSS/BLUE SHIELD | Admitting: Cardiovascular Disease

## 2017-12-03 VITALS — BP 146/72 | HR 63 | Ht 63.0 in | Wt 129.0 lb

## 2017-12-03 DIAGNOSIS — I491 Atrial premature depolarization: Secondary | ICD-10-CM | POA: Diagnosis not present

## 2017-12-03 DIAGNOSIS — E7800 Pure hypercholesterolemia, unspecified: Secondary | ICD-10-CM

## 2017-12-03 DIAGNOSIS — E7849 Other hyperlipidemia: Secondary | ICD-10-CM | POA: Diagnosis not present

## 2017-12-03 DIAGNOSIS — H9319 Tinnitus, unspecified ear: Secondary | ICD-10-CM

## 2017-12-03 DIAGNOSIS — I10 Essential (primary) hypertension: Secondary | ICD-10-CM

## 2017-12-03 DIAGNOSIS — R0602 Shortness of breath: Secondary | ICD-10-CM | POA: Diagnosis not present

## 2017-12-03 NOTE — Patient Instructions (Signed)
Medication Instructions:  Your physician recommends that you continue on your current medications as directed. Please refer to the Current Medication list given to you today.  Labwork: FASTING LP/CMET WHEN YOU RETURN FOR YOUR LEXISCAN   Testing/Procedures: Your physician has requested that you have a carotid duplex. This test is an ultrasound of the carotid arteries in your neck. It looks at blood flow through these arteries that supply the brain with blood. Allow one hour for this exam. There are no restrictions or special instructions.  Your physician has requested that you have a lexiscan myoview. For further information please visit [email or web address]. Please follow instruction sheet, as given.  Follow-Up: Your physician recommends that you schedule a follow-up appointment in: 2 MONTH OV   Any Other Special Instructions Will Be Listed Below (If Applicable). MONITOR YOUR BLOOD PRESSURE AND BRING YOUR READINGS TO YOUR FOLLOW UP APPOINTMENT   If you need a refill on your cardiac medications before your next appointment, please call your pharmacy.

## 2017-12-05 ENCOUNTER — Encounter: Payer: Self-pay | Admitting: Cardiovascular Disease

## 2017-12-06 ENCOUNTER — Encounter: Payer: Self-pay | Admitting: Internal Medicine

## 2017-12-06 ENCOUNTER — Ambulatory Visit: Payer: BLUE CROSS/BLUE SHIELD | Admitting: Internal Medicine

## 2017-12-06 VITALS — BP 118/66 | HR 56 | Temp 98.0°F | Ht 63.0 in | Wt 130.0 lb

## 2017-12-06 DIAGNOSIS — E2839 Other primary ovarian failure: Secondary | ICD-10-CM | POA: Diagnosis not present

## 2017-12-06 DIAGNOSIS — D229 Melanocytic nevi, unspecified: Secondary | ICD-10-CM

## 2017-12-06 DIAGNOSIS — F411 Generalized anxiety disorder: Secondary | ICD-10-CM

## 2017-12-06 DIAGNOSIS — E119 Type 2 diabetes mellitus without complications: Secondary | ICD-10-CM | POA: Diagnosis not present

## 2017-12-06 DIAGNOSIS — I10 Essential (primary) hypertension: Secondary | ICD-10-CM | POA: Diagnosis not present

## 2017-12-06 DIAGNOSIS — I491 Atrial premature depolarization: Secondary | ICD-10-CM

## 2017-12-06 DIAGNOSIS — E785 Hyperlipidemia, unspecified: Secondary | ICD-10-CM

## 2017-12-06 DIAGNOSIS — M21619 Bunion of unspecified foot: Secondary | ICD-10-CM

## 2017-12-06 DIAGNOSIS — E1169 Type 2 diabetes mellitus with other specified complication: Secondary | ICD-10-CM

## 2017-12-06 MED ORDER — CLONAZEPAM 0.5 MG PO TABS: 0.5000 mg | ORAL_TABLET | Freq: Two times a day (BID) | ORAL | 0 refills | Status: AC | PRN

## 2017-12-06 MED ORDER — METOPROLOL SUCCINATE ER 50 MG PO TB24: 50.0000 mg | ORAL_TABLET | Freq: Every day | ORAL | 3 refills | Status: DC

## 2017-12-06 MED ORDER — GLUCOSE BLOOD VI STRP: 1.0000 | ORAL_STRIP | Freq: Three times a day (TID) | 11 refills | Status: DC

## 2017-12-06 MED ORDER — METFORMIN HCL 500 MG PO TABS: ORAL_TABLET | ORAL | 3 refills | Status: DC

## 2017-12-06 NOTE — Patient Instructions (Signed)
Continue current medications as ordered  Follow up with cardiology as scheduled  Will call with referrals to podiatry and dermatology  Will call with lab results  Follow up in 4 mos for CPE/AWV. Fasting labs prior to appt

## 2017-12-06 NOTE — Progress Notes (Signed)
Patient ID: Michelle Robertson, female   DOB: 10/21/1951, 67 y.o.   MRN: [id number]   Location:  [place] OFFICE  Provider: DR Arletha Grippe  Code Status:  Goals of Care:  Advanced Directives 09/14/2016  Does Patient Have a Medical Advance Directive? No  Would patient like information on creating a medical advance directive? No - patient declined information  Pre-existing out of facility DNR order (yellow form or pink MOST form) -     Chief Complaint  Patient presents with  . Medical Management of Chronic Issues    3 month follow-up, palpitations (esatblished with cardiologist), DM Foot Exam due  . Medication Management    Stopped Crestor x 8 months ago, patient states she is not fond of cholesterol medication   . Health Maintenance    Refused HIV, Colonoscopy. Eye exam completed 3 weeks ago, will call for report. Order pending for BMD   . Medication Refill    Refill all medications and test strips     HPI: Patient is a 67 y.o. female seen today for medical management of chronic diseases.  She returned from an extended stay in [place] country [place] in Nov 2018. She was there for 11 mos closing out deceased spouse's pension. She experienced palpitations while there but sx's much improved. Palpitations less frequent.   She has a painful mole on left posterior forearm. Mole has been present since birth but over the yrs it has gotten painful whenever hit against another object. No redness or bleeding. No ulceration. She never followed up with dermatology as she had to leave the country for several mos.  She has painful R>L bunion x 2 yrs. She saw podiatry and recommended sx but she declined procedure. She never followed up for further tx as she left the country for several mos.  She has 2 lower teeth that needs to be pulled and has taken abx x 2 which helped. She is followed by dentist but has not seen them in several mos due to lack of insurance  coverage  increased urinary frequency - unchanged with 6-10 x nocturia. No leakage during the day. Sx's occur at night. Sx's interrupt sleep.   HTN/palpitations - BP/HR stable on metoprolol. Occasional palpitations but much improved on BB. Followed by cardio Dr Oval Linsey. She is to have a lexiscan stress test to assess exertional dyspnea. CMP and lipid panel ordered on 12/03/17  GAD - much improved. Takes klonopin prn. Declines routine med  Hyperlipidemia - she stopped crestor 6 mos ago. She maintains healthy food choices. Followed by cardio. She has not had repeat labs yet to determine efficacy  BPPV - intermittent. Takes OTC motion sickness med which helps  DM - she takes metformin. BS fluctuate but no low BS reactions. Last A1c 6.6%; microalbumin/Cr ratio 5   Past Medical History:  Diagnosis Date  . Diabetes mellitus without complication ([place])   . Ectopic pregnancy   . Essential hypertension 01/30/2016  . History of palpitations   . PAC (premature atrial contraction) 07/28/2016  . PVC (premature ventricular contraction)    hx of PVC's and PAC's  . SOB (shortness of breath) on exertion     Past Surgical History:  Procedure Laterality Date  . TUBAL LIGATION       reports that  has never smoked. she has never used smokeless tobacco. She reports that she does not drink alcohol or use drugs. Social History   Socioeconomic History  . Marital status:  Widowed    Spouse name: Not on file  . Number of children: Not on file  . Years of education: Not on file  . Highest education level: Not on file  Social Needs  . Financial resource strain: Not on file  . Food insecurity - worry: Not on file  . Food insecurity - inability: Not on file  . Transportation needs - medical: Not on file  . Transportation needs - non-medical: Not on file  Occupational History  . Not on file  Tobacco Use  . Smoking status: Never Smoker  . Smokeless tobacco: Never Used  Substance and Sexual Activity  .  Alcohol use: No  . Drug use: No  . Sexual activity: Not on file  Other Topics Concern  . Not on file  Social History Narrative  . Not on file    Family History  Problem Relation Age of Onset  . Diabetes Mother   . Stroke Mother   . Diabetes Maternal Aunt   . Diabetes Paternal Grandmother   . Cancer Father     No Known Allergies  Outpatient Encounter Medications as of 12/06/2017  Medication Sig  . Cholecalciferol (VITAMIN D) 2000 units CAPS Take by mouth.  . clonazePAM (KLONOPIN) 0.5 MG tablet Take 1 tablet (0.5 mg total) by mouth 2 (two) times daily as needed. for anxiety  . glucose blood (BAYER CONTOUR TEST) test strip 1 each by Other route 3 (three) times daily. Use to check blood sugars three times of day Dx E11.9  . metFORMIN (GLUCOPHAGE) 500 MG tablet TAKE 1 TABLET BY MOUTH TWICE DAILY WITH A MEAL  . metoprolol succinate (TOPROL-XL) 50 MG 24 hr tablet TAKE 1 TABLET BY MOUTH DAILY  . Tetrahydrozoline HCl (VISINE OP) Place 2 drops into both eyes as needed (for dry eyes).  . [DISCONTINUED] rosuvastatin (CRESTOR) 20 MG tablet TAKE 1 TABLET BY MOUTH DAILY (Patient not taking: Reported on 12/03/2017)   No facility-administered encounter medications on file as of 12/06/2017.     Review of Systems:  Review of Systems  Cardiovascular: Positive for palpitations.  Skin:       painful mole  Neurological: Positive for dizziness.  All other systems reviewed and are negative.   Health Maintenance  Topic Date Due  . OPHTHALMOLOGY EXAM  12/21/2015  . HEMOGLOBIN A1C  03/14/2017  . URINE MICROALBUMIN  06/13/2017  . INFLUENZA VACCINE  06/19/2017  . FOOT EXAM  09/14/2017  . PAP SMEAR  05/18/2018 (Originally 07/05/2013)  . COLONOSCOPY  12/06/2018 (Originally 06/22/2003)  . HIV Screening  12/06/2018 (Originally 06/21/1968)  . MAMMOGRAM  11/13/2018  . TETANUS/TDAP  07/05/2020  . Hepatitis C Screening  Completed    Physical Exam: Vitals:   12/06/17 1052  BP: 118/66  Pulse: (!) 56   Temp: 98 F (36.7 C)  TempSrc: Oral  SpO2: 98%  Weight: 130 lb (59 kg)  Height: 5\' 3"  (1.6 m)   Body mass index is 23.03 kg/m. Physical Exam  Constitutional: She is oriented to person, place, and time. She appears well-developed and well-nourished.  HENT:  Mouth/Throat: Oropharynx is clear and moist. No oropharyngeal exudate.  MMM; no oral thrush  Eyes: Pupils are equal, round, and reactive to light. No scleral icterus.  Neck: Neck supple. Carotid bruit is not present. No tracheal deviation present. No thyromegaly present.  Cardiovascular: Regular rhythm and intact distal pulses. Bradycardia present. Exam reveals no gallop and no friction rub.  Murmur (1/6 SEM) heard. No LE edema b/l. no  calf TTP.   Pulmonary/Chest: Effort normal and breath sounds normal. No stridor. No respiratory distress. She has no wheezes. She has no rales.  Abdominal: Soft. Normal appearance and bowel sounds are normal. She exhibits no distension and no mass. There is no hepatomegaly. There is no tenderness. There is no rigidity, no rebound and no guarding. No hernia.  Musculoskeletal: She exhibits edema (small, intermediate joints) and tenderness (right 1st MTP joint).  Lymphadenopathy:    She has no cervical adenopathy.  Neurological: She is alert and oriented to person, place, and time.  Skin: Skin is warm and dry. No rash noted.     Psychiatric: She has a normal mood and affect. Her behavior is normal. Judgment and thought content normal.   Diabetic Foot Exam - Simple   Simple Foot Form Diabetic Foot exam was performed with the following findings:  Yes 12/06/2017 11:36 AM  Visual Inspection See comments:  Yes Sensation Testing Intact to touch and monofilament testing bilaterally:  Yes Pulse Check Posterior Tibialis and Dorsalis pulse intact bilaterally:  Yes Comments R>L TTP bunion; no calluses or ulcerations      Labs reviewed: Basic Metabolic Panel: No results for input(s): NA, K, CL, CO2,  GLUCOSE, BUN, CREATININE, CALCIUM, MG, PHOS, TSH in the last 8760 hours. Liver Function Tests: No results for input(s): AST, ALT, ALKPHOS, BILITOT, PROT, ALBUMIN in the last 8760 hours. No results for input(s): LIPASE, AMYLASE in the last 8760 hours. No results for input(s): AMMONIA in the last 8760 hours. CBC: No results for input(s): WBC, NEUTROABS, HGB, HCT, MCV, PLT in the last 8760 hours. Lipid Panel: No results for input(s): CHOL, HDL, LDLCALC, TRIG, CHOLHDL, LDLDIRECT in the last 8760 hours. Lab Results  Component Value Date   HGBA1C 6.6 (H) 09/13/2016    Procedures since last visit: No results found.  Assessment/Plan   ICD-10-CM   1. Controlled type 2 diabetes mellitus without complication, without long-term current use of insulin (HCC) E11.9 glucose blood (BAYER CONTOUR TEST) test strip    metFORMIN (GLUCOPHAGE) 500 MG tablet    Hemoglobin A1c    Microalbumin/Creatinine Ratio, Urine    CBC with Differential/Platelets    Ambulatory referral to Podiatry  2. GAD (generalized anxiety disorder) F41.1 clonazePAM (KLONOPIN) 0.5 MG tablet  3. BUNIONS, BILATERAL M21.619 Ambulatory referral to Podiatry   painful  4. PAC (premature atrial contraction) I49.1 metoprolol succinate (TOPROL-XL) 50 MG 24 hr tablet  5. Nevus D22.9 Ambulatory referral to Dermatology   painful and easily irritated  6. Essential hypertension I10 metoprolol succinate (TOPROL-XL) 50 MG 24 hr tablet    CBC with Differential/Platelets  7. Hyperlipidemia associated with type 2 diabetes mellitus (HCC) E11.69 TSH   E78.5   8. Estrogen deficiency E28.39 DG Bone Density     She declined flu vaccination today  Continue current medications as ordered  Follow up with cardiology as scheduled  Will call with referrals to podiatry and dermatology  Will call with lab results  Follow up in 4 mos for CPE/AWV. Fasting labs prior to appt   [place] S. Perlie Gold  [facility] [address] [place], [place] [address] [phone] Cell (Monday-Friday 8 AM - 5 PM) [phone] After 5 PM and follow prompts

## 2017-12-07 LAB — CBC WITH DIFFERENTIAL/PLATELET
BASOS PCT: 0.6 %
Basophils Absolute: 39 cells/uL (ref 0–200)
EOS PCT: 2.2 %
Eosinophils Absolute: 143 cells/uL (ref 15–500)
HEMATOCRIT: 32.9 % — AB (ref 35.0–45.0)
HEMOGLOBIN: 10.8 g/dL — AB (ref 11.7–15.5)
LYMPHS ABS: 2958 {cells}/uL (ref 850–3900)
MCH: 28.1 pg (ref 27.0–33.0)
MCHC: 32.8 g/dL (ref 32.0–36.0)
MCV: 85.5 fL (ref 80.0–100.0)
MONOS PCT: 4.9 %
MPV: 11.6 fL (ref 7.5–12.5)
Neutro Abs: 3042 cells/uL (ref 1500–7800)
Neutrophils Relative %: 46.8 %
Platelets: 285 10*3/uL (ref 140–400)
RBC: 3.85 10*6/uL (ref 3.80–5.10)
RDW: 13.2 % (ref 11.0–15.0)
Total Lymphocyte: 45.5 %
WBC mixed population: 319 cells/uL (ref 200–950)
WBC: 6.5 10*3/uL (ref 3.8–10.8)

## 2017-12-07 LAB — MICROALBUMIN / CREATININE URINE RATIO
CREATININE, URINE: 178 mg/dL (ref 20–275)
MICROALB UR: 0.7 mg/dL
Microalb Creat Ratio: 4 mcg/mg creat (ref <30)

## 2017-12-07 LAB — HEMOGLOBIN A1C
EAG (MMOL/L): 8.2 (calc)
HEMOGLOBIN A1C: 6.8 %{Hb} — AB (ref <5.7)
MEAN PLASMA GLUCOSE: 148 (calc)

## 2017-12-07 LAB — TSH: TSH: 2.16 m[IU]/L (ref 0.40–4.50)

## 2017-12-10 ENCOUNTER — Telehealth: Payer: Self-pay

## 2017-12-10 NOTE — Telephone Encounter (Signed)
Message left on clinical intake voicemail:   Patient called requesting lab results  I returned patient's call and discussed labs from 12/06/17 (see results), patient verbalized understanding of results. Patient will await mailed copy.  I also gave patient the number to mychart [phone] to reset forgotten password

## 2017-12-12 ENCOUNTER — Other Ambulatory Visit: Payer: Self-pay | Admitting: Internal Medicine

## 2017-12-18 ENCOUNTER — Telehealth (HOSPITAL_COMMUNITY): Payer: Self-pay

## 2017-12-18 NOTE — Telephone Encounter (Signed)
Encounter complete. 

## 2017-12-20 DIAGNOSIS — I6523 Occlusion and stenosis of bilateral carotid arteries: Secondary | ICD-10-CM

## 2017-12-20 HISTORY — DX: Occlusion and stenosis of bilateral carotid arteries: I65.23

## 2017-12-24 ENCOUNTER — Ambulatory Visit (HOSPITAL_COMMUNITY)
Admission: RE | Admit: 2017-12-24 | Discharge: 2017-12-24 | Disposition: A | Discharge status: Home / Self Care | Payer: BLUE CROSS/BLUE SHIELD | Source: Ambulatory Visit | Attending: Cardiology | Admitting: Cardiology

## 2017-12-24 ENCOUNTER — Other Ambulatory Visit: Payer: Self-pay | Admitting: Cardiovascular Disease

## 2017-12-24 ENCOUNTER — Encounter (HOSPITAL_COMMUNITY): Payer: Self-pay

## 2017-12-24 DIAGNOSIS — R0602 Shortness of breath: Secondary | ICD-10-CM | POA: Insufficient documentation

## 2017-12-24 DIAGNOSIS — I10 Essential (primary) hypertension: Secondary | ICD-10-CM | POA: Insufficient documentation

## 2017-12-24 DIAGNOSIS — H9319 Tinnitus, unspecified ear: Secondary | ICD-10-CM

## 2017-12-24 DIAGNOSIS — I6523 Occlusion and stenosis of bilateral carotid arteries: Secondary | ICD-10-CM

## 2017-12-24 DIAGNOSIS — I493 Ventricular premature depolarization: Secondary | ICD-10-CM | POA: Insufficient documentation

## 2017-12-24 DIAGNOSIS — E119 Type 2 diabetes mellitus without complications: Secondary | ICD-10-CM | POA: Insufficient documentation

## 2017-12-24 DIAGNOSIS — R002 Palpitations: Secondary | ICD-10-CM | POA: Diagnosis not present

## 2017-12-24 LAB — MYOCARDIAL PERFUSION IMAGING
CHL CUP NUCLEAR SDS: 1
CHL CUP RESTING HR STRESS: 56 {beats}/min
LV dias vol: 83 mL (ref 46–106)
LV sys vol: 37 mL
NUC STRESS TID: 1.15
Peak HR: 106 {beats}/min
SRS: 2
SSS: 3

## 2017-12-24 MED ORDER — TECHNETIUM TC 99M TETROFOSMIN IV KIT
32.4000 | PACK | Freq: Once | INTRAVENOUS | Status: AC | PRN
Start: 2017-12-24 — End: 2017-12-24
  Administered 2017-12-24: 32.4 via INTRAVENOUS
  Filled 2017-12-24: qty 33

## 2017-12-24 MED ORDER — REGADENOSON 0.4 MG/5ML IV SOLN
0.4000 mg | Freq: Once | INTRAVENOUS | Status: AC
  Administered 2017-12-24: 0.4 mg via INTRAVENOUS

## 2017-12-24 MED ORDER — TECHNETIUM TC 99M TETROFOSMIN IV KIT
9.9000 | PACK | Freq: Once | INTRAVENOUS | Status: AC | PRN
  Administered 2017-12-24: 9.9 via INTRAVENOUS
  Filled 2017-12-24: qty 10

## 2017-12-31 ENCOUNTER — Ambulatory Visit
Admission: RE | Admit: 2017-12-31 | Discharge: 2017-12-31 | Disposition: A | Discharge status: Home / Self Care | Payer: BLUE CROSS/BLUE SHIELD | Source: Ambulatory Visit | Attending: Internal Medicine | Admitting: Internal Medicine

## 2017-12-31 DIAGNOSIS — E2839 Other primary ovarian failure: Secondary | ICD-10-CM

## 2018-01-01 ENCOUNTER — Ambulatory Visit (HOSPITAL_COMMUNITY)
Admission: RE | Admit: 2018-01-01 | Discharge: 2018-01-01 | Disposition: A | Discharge status: Home / Self Care | Payer: BLUE CROSS/BLUE SHIELD | Source: Ambulatory Visit | Attending: Cardiovascular Disease | Admitting: Cardiovascular Disease

## 2018-01-01 DIAGNOSIS — I6523 Occlusion and stenosis of bilateral carotid arteries: Secondary | ICD-10-CM

## 2018-01-01 DIAGNOSIS — H9319 Tinnitus, unspecified ear: Secondary | ICD-10-CM | POA: Insufficient documentation

## 2018-01-15 ENCOUNTER — Telehealth: Payer: Self-pay | Admitting: Cardiovascular Disease

## 2018-01-15 DIAGNOSIS — I6523 Occlusion and stenosis of bilateral carotid arteries: Secondary | ICD-10-CM

## 2018-01-15 NOTE — Telephone Encounter (Signed)
Advised patient of results.  

## 2018-01-15 NOTE — Telephone Encounter (Signed)
New message     Patient calling for results:Michelle Robertson

## 2018-01-15 NOTE — Telephone Encounter (Signed)
-----   Message from Skeet Latch, MD sent at 01/13/2018  9:54 AM EST ----- Moderate blockage in both carotid arteries.  Repeat ultrasound in 1 year.  Please start aspirin 81mg  and return for lipids/CMP as ordered.

## 2018-01-31 ENCOUNTER — Ambulatory Visit: Payer: BLUE CROSS/BLUE SHIELD | Admitting: Cardiovascular Disease

## 2018-02-19 LAB — COMPREHENSIVE METABOLIC PANEL
ALBUMIN: 4.6 g/dL (ref 3.6–4.8)
ALT: 10 IU/L (ref 0–32)
AST: 15 IU/L (ref 0–40)
Albumin/Globulin Ratio: 1.6 (ref 1.2–2.2)
Alkaline Phosphatase: 62 IU/L (ref 39–117)
BUN / CREAT RATIO: 18 (ref 12–28)
BUN: 17 mg/dL (ref 8–27)
Bilirubin Total: 0.3 mg/dL (ref 0.0–1.2)
CALCIUM: 9.4 mg/dL (ref 8.7–10.3)
CO2: 23 mmol/L (ref 20–29)
CREATININE: 0.97 mg/dL (ref 0.57–1.00)
Chloride: 105 mmol/L (ref 96–106)
GFR calc Af Amer: 71 mL/min/{1.73_m2} (ref >59)
GFR, EST NON AFRICAN AMERICAN: 62 mL/min/{1.73_m2} (ref >59)
Globulin, Total: 2.8 g/dL (ref 1.5–4.5)
Glucose: 107 mg/dL — ABNORMAL HIGH (ref 65–99)
Potassium: 4.7 mmol/L (ref 3.5–5.2)
SODIUM: 142 mmol/L (ref 134–144)
TOTAL PROTEIN: 7.4 g/dL (ref 6.0–8.5)

## 2018-02-19 LAB — LIPID PANEL
CHOLESTEROL TOTAL: 245 mg/dL — AB (ref 100–199)
Chol/HDL Ratio: 4.7 ratio — ABNORMAL HIGH (ref 0.0–4.4)
HDL: 52 mg/dL (ref >39)
LDL Calculated: 157 mg/dL — ABNORMAL HIGH (ref 0–99)
TRIGLYCERIDES: 179 mg/dL — AB (ref 0–149)
VLDL Cholesterol Cal: 36 mg/dL (ref 5–40)

## 2018-02-21 ENCOUNTER — Ambulatory Visit (INDEPENDENT_AMBULATORY_CARE_PROVIDER_SITE_OTHER): Payer: BLUE CROSS/BLUE SHIELD | Admitting: Physician Assistant

## 2018-02-21 ENCOUNTER — Encounter: Payer: Self-pay | Admitting: Physician Assistant

## 2018-02-21 VITALS — BP 128/62 | HR 75 | Ht 63.0 in | Wt 130.2 lb

## 2018-02-21 DIAGNOSIS — I10 Essential (primary) hypertension: Secondary | ICD-10-CM

## 2018-02-21 DIAGNOSIS — E785 Hyperlipidemia, unspecified: Secondary | ICD-10-CM | POA: Diagnosis not present

## 2018-02-21 DIAGNOSIS — R0602 Shortness of breath: Secondary | ICD-10-CM

## 2018-02-21 DIAGNOSIS — I6523 Occlusion and stenosis of bilateral carotid arteries: Secondary | ICD-10-CM | POA: Diagnosis not present

## 2018-02-21 MED ORDER — ASPIRIN EC 81 MG PO TBEC: 81.0000 mg | DELAYED_RELEASE_TABLET | Freq: Every day | ORAL | 3 refills | Status: DC

## 2018-02-21 MED ORDER — METOPROLOL SUCCINATE ER 25 MG PO TB24: 25.0000 mg | ORAL_TABLET | Freq: Every day | ORAL | 3 refills | Status: DC

## 2018-02-21 MED ORDER — ATORVASTATIN CALCIUM 20 MG PO TABS: 20.0000 mg | ORAL_TABLET | Freq: Every day | ORAL | 6 refills | Status: DC

## 2018-02-21 NOTE — Patient Instructions (Addendum)
Medication Instructions:  START Lipitor 20mg  take 1 tablet once a day TAKE Aspirin 81mg  once a day TAKE  1-2 tablets DAILY of  Metoprolol if blood pressure is 130/80  Labwork: Your physician recommends that you return for lab work in: 3 months-FASTING-LIPID, CMET YOUR GOAL LDL IS <70 YOUR CURRENT LEVEL IS 157  Testing/Procedures: NONE   Follow-Up: Your physician recommends that you return for lab work in: [place]  Any Other Special Instructions Will Be Listed Below (If Applicable).  If you need a refill on your cardiac medications before your next appointment, please call your pharmacy.

## 2018-02-21 NOTE — Progress Notes (Signed)
Cardiology Office Note   Date:  02/21/2018   ID:  Michelle Robertson, DOB 1951-11-10, MRN [id number]  PCP:  Gildardo Cranker, DO  Cardiologist: Dr. Oval Linsey, 12/03/2017 Rosaria Ferries, PA-C   Chief Complaint  Patient presents with  . Follow-up    History of Present Illness: Michelle Robertson is a 67 y.o. female with a history of DM, PACs, PVCs, HLD, DOE, nl EF on echo 2014.   12/03/2017 office visit, compliance with daily metoprolol emphasized, Myoview ordered  Michelle Robertson presents for cardiology follow up.  She is taking the metoprolol at times. She took it twice in March. She has not had it today.  She has hardly had any palpitations for 2 months. She is doing much better with this. She follows her BP at home and does not take the med if her BP is less than 130/80. When she takes the med, she takes 50 mg or only 25 mg if SBP is in the 140s.   She is not currently taking ASA.   She hears a rushing noise in her head at times, thinks it is the carotid bruits.   She is not getting chest pain or shortness of breath with exertion.  She is not having any problems with being lightheaded or dizzy or near syncope.   Past Medical History:  Diagnosis Date  . Diabetes mellitus without complication ([place])   . Ectopic pregnancy   . Essential hypertension 01/30/2016  . History of palpitations   . PAC (premature atrial contraction) 07/28/2016  . PVC (premature ventricular contraction)    hx of PVC's and PAC's  . SOB (shortness of breath) on exertion     Past Surgical History:  Procedure Laterality Date  . TUBAL LIGATION      Current Outpatient Medications  Medication Sig Dispense Refill  . Cholecalciferol (VITAMIN D) 2000 units CAPS Take by mouth.    . clonazePAM (KLONOPIN) 0.5 MG tablet Take 1 tablet (0.5 mg total) by mouth 2 (two) times daily as needed. for anxiety 60 tablet 0  . glucose blood (BAYER CONTOUR TEST) test strip 1 each by Other route 3 (three) times daily. Use to check  blood sugars three times of day Dx E11.9 100 each 11  . metFORMIN (GLUCOPHAGE) 500 MG tablet TAKE 1 TABLET BY MOUTH TWICE DAILY WITH A MEAL for diabetes 180 tablet 3  . metoprolol succinate (TOPROL-XL) 25 MG 24 hr tablet TAKE 1 TABLET BY MOUTH DAILY 240 tablet 1  . Tetrahydrozoline HCl (VISINE OP) Place 2 drops into both eyes as needed (for dry eyes).    . TURMERIC PO Take by mouth.     No current facility-administered medications for this visit.     Allergies:   Patient has no known allergies.    Social History:  The patient  reports that she has never smoked. She has never used smokeless tobacco. She reports that she does not drink alcohol or use drugs.   Family History:  The patient's family history includes Cancer in her father; Diabetes in her maternal aunt, mother, and paternal grandmother; Stroke in her mother.    ROS:  Please see the history of present illness. All other systems are reviewed and negative.    PHYSICAL EXAM: VS:  BP 128/62 (BP Location: Left Arm, Patient Position: Sitting, Cuff Size: Normal)   Pulse 75   Ht 5\' 3"  (1.6 m)   Wt 130 lb 3.2 oz (59.1 kg)   SpO2 97%   BMI 23.06 kg/m  ,  BMI Body mass index is 23.06 kg/m. GEN: Well nourished, well developed, female in no acute distress  HEENT: normal for age  Neck: no JVD, bilateral carotid bruits, no masses Cardiac: RRR; soft murmur, no rubs, or gallops Respiratory:  clear to auscultation bilaterally, normal work of breathing GI: soft, nontender, nondistended, + BS MS: no deformity or atrophy; no edema; distal pulses are 2+ in all 4 extremities   Skin: warm and dry, no rash Neuro:  Strength and sensation are intact Psych: euthymic mood, full affect   EKG:  EKG is not ordered today.  MYOVIEW: 12/24/2017  The left ventricular ejection fraction is normal (55-65%).  Nuclear stress EF: 55%.  There was no ST segment deviation noted during stress.  The study is normal.  This is a low risk study. This  study is affected by artifacts - extracardiac activity on the stress images and overall lower counts. No obvious infarct or ischemia is seen.  There are also frequent PVCs during stress. Consider coronary CTA for further evaluation. Normal LVEF.   Recent Labs: 12/06/2017: Hemoglobin 10.8; Platelets 285; TSH 2.16 02/19/2018: ALT 10; BUN 17; Creatinine, Ser 0.97; Potassium 4.7; Sodium 142    Lipid Panel    Component Value Date/Time   CHOL 245 (H) 02/19/2018 1030   TRIG 179 (H) 02/19/2018 1030   HDL 52 02/19/2018 1030   CHOLHDL 4.7 (H) 02/19/2018 1030   CHOLHDL 3.3 09/13/2016 0817   VLDL 20 09/13/2016 0817   LDLCALC 157 (H) 02/19/2018 1030   LDLDIRECT 134.0 07/19/2015 1541     Wt Readings from Last 3 Encounters:  02/21/18 130 lb 3.2 oz (59.1 kg)  12/24/17 130 lb (59 kg)  12/06/17 130 lb (59 kg)     Other studies Reviewed: Additional studies/ records that were reviewed today include: Office notes and testing.  ASSESSMENT AND PLAN:  1.  Dyspnea on exertion: She is not having any dyspnea now.  Her Myoview was normal and she is reassured by this.  2.  Carotid bruits: I reviewed the results of the carotid Dopplers with her.  I explained that because she is starting to build plaque in those arteries, she needs to take the aspirin and she needs to make sure her cholesterol is well controlled.  3.  Hyperlipidemia: I explained that her goal LDL is less than 70.  I explained that over-the-counter medications were not going to get her there.  For her long-term health, she needs to start on a statin but I agreed to start at a low dose.  Start Lipitor 20 mg daily and see how she tolerates it.  Recheck her labs in 3 months.  4.  Hypertension: It would be difficult to get her to take a daily medication.  I explained that her blood pressure goal is 130/80.  She is strongly encouraged to take her metoprolol either 25 or 50 mg, if her blood pressure is above that range in the morning.  She  agrees   Current medicines are reviewed at length with the patient today.  The patient has concerns regarding medicines.  Concerns were addressed  The following changes have been made: Add Lipitor 20 mg daily  Labs/ tests ordered today include:   Orders Placed This Encounter  Procedures  . Lipid panel  . Comprehensive Metabolic Panel (CMET)     Disposition:   FU with Dr. Oval Linsey  Signed, Rosaria Ferries, PA-C  02/21/2018 1:19 PM    [place] Phone: [phone];  Fax: [phone]  This note was written with the assistance of speech recognition software. Please excuse any transcriptional errors.

## 2018-04-04 ENCOUNTER — Ambulatory Visit: Payer: BLUE CROSS/BLUE SHIELD

## 2018-04-08 ENCOUNTER — Encounter: Payer: BLUE CROSS/BLUE SHIELD | Admitting: Internal Medicine

## 2018-05-16 ENCOUNTER — Encounter: Payer: Self-pay | Admitting: Internal Medicine

## 2018-06-03 ENCOUNTER — Ambulatory Visit: Payer: BLUE CROSS/BLUE SHIELD | Admitting: Cardiovascular Disease

## 2018-07-09 ENCOUNTER — Encounter: Payer: Self-pay | Admitting: Internal Medicine

## 2018-07-14 ENCOUNTER — Ambulatory Visit: Payer: BLUE CROSS/BLUE SHIELD | Admitting: Cardiovascular Disease

## 2018-08-01 ENCOUNTER — Ambulatory Visit (INDEPENDENT_AMBULATORY_CARE_PROVIDER_SITE_OTHER): Payer: Medicare Other | Admitting: Internal Medicine

## 2018-08-01 ENCOUNTER — Encounter: Payer: Self-pay | Admitting: Internal Medicine

## 2018-08-01 VITALS — BP 132/70 | HR 55 | Temp 98.5°F | Ht 63.0 in | Wt 132.0 lb

## 2018-08-01 DIAGNOSIS — E1159 Type 2 diabetes mellitus with other circulatory complications: Secondary | ICD-10-CM | POA: Diagnosis not present

## 2018-08-01 DIAGNOSIS — E785 Hyperlipidemia, unspecified: Secondary | ICD-10-CM | POA: Diagnosis not present

## 2018-08-01 DIAGNOSIS — D649 Anemia, unspecified: Secondary | ICD-10-CM

## 2018-08-01 DIAGNOSIS — R5383 Other fatigue: Secondary | ICD-10-CM | POA: Diagnosis not present

## 2018-08-01 DIAGNOSIS — I10 Essential (primary) hypertension: Secondary | ICD-10-CM

## 2018-08-01 DIAGNOSIS — E119 Type 2 diabetes mellitus without complications: Secondary | ICD-10-CM

## 2018-08-01 DIAGNOSIS — Z0000 Encounter for general adult medical examination without abnormal findings: Secondary | ICD-10-CM | POA: Diagnosis not present

## 2018-08-01 DIAGNOSIS — I152 Hypertension secondary to endocrine disorders: Secondary | ICD-10-CM

## 2018-08-01 DIAGNOSIS — F411 Generalized anxiety disorder: Secondary | ICD-10-CM

## 2018-08-01 DIAGNOSIS — E1169 Type 2 diabetes mellitus with other specified complication: Secondary | ICD-10-CM

## 2018-08-01 DIAGNOSIS — M21619 Bunion of unspecified foot: Secondary | ICD-10-CM

## 2018-08-01 NOTE — Patient Instructions (Addendum)
Continue current medications as ordered  Will call with lab results  Follow up with specialists as scheduled  Follow up in 6 mos with Michelle Robertson for routine visit.  Keeping You Healthy  Get These Tests  Blood Pressure- Have your blood pressure checked by your healthcare provider at least once a year.  Normal blood pressure is 120/80.  Weight- Have your body mass index (BMI) calculated to screen for obesity.  BMI is a measure of body fat based on height and weight.  You can calculate your own BMI at [employer]  Cholesterol- Have your cholesterol checked every year.  Diabetes- Have your blood sugar checked every year if you have high blood pressure, high cholesterol, a family history of diabetes or if you are overweight.  Pap Test - Have a pap test every 1 to 5 years if you have been sexually active.  If you are older than 65 and recent pap tests have been normal you may not need additional pap tests.  In addition, if you have had a hysterectomy  for benign disease additional pap tests are not necessary.  Mammogram-Yearly mammograms are essential for early detection of breast cancer  Screening for Colon Cancer- Colonoscopy starting at age 66. Screening may begin sooner depending on your family history and other health conditions.  Follow up colonoscopy as directed by your Gastroenterologist.  Screening for Osteoporosis- Screening begins at age 51 with bone density scanning, sooner if you are at higher risk for developing Osteoporosis.  Get these medicines  Calcium with Vitamin D- Your body requires 1200-1500 mg of Calcium a day and [id number] IU of Vitamin D a day.  You can only absorb 500 mg of Calcium at a time therefore Calcium must be taken in 2 or 3 separate doses throughout the day.  Hormones- Hormone therapy has been associated with increased risk for certain cancers and heart disease.  Talk to your healthcare provider about if you need relief from menopausal  symptoms.  Aspirin- Ask your healthcare provider about taking Aspirin to prevent Heart Disease and Stroke.  Get these Immuniztions  Flu shot- Every fall  Pneumonia shot- Once after the age of 26; if you are younger ask your healthcare provider if you need a pneumonia shot.  Tetanus- Every ten years.  Zostavax- Once after the age of 28 to prevent shingles.  Take these steps  Don't smoke- Your healthcare provider can help you quit. For tips on how to quit, ask your healthcare provider or go to www.smokefree.gov or call 1-800 QUIT-NOW.  Be physically active- Exercise 5 days a week for a minimum of 30 minutes.  If you are not already physically active, start slow and gradually work up to 30 minutes of moderate physical activity.  Try walking, dancing, bike riding, swimming, etc.  Eat a healthy diet- Eat a variety of healthy foods such as fruits, vegetables, whole grains, low fat milk, low fat cheeses, yogurt, lean meats, chicken, fish, eggs, dried beans, tofu, etc.  For more information go to www.thenutritionsource.org  Dental visit- Brush and floss teeth twice daily; visit your dentist twice a year.  Eye exam- Visit your Optometrist or Ophthalmologist yearly.  Drink alcohol in moderation- Limit alcohol intake to one drink or less a day.  Never drink and drive.  Depression- Your emotional health is as important as your physical health.  If you're feeling down or losing interest in things you normally enjoy, please talk to your healthcare provider.  Seat Belts- can save your life; always  wear one  Smoke/Carbon Monoxide detectors- These detectors need to be installed on the appropriate level of your home.  Replace batteries at least once a year.  Violence- If anyone is threatening or hurting you, please tell your healthcare provider.  Living Will/ Health care power of attorney- Discuss with your healthcare provider and family.

## 2018-08-01 NOTE — Progress Notes (Signed)
Patient ID: Michelle Robertson, female   DOB: September 04, 1951, 67 y.o.   MRN: [id number]   Location:  [facility]  Place of Service:  OFFICE  Provider: Arletha Grippe, DO  Patient Care Team: Gildardo Cranker, DO as PCP - General (Internal Medicine) Skeet Latch, MD as PCP - Cardiology (Cardiology)  Extended Emergency Contact Information Primary Emergency Contact: Selinda Flavin Address: [address]          [place], [place] [address] Johnnette Litter of [place] Phone: [phone] Mobile Phone: [phone] Relation: Daughter  Code Status:  Goals of Care: Advanced Directive information Advanced Directives 09/14/2016  Does Patient Have a Medical Advance Directive? No  Would patient like information on creating a medical advance directive? No - patient declined information  Pre-existing out of facility DNR order (yellow form or pink MOST form) -     Chief Complaint  Patient presents with  . Annual Exam    CPE    HPI: Patient is a 67 y.o. female seen in today for an annual wellness exam.  She feels tired and has intermittent nausea x 1 month. CBG 148 fasting today and usually 140-160s during the day. No low BS reactions. No numbness/tingling. No diarrhea.   She has painful R>L bunion x 2 yrs. She saw podiatry and recommended sx but she declined procedure. She never followed up for further tx as she left the country for [place] mos.  increased urinary frequency - unchanged with 6-10 x nocturia. No leakage during the day. Sx's occur at night. Sx's interrupt sleep.   HTN/palpitations - BP/HR stable on metoprolol. Occasional palpitations but much improved on BB. Followed by cardio Dr Oval Linsey. lexiscan stress test completed in Feb 2019 showed no acute abnormalities and nml EF  GAD - sable; Takes klonopin prn. Declines routine med  Hyperlipidemia - not at goal; takes lipitor. She maintains healthy food choices. Followed by cardio. Tchol 245; TG 179; HDL 52; LDL 157  BPPV - intermittent.  Takes OTC motion sickness med which helps  DM - she takes metformin. BS fluctuate but no low BS reactions. Last A1c 6.8%; microalbumin/Cr ratio 5   Depression screen [facility] 2/9 08/01/2018 12/06/2017 09/14/2016 06/13/2016  Decreased Interest 0 0 0 0  Down, Depressed, Hopeless 0 0 0 0  PHQ - 2 Score 0 0 0 0    Fall Risk  08/01/2018 12/06/2017 09/14/2016 06/13/2016  Falls in the past year? No No Yes Yes  Number falls in past yr: - - 1 1  Injury with Fall? - - No Yes   MMSE - Mini Mental State Exam 09/14/2016  Orientation to time 4  Orientation to Place 5  Registration 3  Attention/ Calculation 5  Recall 3  Language- name 2 objects 2  Language- repeat 1  Language- follow 3 step command 3  Language- read & follow direction 1  Write a sentence 1  Copy design 1  Total score 29     Health Maintenance  Topic Date Due  . PAP SMEAR  07/05/2013  . OPHTHALMOLOGY EXAM  12/21/2015  . HEMOGLOBIN A1C  06/05/2018  . COLONOSCOPY  12/06/2018 (Originally 06/22/2003)  . HIV Screening  12/06/2018 (Originally 06/21/1968)  . INFLUENZA VACCINE  03/21/2019 (Originally 06/19/2018)  . PNA vac Low Risk Adult (1 of 2 - PCV13) 08/02/2019 (Originally 06/21/2018)  . MAMMOGRAM  11/13/2018  . FOOT EXAM  12/06/2018  . URINE MICROALBUMIN  12/06/2018  . TETANUS/TDAP  07/05/2020  . DEXA SCAN  Completed  . Hepatitis C  Screening  Completed    Urinary incontinence? No issues but continues to have urinary frequency  Functional Status Survey: Is the patient deaf or have difficulty hearing?: No Does the patient have difficulty seeing, even when wearing glasses/contacts?: No Does the patient have difficulty concentrating, remembering, or making decisions?: Yes Does the patient have difficulty walking or climbing stairs?: No Does the patient have difficulty dressing or bathing?: No Does the patient have difficulty doing errands alone such as visiting a doctor's office or shopping?: No  Exercise? No regular routine but  goes up/down stairs at home holding 4 mo old granddaughter  Diet? Maintains healthy food choices most days  No exam data present  Hearing: no issues    Dentition: she had multiple teeth pulled while in [place] > 1 yr ago; she has dentures now upper/lower  Pain: no issues  Past Medical History:  Diagnosis Date  . Diabetes mellitus without complication ([place])   . Ectopic pregnancy   . Essential hypertension 01/30/2016  . History of palpitations   . PAC (premature atrial contraction) 07/28/2016  . PVC (premature ventricular contraction)    hx of PVC's and PAC's  . SOB (shortness of breath) on exertion     Past Surgical History:  Procedure Laterality Date  . TUBAL LIGATION      Family History  Problem Relation Age of Onset  . Diabetes Mother   . Stroke Mother   . Diabetes Maternal Aunt   . Diabetes Paternal Grandmother   . Cancer Father    Family Status  Relation Name Status  . Mother  (Not Specified)  . Mat Aunt  (Not Specified)  . PGM  (Not Specified)  . Father  (Not Specified)    Social History   Socioeconomic History  . Marital status: Widowed    Spouse name: Not on file  . Number of children: Not on file  . Years of education: Not on file  . Highest education level: Not on file  Occupational History  . Not on file  Social Needs  . Financial resource strain: Not on file  . Food insecurity:    Worry: Not on file    Inability: Not on file  . Transportation needs:    Medical: Not on file    Non-medical: Not on file  Tobacco Use  . Smoking status: Never Smoker  . Smokeless tobacco: Never Used  Substance and Sexual Activity  . Alcohol use: No  . Drug use: No  . Sexual activity: Not on file  Lifestyle  . Physical activity:    Days per week: Not on file    Minutes per session: Not on file  . Stress: Not on file  Relationships  . Social connections:    Talks on phone: Not on file    Gets together: Not on file    Attends religious service: Not on file      Active member of club or organization: Not on file    Attends meetings of clubs or organizations: Not on file    Relationship status: Not on file  . Intimate partner violence:    Fear of current or ex partner: Not on file    Emotionally abused: Not on file    Physically abused: Not on file    Forced sexual activity: Not on file  Other Topics Concern  . Not on file  Social History Narrative  . Not on file    No Known Allergies  Allergies as of 08/01/2018  No Known Allergies     Medication List        Accurate as of 08/01/18  1:14 PM. Always use your most recent med list.          atorvastatin 20 MG tablet Commonly known as:  LIPITOR Take 1 tablet (20 mg total) by mouth daily.   clonazePAM 0.5 MG tablet Commonly known as:  KLONOPIN Take 1 tablet (0.5 mg total) by mouth 2 (two) times daily as needed. for anxiety   glucose blood test strip 1 each by Other route 3 (three) times daily. Use to check blood sugars three times of day Dx E11.9   metFORMIN 500 MG tablet Commonly known as:  GLUCOPHAGE TAKE 1 TABLET BY MOUTH TWICE DAILY WITH A MEAL for diabetes   metoprolol succinate 25 MG 24 hr tablet Commonly known as:  TOPROL-XL Take 1 tablet (25 mg total) by mouth daily.   TURMERIC PO Take by mouth.   VISINE OP Place 2 drops into both eyes as needed (for dry eyes).   Vitamin D 2000 units Caps Take by mouth.        Review of Systems:  Review of Systems  Constitutional: Positive for fatigue.  Gastrointestinal: Positive for nausea (x 1 month; she is more active with her 23 mos old granddaughter).  All other systems reviewed and are negative.   Physical Exam: Vitals:   08/01/18 1240  BP: 132/70  Pulse: (!) 55  Temp: 98.5 F (36.9 C)  TempSrc: Oral  SpO2: 98%  Weight: 132 lb (59.9 kg)  Height: '5\' 3"'  (1.6 m)   Body mass index is 23.38 kg/m. Physical Exam  Constitutional: She is oriented to person, place, and time. She appears well-developed and  well-nourished. No distress.  HENT:  Head: Normocephalic and atraumatic.  Right Ear: Hearing, tympanic membrane, external ear and ear canal normal.  Left Ear: Hearing, tympanic membrane, external ear and ear canal normal.  Mouth/Throat: Uvula is midline, oropharynx is clear and moist and mucous membranes are normal. She does not have dentures.  Eyes: Pupils are equal, round, and reactive to light. Conjunctivae, EOM and lids are normal. No scleral icterus.  Neck: Trachea normal and normal range of motion. Neck supple. Carotid bruit is not present. No thyroid mass and no thyromegaly present.  Cardiovascular: Normal rate, regular rhythm and intact distal pulses. Exam reveals no gallop and no friction rub.  Murmur (1/6 SEM) heard. No LE edema b/l. No calf TTP.   Pulmonary/Chest: Effort normal and breath sounds normal. She has no wheezes. She has no rhonchi. She has no rales. She exhibits no mass. Right breast exhibits no inverted nipple, no mass, no nipple discharge, no skin change and no tenderness. Left breast exhibits no inverted nipple, no mass, no nipple discharge, no skin change and no tenderness. Breasts are symmetrical.  Abdominal: Soft. Normal appearance, normal aorta and bowel sounds are normal. She exhibits no pulsatile midline mass and no mass. There is no hepatosplenomegaly. There is no tenderness. There is no rigidity, no rebound and no guarding. No hernia.  Musculoskeletal: Normal range of motion. She exhibits edema (small joints). She exhibits no deformity.  Lymphadenopathy:       Head (right side): No posterior auricular adenopathy present.       Head (left side): No posterior auricular adenopathy present.    She has no cervical adenopathy.       Right: No supraclavicular adenopathy present.       Left: No supraclavicular adenopathy  present.  Neurological: She is alert and oriented to person, place, and time. She has normal strength and normal reflexes. No cranial nerve deficit. Gait  normal.  Skin: Skin is warm, dry and intact. No rash noted. Nails show no clubbing.  Psychiatric: She has a normal mood and affect. Her speech is normal and behavior is normal. Thought content normal. Cognition and memory are normal.   Diabetic Foot Exam - Simple   Simple Foot Form Diabetic Foot exam was performed with the following findings:  Yes 08/01/2018  1:42 PM  Visual Inspection See comments:  Yes Sensation Testing Intact to touch and monofilament testing bilaterally:  Yes Pulse Check Posterior Tibialis and Dorsalis pulse intact bilaterally:  Yes Comments Large R>L bunion TTP and red but no increased warmth to touch     Labs reviewed:  Basic Metabolic Panel: Recent Labs    12/06/17 1139 02/19/18 1030  NA  --  142  K  --  4.7  CL  --  105  CO2  --  23  GLUCOSE  --  107*  BUN  --  17  CREATININE  --  0.97  CALCIUM  --  9.4  TSH 2.16  --    Liver Function Tests: Recent Labs    02/19/18 1030  AST 15  ALT 10  ALKPHOS 62  BILITOT 0.3  PROT 7.4  ALBUMIN 4.6   No results for input(s): LIPASE, AMYLASE in the last 8760 hours. No results for input(s): AMMONIA in the last 8760 hours. CBC: Recent Labs    12/06/17 1139  WBC 6.5  NEUTROABS 3,042  HGB 10.8*  HCT 32.9*  MCV 85.5  PLT 285   Lipid Panel: Recent Labs    02/19/18 1030  CHOL 245*  HDL 52  LDLCALC 157*  TRIG 179*  CHOLHDL 4.7*   Lab Results  Component Value Date   HGBA1C 6.8 (H) 12/06/2017    Procedures: No results found.  Assessment/Plan   ICD-10-CM   1. Medicare annual wellness visit, subsequent Z00.00   2. Fatigue, unspecified type R53.83   3. Controlled type 2 diabetes mellitus without complication, without long-term current use of insulin (HCC) E11.9 Urinalysis with Reflex Microscopic    CMP with eGFR([facility])    Hemoglobin A1c  4. Hypertension associated with diabetes ([place]) E11.59 CBC with Differential/Platelets   I10   5. Hyperlipidemia associated with type 2 diabetes mellitus  (HCC) E11.69 Lipid Panel   E78.5   6. GAD (generalized anxiety disorder) F41.1   7. BUNIONS, BILATERAL M21.619   8. 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  [place]. Perlie Gold  [facility] [address] [place], [place] [address] [phone] Cell (Monday-Friday 8 AM - 5 PM) [phone] After 5 PM and follow prompts

## 2018-08-02 LAB — URINALYSIS, ROUTINE W REFLEX MICROSCOPIC
BILIRUBIN URINE: NEGATIVE
Glucose, UA: NEGATIVE
Hgb urine dipstick: NEGATIVE
Ketones, ur: NEGATIVE
LEUKOCYTES UA: NEGATIVE
Nitrite: NEGATIVE
Protein, ur: NEGATIVE
Specific Gravity, Urine: 1.023 (ref 1.001–1.03)
pH: <=5 (ref 5.0–8.0)

## 2018-08-02 LAB — LIPID PANEL
CHOLESTEROL: 251 mg/dL — AB (ref <200)
HDL: 45 mg/dL — AB (ref >50)
LDL CHOLESTEROL (CALC): 168 mg/dL — AB
Non-HDL Cholesterol (Calc): 206 mg/dL (calc) — ABNORMAL HIGH (ref <130)
TRIGLYCERIDES: 213 mg/dL — AB (ref <150)
Total CHOL/HDL Ratio: 5.6 (calc) — ABNORMAL HIGH (ref <5.0)

## 2018-08-02 LAB — COMPLETE METABOLIC PANEL WITH GFR
AG Ratio: 1.5 (calc) (ref 1.0–2.5)
ALT: 10 U/L (ref 6–29)
AST: 17 U/L (ref 10–35)
Albumin: 4.4 g/dL (ref 3.6–5.1)
Alkaline phosphatase (APISO): 57 U/L (ref 33–130)
BILIRUBIN TOTAL: 0.3 mg/dL (ref 0.2–1.2)
BUN/Creatinine Ratio: 14 (calc) (ref 6–22)
BUN: 18 mg/dL (ref 7–25)
CHLORIDE: 106 mmol/L (ref 98–110)
CO2: 26 mmol/L (ref 20–32)
Calcium: 9.7 mg/dL (ref 8.6–10.4)
Creat: 1.26 mg/dL — ABNORMAL HIGH (ref 0.50–0.99)
GFR, EST AFRICAN AMERICAN: 52 mL/min/{1.73_m2} — AB (ref >=60)
GFR, Est Non African American: 45 mL/min/{1.73_m2} — ABNORMAL LOW (ref >=60)
Globulin: 3 g/dL (calc) (ref 1.9–3.7)
Glucose, Bld: 122 mg/dL — ABNORMAL HIGH (ref 65–99)
Potassium: 4.6 mmol/L (ref 3.5–5.3)
Sodium: 141 mmol/L (ref 135–146)
TOTAL PROTEIN: 7.4 g/dL (ref 6.1–8.1)

## 2018-08-02 LAB — CBC WITH DIFFERENTIAL/PLATELET
BASOS ABS: 38 {cells}/uL (ref 0–200)
Basophils Relative: 0.5 %
EOS ABS: 152 {cells}/uL (ref 15–500)
Eosinophils Relative: 2 %
HCT: 32.6 % — ABNORMAL LOW (ref 35.0–45.0)
HEMOGLOBIN: 10.8 g/dL — AB (ref 11.7–15.5)
Lymphs Abs: 3017 cells/uL (ref 850–3900)
MCH: 28.2 pg (ref 27.0–33.0)
MCHC: 33.1 g/dL (ref 32.0–36.0)
MCV: 85.1 fL (ref 80.0–100.0)
MPV: 11.9 fL (ref 7.5–12.5)
Monocytes Relative: 4.8 %
NEUTROS PCT: 53 %
Neutro Abs: 4028 cells/uL (ref 1500–7800)
Platelets: 263 10*3/uL (ref 140–400)
RBC: 3.83 10*6/uL (ref 3.80–5.10)
RDW: 13.1 % (ref 11.0–15.0)
TOTAL LYMPHOCYTE: 39.7 %
WBC: 7.6 10*3/uL (ref 3.8–10.8)
WBCMIX: 365 {cells}/uL (ref 200–950)

## 2018-08-02 LAB — IRON: IRON: 47 ug/dL (ref 45–160)

## 2018-08-02 LAB — FERRITIN: Ferritin: 44 ng/mL (ref 16–288)

## 2018-08-02 LAB — HEMOGLOBIN A1C
EAG (MMOL/L): 8.9 (calc)
HEMOGLOBIN A1C: 7.2 %{Hb} — AB (ref <5.7)
MEAN PLASMA GLUCOSE: 160 (calc)

## 2018-08-03 ENCOUNTER — Encounter: Payer: Self-pay | Admitting: Internal Medicine

## 2018-08-08 ENCOUNTER — Other Ambulatory Visit: Payer: Self-pay | Admitting: *Deleted

## 2018-08-08 DIAGNOSIS — E119 Type 2 diabetes mellitus without complications: Secondary | ICD-10-CM

## 2018-08-08 MED ORDER — GLUCOSE BLOOD VI STRP
ORAL_STRIP | 11 refills | Status: DC
Start: 2018-08-08 — End: 2018-08-18

## 2018-08-08 NOTE — Telephone Encounter (Signed)
[facility] 

## 2018-08-11 ENCOUNTER — Other Ambulatory Visit: Payer: Self-pay | Admitting: Internal Medicine

## 2018-08-11 DIAGNOSIS — E119 Type 2 diabetes mellitus without complications: Secondary | ICD-10-CM

## 2018-08-18 ENCOUNTER — Other Ambulatory Visit: Payer: Self-pay | Admitting: *Deleted

## 2018-08-18 MED ORDER — ONETOUCH ULTRASOFT LANCETS MISC: 11 refills | Status: AC

## 2018-08-18 MED ORDER — ONETOUCH ULTRA 2 W/DEVICE KIT: PACK | 11 refills | Status: AC

## 2018-08-18 MED ORDER — GLUCOSE BLOOD VI STRP: ORAL_STRIP | 11 refills | Status: AC

## 2018-08-18 NOTE — Telephone Encounter (Signed)
[facility] 

## 2018-09-20 LAB — HM DIABETES EYE EXAM

## 2018-09-24 LAB — FECAL OCCULT BLOOD, GUAIAC: Fecal Occult Blood: NEGATIVE

## 2018-09-29 ENCOUNTER — Telehealth: Payer: Self-pay

## 2018-09-29 NOTE — Telephone Encounter (Signed)
Called to f/u on colonoscopy and diabetic eye exam. No answer, left vm to call back

## 2018-09-30 ENCOUNTER — Encounter: Payer: Self-pay | Admitting: *Deleted

## 2018-09-30 ENCOUNTER — Other Ambulatory Visit: Payer: Self-pay

## 2018-09-30 DIAGNOSIS — E119 Type 2 diabetes mellitus without complications: Secondary | ICD-10-CM

## 2018-09-30 MED ORDER — METFORMIN HCL 500 MG PO TABS: ORAL_TABLET | ORAL | 1 refills | Status: AC

## 2018-10-07 DIAGNOSIS — E119 Type 2 diabetes mellitus without complications: Secondary | ICD-10-CM | POA: Diagnosis not present

## 2018-10-07 DIAGNOSIS — M8580 Other specified disorders of bone density and structure, unspecified site: Secondary | ICD-10-CM | POA: Diagnosis not present

## 2018-10-07 DIAGNOSIS — Z008 Encounter for other general examination: Secondary | ICD-10-CM | POA: Diagnosis not present

## 2018-10-07 DIAGNOSIS — I10 Essential (primary) hypertension: Secondary | ICD-10-CM | POA: Diagnosis not present

## 2018-10-14 ENCOUNTER — Encounter: Payer: Self-pay | Admitting: *Deleted

## 2018-10-28 ENCOUNTER — Other Ambulatory Visit: Payer: Self-pay

## 2018-10-28 DIAGNOSIS — I10 Essential (primary) hypertension: Secondary | ICD-10-CM | POA: Diagnosis not present

## 2018-10-28 DIAGNOSIS — I739 Peripheral vascular disease, unspecified: Secondary | ICD-10-CM | POA: Diagnosis not present

## 2018-10-28 DIAGNOSIS — E1122 Type 2 diabetes mellitus with diabetic chronic kidney disease: Secondary | ICD-10-CM | POA: Diagnosis not present

## 2018-10-28 DIAGNOSIS — E119 Type 2 diabetes mellitus without complications: Secondary | ICD-10-CM | POA: Diagnosis not present

## 2018-10-28 DIAGNOSIS — N183 Chronic kidney disease, stage 3 (moderate): Secondary | ICD-10-CM | POA: Diagnosis not present

## 2018-10-28 DIAGNOSIS — M8580 Other specified disorders of bone density and structure, unspecified site: Secondary | ICD-10-CM | POA: Diagnosis not present

## 2018-10-28 DIAGNOSIS — R011 Cardiac murmur, unspecified: Secondary | ICD-10-CM | POA: Diagnosis not present

## 2018-10-28 DIAGNOSIS — Z0001 Encounter for general adult medical examination with abnormal findings: Secondary | ICD-10-CM | POA: Diagnosis not present

## 2018-10-28 DIAGNOSIS — Z79899 Other long term (current) drug therapy: Secondary | ICD-10-CM | POA: Diagnosis not present

## 2018-10-28 NOTE — Telephone Encounter (Signed)
Incoming [facility] from [facility]   Dear Prescriber,  We spoke to your patient about diabetes care and noticed your patient has not filled a statin [facility] at [facility] in the last 180 days.  Your patient would like [place] to reach out on their behalf to determine if it is appropriate to start a statin therapy. Please send a new rx for statin therapy if it is appropriate.  Thank you in advance for taking time to review this information. Sincerely,  Your local [facility] pharmacist   Last lipid check was 08/01/18, please indicate when cholesterol should be rechecked. Patient has pending appointment with you in March 2020. No

## 2018-10-28 NOTE — Telephone Encounter (Signed)
Spoke with patient, patient states she discontinued medication over 3 months ago. Patient also stated she is established with a new PCP.   I made note that patient is no longer under [facility] care on fax and sent it back to [facility]

## 2019-01-14 ENCOUNTER — Ambulatory Visit (HOSPITAL_COMMUNITY)
Admission: RE | Admit: 2019-01-14 | Payer: Medicare Other | Source: Ambulatory Visit | Attending: Cardiovascular Disease | Admitting: Cardiovascular Disease

## 2019-01-21 ENCOUNTER — Encounter (HOSPITAL_COMMUNITY): Payer: Medicare Other

## 2019-01-29 ENCOUNTER — Ambulatory Visit: Payer: Medicare Other | Admitting: Nurse Practitioner

## 2019-02-13 ENCOUNTER — Encounter (HOSPITAL_COMMUNITY): Payer: Medicare Other

## 2019-02-18 ENCOUNTER — Telehealth: Payer: Self-pay

## 2019-02-18 NOTE — Telephone Encounter (Signed)
Unable to contact pt phone rings then hangs up. Will need TO KEEP APPT AND MAKE EITHER TELE-VISIT OR VIDEO VISIT.

## 2019-02-19 NOTE — Telephone Encounter (Signed)
Tried to call pt to discuss appt 4-6, no VM full. Will try again later

## 2019-02-23 ENCOUNTER — Ambulatory Visit: Payer: Medicare Other | Admitting: Adult Health

## 2019-02-23 ENCOUNTER — Telehealth (INDEPENDENT_AMBULATORY_CARE_PROVIDER_SITE_OTHER): Payer: Medicare Other | Admitting: Adult Health

## 2019-02-23 VITALS — BP 136/70 | HR 65 | Ht 63.0 in | Wt 128.5 lb

## 2019-02-23 DIAGNOSIS — I10 Essential (primary) hypertension: Secondary | ICD-10-CM

## 2019-02-23 DIAGNOSIS — I6523 Occlusion and stenosis of bilateral carotid arteries: Secondary | ICD-10-CM

## 2019-02-23 DIAGNOSIS — I491 Atrial premature depolarization: Secondary | ICD-10-CM

## 2019-02-23 DIAGNOSIS — E7800 Pure hypercholesterolemia, unspecified: Secondary | ICD-10-CM

## 2019-02-23 MED ORDER — ATORVASTATIN CALCIUM 20 MG PO TABS: 20.0000 mg | ORAL_TABLET | Freq: Every day | ORAL | 6 refills | Status: DC

## 2019-02-23 NOTE — Telephone Encounter (Signed)
Pt would like a telephone call visit pt has email and BP and scale will have vitals and meds available for pre-visit call to verify

## 2019-02-23 NOTE — Progress Notes (Signed)
Virtual Visit Via Telephone Encounter    This visit type was conducted due to national recommendations for restrictions regarding the COVID-19 Pandemic (e.g. social distancing) in an effort to limit this patient's exposure and mitigate transmission in our community.  Due to her co-morbid illnesses, this patient is at least at moderate risk for complications without adequate follow up.  This format is felt to be most appropriate for this patient at this time.  All issues noted in this document were discussed and addressed.  A limited physical exam was performed with this format.  Please refer to the patient's chart for her consent to telehealth for [facility].   Evaluation Performed:  Follow-up visit  This visit type was conducted due to national recommendations for restrictions regarding the COVID-19 Pandemic (e.g. social distancing).  This format is felt to be most appropriate for this patient at this time.  All issues noted in this document were discussed and addressed.  No physical exam was performed (except for noted visual exam findings with Telehealth visits).  See MyChart message from today for the patient's consent to telehealth for [facility].  Patient has given permission to conduct this visit via virtual appointment and to bill insurance.   Date:  02/23/2019   ID:  Michelle Robertson, DOB 09/30/1951, MRN [id number]  Patient Location:  [place] [address]   Provider location:   [place], [place]-Home office   PCP:  Lauree Chandler, NP  Cardiologist:  Skeet Latch, MD  Electrophysiologist:  None   Chief Complaint:  Follow up.  History of Present Illness:    Michelle Robertson is a 68 y.o. female who presents via Engineer, civil (consulting) for a telehealth visit today for ongoing assessment and management of palpitations (PVC's and PAC's). Other history includes Type II diabetes, HTN, carotid artery disease, and hypercholesterolemia.    She was  started on Lipitor 20 ng daily and discussion about mild carotid artery disease was had by Rosaria Ferries, PA on last office visit on 02/21/2018. She was to continue on metoprolol for BP control.  She has no complaints today on this visit. She states that her BP is running very good, she has not had any palpitations or heart racing. She is keeping her blood sugar under control.  She is doing well.   The patient does not have symptoms concerning for COVID-19 infection (fever, chills, cough, or new SHORTNESS OF BREATH).   Carotid Ultrasound 01/01/2018 Final Interpretation: Right Carotid: Velocities in the right ICA are consistent with a 40-59%                stenosis.  Left Carotid: Velocities in the left ICA are consistent with a 40-59% stenosis.  Vertebrals:  Both vertebral arteries were patent with antegrade flow. Subclavians: Right subclavian artery flow was disturbed. Normal flow              hemodynamics were seen in the left subclavian artery.    Prior CV studies:   The following studies were reviewed today:  MYOVIEW: 12/24/2017  The left ventricular ejection fraction is normal (55-65%).  Nuclear stress EF: 55%.  There was no ST segment deviation noted during stress.  The study is normal.  This is a low risk study. This study is affected by artifacts - extracardiac activity on the stress images and overall lower counts. No obvious infarct or ischemia is seen.  There are also frequent PVCs during stress. Consider coronary CTA for further evaluation. Normal LVEF.  Past Medical History:  Diagnosis Date   Diabetes mellitus without complication ([place])    Ectopic pregnancy    Essential hypertension 01/30/2016   History of palpitations    PAC (premature atrial contraction) 07/28/2016   PVC (premature ventricular contraction)    hx of PVC's and PAC's   SOB (shortness of breath) on exertion    Past Surgical History:  Procedure Laterality Date   TUBAL LIGATION        Current Meds  Medication Sig   atorvastatin (LIPITOR) 20 MG tablet Take 1 tablet (20 mg total) by mouth daily.   Blood Glucose Monitoring Suppl (ONE TOUCH ULTRA 2) w/Device KIT Use to test blood sugar three times daily. Dx: E11.9   Cholecalciferol (VITAMIN D) 2000 units CAPS Take by mouth.   glucose blood (ONE TOUCH ULTRA TEST) test strip Use to test blood sugar three times daily. Dx:E11.9   Lancets (ONETOUCH ULTRASOFT) lancets Use to check blood sugar three times daily. Dx:E11.9   metFORMIN (GLUCOPHAGE) 500 MG tablet TAKE 1 TABLET BY MOUTH TWICE DAILY WITH A MEAL for diabetes   metoprolol succinate (TOPROL-XL) 25 MG 24 hr tablet Take 1 tablet (25 mg total) by mouth daily.   TURMERIC PO Take by mouth.     Allergies:   Patient has no known allergies.   Social History   Tobacco Use   Smoking status: Never Smoker   Smokeless tobacco: Never Used  Substance Use Topics   Alcohol use: No   Drug use: No     Family Hx: The patient's family history includes Cancer in her father; Diabetes in her maternal aunt, mother, and paternal grandmother; Stroke in her mother.  ROS:   Please see the history of present illness.    All other systems reviewed and are negative.   Labs/Other Tests and Data Reviewed:    Recent Labs: 08/01/2018: ALT 10; BUN 18; Creat 1.26; Hemoglobin 10.8; Platelets 263; Potassium 4.6; Sodium 141   Recent Lipid Panel Lab Results  Component Value Date/Time   CHOL 251 (H) 08/01/2018 02:00 PM   CHOL 245 (H) 02/19/2018 10:30 AM   TRIG 213 (H) 08/01/2018 02:00 PM   HDL 45 (L) 08/01/2018 02:00 PM   HDL 52 02/19/2018 10:30 AM   CHOLHDL 5.6 (H) 08/01/2018 02:00 PM   LDLCALC 168 (H) 08/01/2018 02:00 PM   LDLDIRECT 134.0 07/19/2015 03:41 PM    Wt Readings from Last 3 Encounters:  02/23/19 128 lb 8 oz (58.3 kg)  08/01/18 132 lb (59.9 kg)  02/21/18 130 lb 3.2 oz (59.1 kg)     Exam:    Vital Signs:  BP 136/70 (BP Location: Left Arm)    Pulse 65    Ht 5'  3" (1.6 m)    Wt 128 lb 8 oz (58.3 kg)    BMI 22.76 kg/m    Unable to do physical assessment via phone. No shortness of breath with speaking to me. She was able to communicate well and verbalized understanding of questions.   ASSESSMENT & PLAN:    1.  Hypertension: Well controlled by patient report, and current vital signs. No changes in her medication regimen for now. Refills are provided.   2. Palpitations: She reports no further episodes of racing heart or palpitations Continue current regimen.  3. Hypercholesterolemia: She continues on statin therapy. Follow up labs by PCP.   4. Carotid Bruits: This was found on previous appointment with follow up carotid to be rescheduled to later date due to [place]  pandemic.   COVID-19 Education: The signs and symptoms of COVID-19 were discussed with the patient and how to seek care for testing (follow up with PCP or arrange E-visit). The importance of social distancing was discussed today.  Patient Risk:   After full review of this patients clinical status, I feel that they are at least moderate risk at this time.  Time:   Today, I have spent 8 minutes with the patient with telehealth technology discussing diagnosis, symptoms and medications.     Medication Adjustments/Labs and Tests Ordered: Current medicines are reviewed at length with the patient today.  Concerns regarding medicines are outlined above.   Tests Ordered: No orders of the defined types were placed in this encounter.  Medication Changes: Meds ordered this encounter  Medications   atorvastatin (LIPITOR) 20 MG tablet    Sig: Take 1 tablet (20 mg total) by mouth daily.    Dispense:  30 tablet    Refill:  6    Disposition:  6 months unless symptomatic  Signed, Phill Myron. [place], ANP, AACC  02/23/2019 3:20 PM    [facility] [place], [place], [place]  [address] Phone: [phone]; Fax: [phone]

## 2019-02-23 NOTE — Patient Instructions (Addendum)
Follow-Up: You will need a follow up appointment in 6 months.  Please call our office 2 months in advance august 2020 to schedule this, October 2020 appointment.  You may see Skeet Latch, MD , Jory Sims, DNP, AACC  or one of the following Advanced Practice Providers on your designated Care Team:    Kerin Ransom, [place]  Roby Lofts, PA-C  Sande Rives, [place]       Medication Instructions:  NO CHANGES- Your physician recommends that you continue on your current medications as directed. Please refer to the Current Medication list given to you today. If you need a refill on your cardiac medications before your next appointment, please call your pharmacy. Labwork: When you have labs (blood work) and your tests are completely normal, you will receive your results ONLY by [place] (if you have MyChart) -OR- A paper copy in the mail.  At [facility], you and your health needs are our priority.  As part of our continuing mission to provide you with exceptional heart care, we have created designated Provider Care Teams.  These Care Teams include your primary Cardiologist (physician) and Advanced Practice Providers (APPs -  Physician Assistants and Nurse Practitioners) who all work together to provide you with the care you need, when you need it.  Thank you for choosing [facility] at [facility]!!

## 2019-03-11 ENCOUNTER — Other Ambulatory Visit: Payer: Self-pay | Admitting: Nurse Practitioner

## 2019-03-11 DIAGNOSIS — E119 Type 2 diabetes mellitus without complications: Secondary | ICD-10-CM

## 2019-03-19 ENCOUNTER — Encounter (HOSPITAL_COMMUNITY): Payer: Medicare Other

## 2019-04-24 ENCOUNTER — Encounter (HOSPITAL_COMMUNITY): Payer: Medicare Other

## 2019-07-15 ENCOUNTER — Other Ambulatory Visit (HOSPITAL_COMMUNITY): Payer: Self-pay | Admitting: Cardiovascular Disease

## 2019-07-15 DIAGNOSIS — I6523 Occlusion and stenosis of bilateral carotid arteries: Secondary | ICD-10-CM

## 2019-07-17 ENCOUNTER — Other Ambulatory Visit: Payer: Self-pay | Admitting: Family

## 2019-07-17 DIAGNOSIS — I70223 Atherosclerosis of native arteries of extremities with rest pain, bilateral legs: Secondary | ICD-10-CM

## 2019-07-17 DIAGNOSIS — I6523 Occlusion and stenosis of bilateral carotid arteries: Secondary | ICD-10-CM

## 2019-07-24 ENCOUNTER — Inpatient Hospital Stay: Admission: RE | Admit: 2019-07-24 | Payer: Medicare Other | Source: Ambulatory Visit

## 2019-07-28 ENCOUNTER — Ambulatory Visit
Admission: RE | Admit: 2019-07-28 | Discharge: 2019-07-28 | Disposition: A | Discharge status: Home / Self Care | Payer: Medicare Other | Source: Ambulatory Visit | Attending: Family | Admitting: Family

## 2019-07-28 DIAGNOSIS — I6523 Occlusion and stenosis of bilateral carotid arteries: Secondary | ICD-10-CM

## 2019-07-28 DIAGNOSIS — I70223 Atherosclerosis of native arteries of extremities with rest pain, bilateral legs: Secondary | ICD-10-CM

## 2019-07-31 ENCOUNTER — Inpatient Hospital Stay (HOSPITAL_COMMUNITY): Admission: RE | Admit: 2019-07-31 | Payer: Medicare Other | Source: Ambulatory Visit

## 2019-07-31 ENCOUNTER — Ambulatory Visit
Admission: RE | Admit: 2019-07-31 | Discharge: 2019-07-31 | Disposition: A | Discharge status: Home / Self Care | Payer: Medicare Other | Source: Ambulatory Visit | Attending: Family | Admitting: Family

## 2019-07-31 DIAGNOSIS — I6523 Occlusion and stenosis of bilateral carotid arteries: Secondary | ICD-10-CM

## 2019-07-31 DIAGNOSIS — I70223 Atherosclerosis of native arteries of extremities with rest pain, bilateral legs: Secondary | ICD-10-CM

## 2019-08-30 ENCOUNTER — Other Ambulatory Visit: Payer: Self-pay | Admitting: Adult Health

## 2019-09-14 ENCOUNTER — Other Ambulatory Visit: Payer: Self-pay

## 2019-09-14 MED ORDER — ATORVASTATIN CALCIUM 20 MG PO TABS: 20.0000 mg | ORAL_TABLET | Freq: Every day | ORAL | 6 refills | Status: DC

## 2020-03-23 ENCOUNTER — Other Ambulatory Visit: Payer: Self-pay

## 2020-03-23 ENCOUNTER — Encounter: Payer: Self-pay | Admitting: Family Medicine

## 2020-03-23 ENCOUNTER — Ambulatory Visit (INDEPENDENT_AMBULATORY_CARE_PROVIDER_SITE_OTHER): Payer: Medicare Other | Admitting: Family Medicine

## 2020-03-23 VITALS — BP 144/67 | HR 76 | Ht 62.5 in | Wt 133.0 lb

## 2020-03-23 DIAGNOSIS — N814 Uterovaginal prolapse, unspecified: Secondary | ICD-10-CM

## 2020-03-23 NOTE — Progress Notes (Signed)
Thinks she has a uterine prolapse, she discovered it in early April   Last September pt states she suffered from severe constipation and did a lot of bearing down and not sure if that has cause the prolapse. Pt now takes metamucil every day and she has no more issues with constipation.

## 2020-03-25 ENCOUNTER — Encounter: Payer: Self-pay | Admitting: Family Medicine

## 2020-03-25 NOTE — Progress Notes (Signed)
   GYNECOLOGY PROBLEM  VISIT ENCOUNTER NOTE  Subjective:   Michelle Robertson is a 69 y.o. [id number] female here for a problem GYN visit.  Current complaints: had episode of constipation   With increased pushing and straining. Noted vaginal bulging and discomfort at that time. Continues now even though constipation resolved.  Reports all vaginal delivers. Denies urinary or rectal sx at that time. Not having frequent UTI sx.   Denies abnormal vaginal bleeding, discharge, pelvic pain, problems with intercourse or other gynecologic concerns.    Gynecologic History No LMP recorded. Patient is postmenopausal. Contraception: post menopausal status  Health Maintenance Due  Topic Date Due  . COVID-19 Vaccine (1) Never done  . COLONOSCOPY  Never done  . PNA vac Low Risk Adult (1 of 2 - PCV13) Never done  . MAMMOGRAM  11/13/2018  . HEMOGLOBIN A1C  01/30/2019  . FOOT EXAM  08/02/2019  . OPHTHALMOLOGY EXAM  09/21/2019    The following portions of the patient's history were reviewed and updated as appropriate: allergies, current medications, past family history, past medical history, past social history, past surgical history and problem list.  Review of Systems Pertinent items are noted in HPI.   Objective:  BP (!) 144/67   Pulse 76   Ht 5' 2.5" (1.588 m)   Wt 133 lb (60.3 kg)   BMI 23.94 kg/m  Gen: well appearing, NAD HEENT: no scleral icterus CV: RR Lung: Normal WOB Ext: warm well perfused  PELVIC: patient in lithotomy, chaperone present.  Normal appearing external genitalia; normal appearing vaginal mucosa and cervix. On valsalva, bulging of vaginal tissue present. Cervix appears lower than typical on speculum exam.  No abnormal discharge noted.   Normal uterine size, no other palpable masses, no uterine or adnexal tenderness. Had patient valsalva with digital exam and uterine body/cervix lowers to mid-vagina. Patient is able to contact pelvic floor muscles on digital exam but its appears  week. Minor cystoceole palpated anteriorly   Assessment and Plan:  1. Uterine prolapse Mild prolapse, discussed role of PT for official assessment and treatment. Patient is amenable to this plan. Reviewed other modalities including pessary and surgical but at this point the mild prolapse might be bet treated conservatively. Reviewed with patient that pelvic floor PT may include internal and external work on the muscles of the pelvis.  - Ambulatory referral to Physical Therapy  2. HCM - Reviewed preventative measures briefly- has aged out of pap smear screening. Needs mammogram-- last in 2019 - discussed OCIVD 19 vaccine safety and efficacy  Please refer to After Visit Summary for other counseling recommendations.   Return in about 6 months (around 09/23/2020) for follow up on pelvic floor PT.  Caren Macadam, MD, MPH, ABFM Attending [place] for [facility]

## 2020-04-11 ENCOUNTER — Other Ambulatory Visit: Payer: Self-pay

## 2020-04-11 ENCOUNTER — Ambulatory Visit (INDEPENDENT_AMBULATORY_CARE_PROVIDER_SITE_OTHER): Payer: Medicare Other | Admitting: Obstetrics & Gynecology

## 2020-04-11 ENCOUNTER — Encounter: Payer: Self-pay | Admitting: Obstetrics & Gynecology

## 2020-04-11 VITALS — Ht 62.5 in | Wt 131.0 lb

## 2020-04-11 DIAGNOSIS — N8110 Cystocele, unspecified: Secondary | ICD-10-CM | POA: Diagnosis not present

## 2020-04-11 DIAGNOSIS — Z01419 Encounter for gynecological examination (general) (routine) without abnormal findings: Secondary | ICD-10-CM

## 2020-04-11 NOTE — Patient Instructions (Signed)

## 2020-04-11 NOTE — Progress Notes (Signed)
Patient was seen on May 5th by Dr. Ernestina Patches at our [place] location. Patient advised to follow up with Dr. Ihor Dow about uterine prolapse. Dr. Ernestina Patches put in referral to PT but patient does not have appointment with them yet.  Kathrene Alu RN

## 2020-04-11 NOTE — Progress Notes (Signed)
History:  69 y.o. [id number] here today for evaluation of bulge that pt has noticed since 02/2020. Pt has had 3 SVDs all of 7# infants. Neither led to prolonged pushing. Pt denies incontinence of any type. She feel that the bulge makes her feel tired. Prev GYN records reviewed.   The following portions of the patient's history were reviewed and updated as appropriate: allergies, current medications, past family history, past medical history, past social history, past surgical history and problem list.  Review of Systems:  Pertinent items are noted in HPI.    Objective:  Physical Exam Height 5' 2.5" (1.588 m), weight 131 lb (59.4 kg).  CONSTITUTIONAL: Well-developed, well-nourished female in no acute distress.  HENT:  Normocephalic, atraumatic EYES: Conjunctivae and EOM are normal. No scleral icterus.  NECK: Normal range of motion SKIN: Skin is warm and dry. No rash noted. Not diaphoretic.No pallor. [place]: Alert and oriented to person, place, and time. Normal coordination.  Abd: Soft, nontender and nondistended; well healed vertical incision Pelvic: Normal appearing external genitalia; normal appearing vaginal mucosa and cervix.  Normal discharge.  Small uterus, no other palpable masses, no uterine or adnexal tenderness Grade 1 midline cystocele. Pt was examined sitting, lying and standing while performing a Valsalva movement. She did not exhibit any incontinence.      Assessment & Plan:  Grade 1 cystocele Pt was referred to PT she thought that was what this visit was for.   Referral for pelvic PT  F/u in 6 months or sooner prn  Total face-to-face time with patient was 30 min.  Greater than 50% was spent in counseling and coordination of care with the patient.   Ryan Ogborn L. Harraway-Smith, M.D., [place]nn June

## 2020-04-12 ENCOUNTER — Encounter: Payer: Self-pay | Admitting: Physical Therapy

## 2020-04-12 ENCOUNTER — Ambulatory Visit: Payer: Medicare Other | Attending: Obstetrics & Gynecology | Admitting: Physical Therapy

## 2020-04-12 DIAGNOSIS — E785 Hyperlipidemia, unspecified: Secondary | ICD-10-CM | POA: Insufficient documentation

## 2020-04-12 DIAGNOSIS — R278 Other lack of coordination: Secondary | ICD-10-CM | POA: Insufficient documentation

## 2020-04-12 DIAGNOSIS — E119 Type 2 diabetes mellitus without complications: Secondary | ICD-10-CM | POA: Diagnosis not present

## 2020-04-12 DIAGNOSIS — F411 Generalized anxiety disorder: Secondary | ICD-10-CM | POA: Insufficient documentation

## 2020-04-12 DIAGNOSIS — I493 Ventricular premature depolarization: Secondary | ICD-10-CM | POA: Insufficient documentation

## 2020-04-12 DIAGNOSIS — M545 Low back pain, unspecified: Secondary | ICD-10-CM

## 2020-04-12 DIAGNOSIS — M6281 Muscle weakness (generalized): Secondary | ICD-10-CM | POA: Insufficient documentation

## 2020-04-12 DIAGNOSIS — N814 Uterovaginal prolapse, unspecified: Secondary | ICD-10-CM | POA: Insufficient documentation

## 2020-04-12 DIAGNOSIS — H8110 Benign paroxysmal vertigo, unspecified ear: Secondary | ICD-10-CM | POA: Diagnosis not present

## 2020-04-12 DIAGNOSIS — I10 Essential (primary) hypertension: Secondary | ICD-10-CM | POA: Diagnosis not present

## 2020-04-12 NOTE — Patient Instructions (Addendum)
About Cystocele Overview The pelvic organs, including the bladder, are normally supported by pelvic floor muscles and ligaments.   When these muscles and ligaments are stretched, weakened or torn, the wall between the bladder and the vagina sags or herniates causing a prolapse, sometimes called a cystocele. This condition may cause discomfort and problems with emptying the bladder.  It can be present in various stages.  Some people are not aware of the changes. Others may notice changes at the vaginal opening or a feeling of the bladder dropping outside the body. Causes of a Cystocele A cystocele is usually caused by muscle straining or stretching during childbirth.  In addition, cystocele is more common after menopause, because the hormone estrogen helps keep the elastic tissues around the pelvic organs strong.  A cystocele is more likely to occur when levels of estrogen decrease. Other causes include: heavy lifting, chronic coughing, previous pelvic surgery and obesity.  Symptoms A bladder that has dropped from its normal position may cause: unwanted urine leakage (stress incontinence), frequent urination or urge to urinate, incomplete emptying of the bladder (not feeling bladder relief after emptying), pain or discomfort in the vagina, pelvis, groin, lower back or lower abdomen and frequent urinary tract infections.  Mild cases may not cause any symptoms.  Treatment Options . Pelvic floor (Kegel) exercises: Strength training the muscles in your genital area  . Behavioral changes: Treating and preventing constipation, taking time to empty your bladder properly, learning to lift properly and/or  avoid heavy lifting when possible, stopping smoking, avoiding weight gain and treating a chronic cough or bronchitis. . A pessary: A vaginal support device is sometimes used to help pelvic support caused by muscle and ligament changes. . Surgery: Aurgical repair may be necessary if symptoms cannot be managed  with exercise, behavioral changes and a pessary. Surgery is usually considered for severe cases. .   Laying down 2 times per day with hips on pillow for 10 minutes  [facility] [address] [place], [place] [address] Phone # [phone] Fax [phone]

## 2020-04-12 NOTE — Therapy (Signed)
[place] at [facility] [address], [place], [place], [address] Phone: [phone]   Fax:  [phone]  Physical Therapy Evaluation  Patient Details  Name: Michelle Robertson MRN: [id number] Date of Birth: 12-28-1950 Referring Provider (PT): Dr. Lavonia Drafts   Encounter Date: 04/12/2020  PT End of Session - 04/12/20 1659    Visit Number  1    Date for PT Re-Evaluation  06/07/20    Authorization Type  Medicare/medicaid    PT Start Time  1600    PT Stop Time  1652    PT Time Calculation (min)  52 min    Activity Tolerance  Patient tolerated treatment well    Behavior During Therapy  [facility] for tasks assessed/performed       Past Medical History:  Diagnosis Date  . Diabetes mellitus without complication ([place])   . Ectopic pregnancy   . Essential hypertension 01/30/2016  . History of palpitations   . PAC (premature atrial contraction) 07/28/2016  . PVC (premature ventricular contraction)    hx of PVC's and PAC's  . SOB (shortness of breath) on exertion     Past Surgical History:  Procedure Laterality Date  . TUBAL LIGATION      There were no vitals filed for this visit.   Subjective Assessment - 04/12/20 1609    Subjective  Early April found a bulge. Patient is getting pain in back and hips and pelvic area. When patient is on her feet for awhile feels bulge. Patient had to strain for constipation from September to December. She took Metamucil that helped with the staining.    Patient Stated Goals  stop feeling like something is hanging in the vaginal area    Currently in Pain?  Yes    Pain Score  6     Pain Location  Back   pelvic   Pain Orientation  Lower    Pain Descriptors / Indicators  Aching;Sore    Pain Type  Acute pain    Pain Onset  More than a month ago    Pain Frequency  Intermittent    Aggravating Factors   standing for awhile, walking    Pain Relieving Factors  massage the areas and rub herself    Multiple Pain Sites  No         [facility] PT Assessment - 04/12/20 0001      Assessment   Medical Diagnosis  N81.10 Grade 1 cystocele    Referring Provider (PT)  Dr. Ihor Dow, Hoyle Sauer    Onset Date/Surgical Date  02/18/20    Prior Therapy  none      Precautions   Precautions  None      Restrictions   Weight Bearing Restrictions  No      Balance Screen   Has the patient fallen in the past 6 months  No    Has the patient had a decrease in activity level because of a fear of falling?   No    Is the patient reluctant to leave their home because of a fear of falling?   No      Home Environment   Living Environment  Private residence      Prior Function   Leisure  picks up her 28 pound grand daughter      Cognition   Overall Cognitive Status  Within Functional Limits for tasks assessed      Observation/Other Assessments   Skin Integrity  scar on anterior abdomin it  limited in movement      Posture/Postural Control   Posture/Postural Control  No significant limitations      ROM / Strength   AROM / PROM / Strength  AROM;PROM;Strength      AROM   Lumbar - Left Side Bend  decreased by 25% with lumbar pain    Lumbar - Right Rotation  decreased by 25%      Strength   Right Hip ABduction  5/5    Right Hip ADduction  5/5    Left Hip Extension  4/5    Left Hip ABduction  4-/5    Left Hip ADduction  4/5      Palpation   Palpation comment  tendernss located in left lower abdomen                  Objective measurements completed on examination: See above findings.    Pelvic Floor Special Questions - 04/12/20 0001    Prior Pregnancies  Yes    Number of Pregnancies  3    Number of Vaginal Deliveries  3    Urinary Leakage  No    Urinary frequency  double void now   3-10 times at night   Fecal incontinence  No    Skin Integrity  Intact   dry   Prolapse  Anterior Wall   grade 1   Pelvic Floor Internal Exam  Patient confirms identification and approves PT  to assess pelvic floor and treatment    Exam Type  Vaginal    Palpation  tenderness located on the pubococcygeus    Strength  fair squeeze, definite lift       [facility] Adult PT Treatment/Exercise - 04/12/20 0001      Self-Care   Self-Care  Other Self-Care Comments    Other Self-Care Comments   education on placing hips on pillows 2 times per day while laying down to reduce her prolapse for 10 min; educated pateint on what a prolapse is             PT Education - 04/12/20 1658    Education Details  education on what a prolapse is and how to manage during the day with laying on her back with 2 pillows under her hips    Person(s) Educated  Patient    Methods  Explanation;Handout    Comprehension  Verbalized understanding       PT Short Term Goals - 04/12/20 1709      PT SHORT TERM GOAL #1   Title  independent with initial HEP    Time  4    Period  Weeks    Status  New    Target Date  05/10/20      PT SHORT TERM GOAL #2   Title  pain in hips and back decreased >/= 25% due to improve pelvic floor strength    Time  4    Period  Weeks    Status  New    Target Date  05/10/20        PT Long Term Goals - 04/12/20 1710      PT LONG TERM GOAL #1   Title  independent with advanced HEP    Time  8    Period  Weeks    Status  New    Target Date  06/07/20      PT LONG TERM GOAL #2   Title  back and hip pain decreased >/= 50% due ot increase core  and hip strength    Time  8    Period  Weeks    Status  New    Target Date  06/07/20      PT LONG TERM GOAL #3   Title  feels the cystocele only 25-50% of the day due to improved management of prolapse    Time  8    Period  Weeks    Status  New    Target Date  06/07/20      PT LONG TERM GOAL #4   Title  Pelvic floor strength >/= 4/5 holding for 15 seconds to reduce cystocele and pain    Time  8    Period  Weeks    Status  New    Target Date  06/07/20      PT LONG TERM GOAL #5   Title  improved mobility of abdoinal  scar to reduce the fascial tightness around the bladder to decrease getting up in the middle of the night </= 2-3 times per night    Time  8    Period  Weeks    Status  New    Target Date  06/07/20             Plan - 04/12/20 1700    Clinical Impression Statement  Patient is a 69 year old female with grade 1 cystocele that she noticed last April. Patient was feeling a bulge in the vaginal area after she would stand up for a period of time. Patient reports last year from 9/20 to 12/20 she had constipation. Patient reports 6/10 achiness in bilateral hips and back when standing or walking. Patient has abdominal scar in the lower abdomen with decreased mobility and fascial restrictions. Pelvic floor strength is 3/5 with small lift holding for 7 seconds. Anterior wall laxity with bulge coming to grade 1. Tenderness located in bilateral pubococcygeus. Left hip strength 4/5. Patient reports she has to double void at times to fully empty her bladder. Patient gets up 3-10 times per night. Patient will benefit from skilled therapy to improve pelvic floor strength and coordination, education on ways to manage prolapse, and reduce pain.    Personal Factors and Comorbidities  Age    Examination-Activity Limitations  Caring for Others;Carry;Lift;Locomotion Level;Toileting;Sleep    Stability/Clinical Decision Making  Evolving/Moderate complexity    Clinical Decision Making  Low    Rehab Potential  Excellent    PT Frequency  1x / week    PT Duration  8 weeks    PT Treatment/Interventions  ADLs/Self Care Home Management;Biofeedback;Cryotherapy;Electrical Stimulation;Moist Heat;Therapeutic activities;Therapeutic exercise;Manual techniques;Patient/family education;Neuromuscular re-education;Scar mobilization;Dry needling;Taping    PT Next Visit Plan  manual work to the scar and pubococcygeus, pressure management with lifting, pelvic floor contraction, abdominal strength, toileting for double voiding     Consulted and Agree with Plan of Care  Patient       Patient will benefit from skilled therapeutic intervention in order to improve the following deficits and impairments:  Decreased endurance, Decreased activity tolerance, Increased fascial restricitons, Pain, Decreased mobility, Increased muscle spasms, Decreased coordination, Decreased strength  Visit Diagnosis: Muscle weakness (generalized) - Plan: PT plan of care cert/re-cert  Other lack of coordination - Plan: PT plan of care cert/re-cert  Acute bilateral low back pain without sciatica - Plan: PT plan of care cert/re-cert  Cystocele with prolapse - Plan: PT plan of care cert/re-cert     Problem List Patient Active Problem List   Diagnosis Date Noted  .  SOB (shortness of breath) on exertion   . PVC (premature ventricular contraction)   . History of palpitations   . Ectopic pregnancy   . Diabetes mellitus without complication ([place])   . PAC (premature atrial contraction) 07/28/2016  . Essential hypertension 01/30/2016  . Nausea without vomiting 09/14/2015  . GAD (generalized anxiety disorder) 09/14/2015  . Left knee pain 07/20/2015  . BPPV (benign paroxysmal positional vertigo) 03/18/2015  . Diabetes mellitus type 2, controlled ([place]) 09/03/2013  . Palpitations 09/02/2013  . Hyperlipidemia 07/06/2010  . BUNIONS, BILATERAL 04/06/2010    Earlie Counts, PT 04/12/20 5:17 PM   [facility] [address], [place], [place], [address] Phone: [phone]   Fax:  [phone]  Name: Michelle Robertson MRN: [id number] Date of Birth: Jul 06, 1951

## 2020-04-27 ENCOUNTER — Encounter: Payer: Self-pay | Admitting: General Practice

## 2020-05-10 ENCOUNTER — Encounter: Payer: Medicare HMO | Attending: Physical Therapy | Admitting: Physical Therapy

## 2020-05-10 ENCOUNTER — Telehealth: Payer: Self-pay | Admitting: Physical Therapy

## 2020-05-10 NOTE — Telephone Encounter (Signed)
Called patient about her appointment she missed at 16:00 today. Left a message.  Earlie Counts, PT @6 /22/2021@ 4:27 PM '

## 2020-05-17 ENCOUNTER — Encounter: Payer: Medicare HMO | Admitting: Physical Therapy

## 2020-05-24 ENCOUNTER — Other Ambulatory Visit: Payer: Self-pay

## 2020-05-24 ENCOUNTER — Encounter: Payer: Medicare Other | Attending: Obstetrics & Gynecology | Admitting: Physical Therapy

## 2020-05-24 ENCOUNTER — Encounter: Payer: Self-pay | Admitting: Physical Therapy

## 2020-05-24 DIAGNOSIS — M545 Low back pain, unspecified: Secondary | ICD-10-CM

## 2020-05-24 DIAGNOSIS — N812 Incomplete uterovaginal prolapse: Secondary | ICD-10-CM | POA: Diagnosis not present

## 2020-05-24 DIAGNOSIS — R279 Unspecified lack of coordination: Secondary | ICD-10-CM | POA: Diagnosis not present

## 2020-05-24 DIAGNOSIS — M6281 Muscle weakness (generalized): Secondary | ICD-10-CM | POA: Diagnosis not present

## 2020-05-24 DIAGNOSIS — N814 Uterovaginal prolapse, unspecified: Secondary | ICD-10-CM

## 2020-05-24 DIAGNOSIS — R278 Other lack of coordination: Secondary | ICD-10-CM

## 2020-05-24 NOTE — Therapy (Signed)
[place] at [facility] [address] [place], [place], [address] Phone: [phone]   Fax:  [phone]  Physical Therapy Treatment  Patient Details  Name: Michelle Robertson MRN: [id number] Date of Birth: [birth year] Referring Provider (PT): Dr. Lavonia Drafts   Encounter Date: 05/24/2020   PT End of Session - 05/24/20 1656    Visit Number 2    Date for PT Re-Evaluation 06/07/20    Authorization Type Medicare/medicaid    PT Start Time 1605    PT Stop Time 1645    PT Time Calculation (min) 40 min    Activity Tolerance Patient tolerated treatment well    Behavior During Therapy [facility] for tasks assessed/performed           Past Medical History:  Diagnosis Date  . Diabetes mellitus without complication ([place])   . Ectopic pregnancy   . Essential hypertension 01/30/2016  . History of palpitations   . PAC (premature atrial contraction) 07/28/2016  . PVC (premature ventricular contraction)    hx of PVC's and PAC's  . SOB (shortness of breath) on exertion     Past Surgical History:  Procedure Laterality Date  . TUBAL LIGATION      There were no vitals filed for this visit.   Subjective Assessment - 05/24/20 1608    Subjective I have done some of the exercises but not daily. This condition has gotten me depressed. I am trying to not straining. I feel the bulge. I am drinking more water. Sometimes I have to perss the vaginal area to push the bulge upward.    Patient Stated Goals stop feeling like something is hanging in the vaginal area    Currently in Pain? No/denies                             [facility] Adult PT Treatment/Exercise - 05/24/20 0001      Self-Care   Self-Care Other Self-Care Comments    Other Self-Care Comments  showed patient videos of what a prolapse is, educated on ways to manage prolapse laying down with hips elevated to reduce the prolapse 1-2 times per day.  Using a pessary, prolapse  strap or spanx to manage the prolapse during the day; explained why she feels the prolapse more at night while taking a shower      Lumbar Exercises: Supine   Ab Set 10 reps;5 seconds    AB Set Limitations with ball squeeze and pelvic floor contraction    Bridge with [employer] 10 reps;1 second    Bridge with [employer] Limitations with hips on pillow      Manual Therapy   Manual Therapy Soft tissue mobilization    Manual therapy comments education on self circular massage of the abdomen    Soft tissue mobilization circular abdominal massage to improve peristalic motion of the intestines to promote bowel movements                  PT Education - 05/24/20 1653    Education Details Access Code: [email or web address]; information on prolapse, education on prolapse supports; ways to manage prolapse    Person(s) Educated Patient    Methods Explanation;Demonstration;Verbal cues;Handout    Comprehension Verbalized understanding;Returned demonstration            PT Short Term Goals - 05/24/20 1702      PT SHORT TERM GOAL #1   Title independent with initial HEP  Time 4    Status On-going    Target Date 05/10/20      PT SHORT TERM GOAL #2   Title pain in hips and back decreased >/= 25% due to improve pelvic floor strength    Time 4    Period Weeks    Status On-going             PT Long Term Goals - 04/12/20 1710      PT LONG TERM GOAL #1   Title independent with advanced HEP    Time 8    Period Weeks    Status New    Target Date 06/07/20      PT LONG TERM GOAL #2   Title back and hip pain decreased >/= 50% due ot increase core and hip strength    Time 8    Period Weeks    Status New    Target Date 06/07/20      PT LONG TERM GOAL #3   Title feels the cystocele only 25-50% of the day due to improved management of prolapse    Time 8    Period Weeks    Status New    Target Date 06/07/20      PT LONG TERM GOAL #4   Title Pelvic floor strength >/= 4/5 holding for  15 seconds to reduce cystocele and pain    Time 8    Period Weeks    Status New    Target Date 06/07/20      PT LONG TERM GOAL #5   Title improved mobility of abdoinal scar to reduce the fascial tightness around the bladder to decrease getting up in the middle of the night </= 2-3 times per night    Time 8    Period Weeks    Status New    Target Date 06/07/20                 Plan - 05/24/20 1657    Clinical Impression Statement Patient does her exercises but not daily. She is working on her constipation. Patient will notice her prolapse most at end of day or while she is going up and down the stairs. Patient feels pulling pain during the lower abdominal contraction and was instructed to not contract as hard. Patient reports her abdomen is bigger but has not gained weight and may be due to her muscle tone. Paitent undestands her back pain may be due to her posture and needs strength. Patient will benefit from skilled therapy to improve pelvic floor strength and coordination, education on ways to manage prolapse, and reduce pain.    Personal Factors and Comorbidities Age    Examination-Activity Limitations Caring for Others;Carry;Lift;Locomotion Level;Toileting;Sleep    Stability/Clinical Decision Making Evolving/Moderate complexity    Rehab Potential Excellent    PT Frequency 1x / week    PT Duration 8 weeks    PT Treatment/Interventions ADLs/Self Care Home Management;Biofeedback;Cryotherapy;Electrical Stimulation;Moist Heat;Therapeutic activities;Therapeutic exercise;Manual techniques;Patient/family education;Neuromuscular re-education;Scar mobilization;Dry needling;Taping    PT Next Visit Plan Patient may want to go to [place] for pelvic floor therapy due to being closer to home. She has the information on calling them. Work on toileting to reduce straing, pelvic floor contraction, release of the lower abdominal fascial, work on daily activities with pressure management to reduce the  strain on the pelvic floor    PT Home Exercise Plan Access Code: [email or web address]    Recommended Other Services MD signed initial eval    Consulted  and Agree with Plan of Care Patient           Patient will benefit from skilled therapeutic intervention in order to improve the following deficits and impairments:  Decreased endurance, Decreased activity tolerance, Increased fascial restricitons, Pain, Decreased mobility, Increased muscle spasms, Decreased coordination, Decreased strength  Visit Diagnosis: Muscle weakness (generalized)  Other lack of coordination  Acute bilateral low back pain without sciatica  Cystocele with prolapse     Problem List Patient Active Problem List   Diagnosis Date Noted  . SOB (shortness of breath) on exertion   . PVC (premature ventricular contraction)   . History of palpitations   . Ectopic pregnancy   . Diabetes mellitus without complication ([place])   . PAC (premature atrial contraction) 07/28/2016  . Essential hypertension 01/30/2016  . Nausea without vomiting 09/14/2015  . GAD (generalized anxiety disorder) 09/14/2015  . Left knee pain 07/20/2015  . BPPV (benign paroxysmal positional vertigo) 03/18/2015  . Diabetes mellitus type 2, controlled ([place]) 09/03/2013  . Palpitations 09/02/2013  . Hyperlipidemia 07/06/2010  . BUNIONS, BILATERAL 04/06/2010    Earlie Counts, PT 05/24/20 5:03 PM   [facility] [address], [place], [place], [address] Phone: [phone]   Fax:  [phone]  Name: Linsey Arteaga MRN: [id number] Date of Birth: 01-May-1951

## 2020-05-24 NOTE — Patient Instructions (Addendum)
About Pelvic Support Problems Pelvic Support Problems Explained Ligaments, muscles, and connective tissue normally hold your bladder, uterus, and other organs in their proper places in your pelvis. When these tissues become weak, a problem with pelvic support may result. Weak support can cause one or more of the pelvic organs to drop down into the vagina. An organ may even drop so far that is partially exposed outside the body.  Pelvic support problems are named by the change in the organ. The main types of pelvic support problems are:  . Cystocele: When the bladder drops down into your vagina.  . Enterocele: When your small intestine drops between your vagina and rectum.  . Rectocele: When your rectum bulges into the vaginal wall.  Marland Kitchen Uterine prolapse: When your uterus drops into your vagina.  . Vaginal prolapse: When the top part of the vagina begins to droop. This sometimes happens after a hysterectomy (removal of the uterus).  Causes Pelvic support problems can be caused by many conditions. They may begin after you give birth, especially if you had a large baby. During childbirth, the muscles and skin of the birth canal (vagina) are stretched and sometimes torn. They heal over time but are not always exactly the same. A long pushing stage of labor may also weaken these tissues as well as very rapid births as the tissues do not have time to stretch so they tear.  Also, after menopause, there are changes in the vaginal walls resulting from a decrease in estrogen. Estrogen helps to keep the tissues toned. Low levels of estrogen weaken the vaginal walls and may cause the bladder to shift from its normal position. As women get older, the loss of muscle tone and the relaxation of muscles may cause the uterus or other organs to drop.  Over time, conditions like chronic coughing, chronic constipation, doing a lot of heavy lifting, straining to pass stool, and obesity, can also weaken the pelvic support  muscles.  Diagnosing Pelvic Support Problems Your health care provider will ask about your symptoms and do a pelvic examination. Your provider may also do a rectal exam during your pelvic exam. Your provider may ask you to: 1. Bear down and push (like you are having a bowel movement) so he or she can see if your bladder or other part of your body protrudes into the vagina. 2. Contract the muscles of your pelvis to check the strength of your pelvic muscles.  3. Do several types of urine, nerve and muscle tests of the pelvis and around the bladder to see what type of treatment is best for you.   Symptoms Symptoms of pelvic support problems depend on the organ involved, but may include:  . urine leakage  . stain or fecal loss after a bowel movement . trouble having bowel movements  . ache in the lower abdomen, groin, or lower back  . bladder infection  . a feeling of heaviness, pulling, or fullness in the pelvis, or a feeling that something is falling out of the vagina  . an organ protruding from your vaginal opening  . feeling the need to support the organs or perineal area to empty bladder or bowels . painful sexual intercourse.  Many women feel pelvic pressure or trouble holding their urine immediately after childbirth. For some, these symptoms go away permanently, in others they return as they get older.  Treatment Options A prolapsed organ cannot repair itself. Contact your health care provider as soon as you notice symptoms  of a problem. Treatment depends on what the specific problem is and how far advanced it is.  . The symptoms caused by some pelvic support problems may simply be treated with changes in diet, medicine to soften the stool, weight loss, or avoiding strenuous activities. You may also do pelvic floor exercises to help strengthen your pelvic muscles.  . Some cases of prolapse may require a special support device made from plastic or rubber called a pessary that fits into the  vagina to support the uterus, vagina, or bladder. A pessary can also help women who leak urine when coughing, straining, or exercising. In mild cases, a tampon or vaginal diaphragm may be used instead of a pessary.  Talk to your doctor or health care provider about these options. . In serious cases, surgery may be needed to put the organs back into their proper place. The uterus may be removed because of the pressure it puts on the bladder.  Your doctor will know what surgery will be best for you. How can I prevent pelvic support problems?  You can help prevent pelvic support problems by:  . maintaining a healthy lifestyle  . continuing to do pelvic floor exercises after you deliver a baby  . maintaining a healthy weight  . avoiding a lot of heavy lifting and lifting with your legs (not from your waist)  treating constipation and avoid getting   Items to use is Pessary Spanx Prolapse strap   Lay on your back with 2 pillows under your hips to reduce the prolapse for 30 minutes  Access Code: [email or web address] URL: [email or web address] Date: 05/24/2020 Prepared by: Earlie Counts  Exercises Supine Hip Adduction Isometric with Diona Foley - 1 x daily - 7 x weekly - 1 sets - 10 reps - 5 sec hold Supine Bridge with Mini Swiss Ball Between Knees - 1 x daily - 7 x weekly - 1 sets - 10 reps Supine Abdominal Wall Massage - 1 x daily - 7 x weekly - 1 sets - 30 reps  Earlie Counts, PT [facility] [place] [address] [place], [place] [address] W: [phone] [email or web address]

## 2020-05-25 ENCOUNTER — Other Ambulatory Visit: Payer: Self-pay

## 2020-05-25 MED ORDER — ATORVASTATIN CALCIUM 20 MG PO TABS: 20.0000 mg | ORAL_TABLET | Freq: Every day | ORAL | 3 refills | Status: DC

## 2020-05-25 NOTE — Telephone Encounter (Signed)
Rx(s) sent to pharmacy electronically.  

## 2020-05-31 ENCOUNTER — Encounter: Payer: Self-pay | Admitting: Physical Therapy

## 2020-05-31 ENCOUNTER — Other Ambulatory Visit: Payer: Self-pay

## 2020-05-31 ENCOUNTER — Encounter: Payer: Medicare Other | Admitting: Physical Therapy

## 2020-05-31 DIAGNOSIS — M6281 Muscle weakness (generalized): Secondary | ICD-10-CM

## 2020-05-31 DIAGNOSIS — N814 Uterovaginal prolapse, unspecified: Secondary | ICD-10-CM

## 2020-05-31 DIAGNOSIS — M545 Low back pain, unspecified: Secondary | ICD-10-CM

## 2020-05-31 DIAGNOSIS — N812 Incomplete uterovaginal prolapse: Secondary | ICD-10-CM | POA: Diagnosis not present

## 2020-05-31 DIAGNOSIS — R278 Other lack of coordination: Secondary | ICD-10-CM

## 2020-05-31 NOTE — Therapy (Addendum)
[place] at [facility] [address], [place], [place], [address] Phone: [phone]   Fax:  [phone]  Physical Therapy Treatment  Patient Details  Name: Michelle Robertson MRN: [id number] Date of Birth: October 22, 1951 Referring Provider (PT): Dr. Lavonia Drafts   Encounter Date: 05/31/2020   PT End of Session - 05/31/20 1617    Visit Number 3    Date for PT Re-Evaluation 06/07/20    Authorization Type Medicare/medicaid    PT Start Time 1600    PT Stop Time 1645    PT Time Calculation (min) 45 min    Activity Tolerance Patient tolerated treatment well    Behavior During Therapy [facility] for tasks assessed/performed           Past Medical History:  Diagnosis Date  . Diabetes mellitus without complication ([place])   . Ectopic pregnancy   . Essential hypertension 01/30/2016  . History of palpitations   . PAC (premature atrial contraction) 07/28/2016  . PVC (premature ventricular contraction)    hx of PVC's and PAC's  . SOB (shortness of breath) on exertion     Past Surgical History:  Procedure Laterality Date  . TUBAL LIGATION      There were no vitals filed for this visit.   Subjective Assessment - 05/31/20 1609    Subjective I have not been exercising as I should. I will be ordering the support belt. The prolapse wears me out. I am on iron due to being iron deficient.    Patient Stated Goals stop feeling like something is hanging in the vaginal area    Currently in Pain? No/denies                             [facility] Adult PT Treatment/Exercise - 05/31/20 0001      Self-Care   Self-Care Other Self-Care Comments    Other Self-Care Comments  reviewed the prolapse support and pessary to assist in managing her prolapse      Therapeutic Activites    Therapeutic Activities ADL's;Lifting    ADL's instruction patient how to get in and out of bed, sit to stand, vacuum, and stand  with breathing out and  decrease pressure on the pelvic floor,     Lifting squatting to lift itme with pelvic floor contraction and breathing out to reduce the strain on the pelvic floor      Lumbar Exercises: Supine   Ab Set 10 reps;5 seconds    AB Set Limitations with ball squeeze and pelvic floor contraction    Bridge with Diona Foley Squeeze 10 reps;1 second    Bridge with [employer] Limitations with ball squeeze    Other Supine Lumbar Exercises seated pelvic floro contraction holding for 5 seconds with no substitution of moving her trunk                  PT Education - 05/31/20 1654    Education Details Access Code: [email or web address]; education on correct body mechanics with pressure management to reduce the strain on prolapse    Person(s) Educated Patient    Methods Explanation;Demonstration;Handout    Comprehension Verbalized understanding;Returned demonstration            PT Short Term Goals - 05/31/20 1618      PT SHORT TERM GOAL #1   Title independent with initial HEP    Time 4    Period Weeks    Status Achieved  Target Date 05/10/20      PT SHORT TERM GOAL #2   Title pain in hips and back decreased >/= 25% due to improve pelvic floor strength    Time 4    Period Weeks    Status Achieved             PT Long Term Goals - 05/31/20 1618      PT LONG TERM GOAL #1   Title independent with advanced HEP    Time 8    Period Weeks    Status On-going      PT LONG TERM GOAL #2   Title back and hip pain decreased >/= 50% due ot increase core and hip strength    Baseline 25%    Time 8    Period Weeks    Status On-going      PT LONG TERM GOAL #3   Title feels the cystocele only 25-50% of the day due to improved management of prolapse    Baseline still the same    Time 8    Period Weeks    Status On-going      PT LONG TERM GOAL #4   Title Pelvic floor strength >/= 4/5 holding for 15 seconds to reduce cystocele and pain    Time 8    Period Weeks    Status On-going      PT LONG  TERM GOAL #5   Title improved mobility of abdoinal scar to reduce the fascial tightness around the bladder to decrease getting up in the middle of the night </= 2-3 times per night    Baseline low 2 and high 5 times    Time 8    Period Weeks    Status On-going                 Plan - 05/31/20 1617    Clinical Impression Statement Patient has difficulty exercising during the day due to taking care of her 67 year old grandson and being tired. Patient would like to get a prolapse support she can wear. Patient would like to be placed on hold to try the prolapse support to see if it will help with her symptoms and her HEP. Patient was instructed on ways to perform daily activities to redcue strain on the prolapse. Patient will get up at night from 2-5 times. Patient back and bilateral hip pain is 25% better. Paitent continues to feel the prolapse  with stairs, standing and activities. Patient would benefit from further therapy to work on pelvic floor and core strength if she decides to after trying the prolapse support.    Personal Factors and Comorbidities Age    Examination-Activity Limitations Caring for Others;Carry;Lift;Locomotion Level;Toileting;Sleep    Stability/Clinical Decision Making Evolving/Moderate complexity    Rehab Potential Excellent    PT Frequency 1x / week    PT Duration 8 weeks    PT Treatment/Interventions ADLs/Self Care Home Management;Biofeedback;Cryotherapy;Electrical Stimulation;Moist Heat;Therapeutic activities;Therapeutic exercise;Manual techniques;Patient/family education;Neuromuscular re-education;Scar mobilization;Dry needling;Taping    PT Next Visit Plan if patient does not come back by 8/1 she is to be discharged; if she comes back continue to work on pelrvic floor and core strength    PT Home Exercise Plan Access Code: [email or web address]    Consulted and Agree with Plan of Care Patient           Patient will benefit from skilled therapeutic intervention in order to  improve the following deficits and impairments:  Decreased endurance, Decreased  activity tolerance, Increased fascial restricitons, Pain, Decreased mobility, Increased muscle spasms, Decreased coordination, Decreased strength  Visit Diagnosis: Muscle weakness (generalized)  Other lack of coordination  Acute bilateral low back pain without sciatica  Cystocele with prolapse     Problem List Patient Active Problem List   Diagnosis Date Noted  . SOB (shortness of breath) on exertion   . PVC (premature ventricular contraction)   . History of palpitations   . Ectopic pregnancy   . Diabetes mellitus without complication ([place])   . PAC (premature atrial contraction) 07/28/2016  . Essential hypertension 01/30/2016  . Nausea without vomiting 09/14/2015  . GAD (generalized anxiety disorder) 09/14/2015  . Left knee pain 07/20/2015  . BPPV (benign paroxysmal positional vertigo) 03/18/2015  . Diabetes mellitus type 2, controlled ([place]) 09/03/2013  . Palpitations 09/02/2013  . Hyperlipidemia 07/06/2010  . BUNIONS, BILATERAL 04/06/2010    Earlie Counts, PT 05/31/20 5:00 PM   [place] at [facility] [address] [place], [place], [address] Phone: [phone]   Fax:  [phone]  Name: Michelle Robertson MRN: [id number] Date of Birth: 10-Aug-1951  PHYSICAL THERAPY DISCHARGE SUMMARY  Visits from Start of Care: 3  Current functional level related to goals / functional outcomes: See above. Patient did not schedule further appointments. She wanted to try the therapy on her own and see how it goes.    Remaining deficits: See above.    Education / Equipment: HEP Plan: Patient agrees to discharge.  Patient goals were partially met. Patient is being discharged due to being pleased with the current functional level.  Thank you for the referral. Earlie Counts, PT 06/23/20 8:04 AM  ?????

## 2020-05-31 NOTE — Patient Instructions (Signed)
Access Code: [email or web address] URL: [email or web address] Date: 05/31/2020 Prepared by: Earlie Counts  Exercises Supine Hip Adduction Isometric with Diona Foley - 1 x daily - 7 x weekly - 1 sets - 10 reps - 5 sec hold Supine Bridge with Mini Swiss Ball Between Knees - 1 x daily - 7 x weekly - 1 sets - 10 reps Supine Abdominal Wall Massage - 1 x daily - 7 x weekly - 1 sets - 30 reps Hooklying Isometric Hip Flexion - 1 x daily - 7 x weekly - 1 sets - 10 reps - 5 sec hold Seated Pelvic Floor Contraction - 3 x daily - 7 x weekly - 1 sets - 10 reps - 5 sec hold  Patient Education Posture and Body Mechanics Earlie Counts, PT [facility] [address] [place], [place] [address] W: [phone] [email or web address]

## 2020-07-08 ENCOUNTER — Ambulatory Visit (INDEPENDENT_AMBULATORY_CARE_PROVIDER_SITE_OTHER): Payer: Medicare Other | Admitting: Cardiovascular Disease

## 2020-07-08 ENCOUNTER — Other Ambulatory Visit: Payer: Self-pay

## 2020-07-08 ENCOUNTER — Encounter: Payer: Self-pay | Admitting: Cardiovascular Disease

## 2020-07-08 VITALS — BP 150/80 | HR 71 | Temp 97.0°F | Ht 62.5 in | Wt 134.0 lb

## 2020-07-08 DIAGNOSIS — I493 Ventricular premature depolarization: Secondary | ICD-10-CM | POA: Diagnosis not present

## 2020-07-08 DIAGNOSIS — I10 Essential (primary) hypertension: Secondary | ICD-10-CM

## 2020-07-08 NOTE — Patient Instructions (Signed)
Medication Instructions:  Your physician recommends that you continue on your current medications as directed. Please refer to the Current Medication list given to you today.  *If you need a refill on your cardiac medications before your next appointment, please call your pharmacy*  Lab Work: NONE  Testing/Procedures: NONE  Follow-Up: At [facility], you and your health needs are our priority.  As part of our continuing mission to provide you with exceptional heart care, we have created designated Provider Care Teams.  These Care Teams include your primary Cardiologist (physician) and Advanced Practice Providers (APPs -  Physician Assistants and Nurse Practitioners) who all work together to provide you with the care you need, when you need it.  We recommend signing up for the patient portal called "MyChart".  Sign up information is provided on this After Visit Summary.  MyChart is used to connect with patients for Virtual Visits (Telemedicine).  Patients are able to view lab/test results, encounter notes, upcoming appointments, etc.  Non-urgent messages can be sent to your provider as well.   To learn more about what you can do with MyChart, go to [email or web address].    Your next appointment:   12 month(s)  You will receive a reminder letter in the mail two months in advance. If you don't receive a letter, please call our office to schedule the follow-up appointment. The format for your next appointment:   In Person  Provider:   You may see Skeet Latch, MD or one of the following Advanced Practice Providers on your designated Care Team:    Kerin Ransom, PA-C  [place], [place]  Coletta Memos, [place]  Other Instructions  [place]. TAKE YOUR READINGS TO YOUR APPOINTMENT WITH YOUR PRIMARY CARE NEXT MONTH

## 2020-07-08 NOTE — Progress Notes (Signed)
Cardiology Office Note  Date:  07/08/2020   ID:  [name], DOB November 11, 1951, MRN [id number]  PCP:  Sonia Side., FNP  Cardiologist:   Skeet Latch, MD   No chief complaint on file.  Patient ID: Michelle Robertson is a 69 y.o. female with diabetes mellitus type 2, PACs, PVCs and hyperlipidemia who presents for follow up.  Michelle Robertson was seen in the ED on 01/2016 with palpitations.  She reported intermittent episodes of nausea and palpitations.  It was associated with shortness of breath, nausea and occasionally dizziness.   In the ED EKG revealed sinus rhythm with occasional PVCs.  Labs revealed mild anemia that was stable and no electrolyte abnormalities.  Michelle Robertson had an echo in 2014 that revealed LVEF 65-70% and was otherwise unremarkable.  She was started on metoprolol 43m daily, which helped.   She wore a 7 day event monitor that showed occasional PACs.  At her last appointment Michelle Robertson' blood pressure was poorly controlled and she had frequent palpitations.  Therefore metoprolol was increased.  She was also started on rosuvastatin.     Michelle Robertson passed away 8/201.  [place]  [place] she was settling his estate and was under a lot of stress.  She had some episodes of high blood pressure and palpitations.  Ms. CPonderhas not been exercising lately.  She tries to climb her stairs but does not get much formal exercise.  She gets tired with exertion.  However she denies chest pain.  She denies lower extremity edema, orthopnea, or PND.  The only time she feels short of breath is when her blood pressure is high.  At her last appointment her BP was elevated.  She didn't want to change medications because she attributed it to stress.  Since then metoprolol was switched to lisinopril.  When she checks her blood pressure at home it is generally well-controlled.  She states it is usually in the 120s over 60s to 78s  Her diet has been pretty good.   She mostly cooks at home.  She has been using [employer].  She has rare palpitations.      Past Medical History:  Diagnosis Date  . Diabetes mellitus without complication ([place]   . Ectopic pregnancy   . Essential hypertension 01/30/2016  . History of palpitations   . PAC (premature atrial contraction) 07/28/2016  . PVC (premature ventricular contraction)    hx of PVC's and PAC's  . SOB (shortness of breath) on exertion     Past Surgical History:  Procedure Laterality Date  . TUBAL LIGATION      Current Outpatient Medications  Medication Sig Dispense Refill  . atorvastatin (LIPITOR) 20 MG tablet Take 1 tablet (20 mg total) by mouth daily. 30 tablet 3  . Blood Glucose Monitoring Suppl (ONE TOUCH ULTRA 2) w/Device KIT Use to test blood sugar three times daily. Dx: E11.9 100 each 11  . clonazePAM (KLONOPIN) 0.5 MG tablet Take 1 tablet (0.5 mg total) by mouth 2 (two) times daily as needed. for anxiety 60 tablet 0  . glucose blood (ONE TOUCH ULTRA TEST) test strip Use to test blood sugar three times daily. Dx:E11.9 100 each 11  . Lancets (ONETOUCH ULTRASOFT) lancets Use to check blood sugar three times daily. Dx:E11.9 100 each 11  . lisinopril (ZESTRIL) 10 MG tablet Take 10 mg by mouth daily.    . metFORMIN (GLUCOPHAGE) 500 MG tablet  TAKE 1 TABLET BY MOUTH TWICE DAILY WITH A MEAL for diabetes 180 tablet 1  . POLY-IRON 150 FORTE 150-25-1 MG-MCG-MG CAPS Take 1 capsule by mouth daily.    . STOOL SOFTENER 100 MG capsule Take 100 mg by mouth at bedtime.    . Tetrahydrozoline HCl (VISINE OP) Place 2 drops into both eyes as needed (for dry eyes).    . TURMERIC PO Take by mouth.    . Vitamin D, Ergocalciferol, (DRISDOL) 1.25 MG (50000 UNIT) CAPS capsule Take 50,000 Units by mouth once a week.     No current facility-administered medications for this visit.    Allergies:   Patient has no known allergies.    Social History:  The patient  reports that she has never smoked. She has never used  smokeless tobacco. She reports that she does not drink alcohol and does not use drugs.   Family History:  The patient's family history includes Cancer in her father; Diabetes in her maternal aunt, mother, and paternal grandmother; Stroke in her mother.    ROS:  Please see the history of present illness.   Otherwise, review of systems are positive for none.   All other systems are reviewed and negative.    PHYSICAL EXAM: VS:  BP (!) 150/80   Pulse 71   Temp (!) 97 F (36.1 C)   Ht 5' 2.5" (1.588 m)   Wt 134 lb (60.8 kg)   SpO2 99%   BMI 24.12 kg/m  , BMI Body mass index is 24.12 kg/m. GENERAL:  Well appearing HEENT: Pupils equal round and reactive, fundi not visualized, oral mucosa unremarkable NECK:  No jugular venous distention, waveform within normal limits, carotid upstroke brisk and symmetric, no bruits, no thyromegaly LUNGS:  Clear to auscultation bilaterally HEART:  RRR.  PMI not displaced or sustained,S1 and S2 within normal limits, no S3, no S4, no clicks, no rubs, no murmurs ABD:  Flat, positive bowel sounds normal in frequency in pitch, no bruits, no rebound, no guarding, no midline pulsatile mass, no hepatomegaly, no splenomegaly EXT:  2 plus pulses throughout, no edema, no cyanosis no clubbing SKIN:  No rashes no nodules NEURO:  Cranial nerves II through XII grossly intact, motor grossly intact throughout PSYCH:  Cognitively intact, oriented to person place and time   EKG:  EKG is ordered today. 03/12/16: Sinus bradycardia.  Rate 59 bpm.   12/03/17: Sinus rhythm.  Rate 63 bpm.   07/08/20: Sinus rhythm.  Rate 71 bpm.  Nonspecific T wave abnormality.  7 Day Event Monitor 03/20/16:  Quality: Fair.  Baseline artifact. Predominant rhythm: sinus rhythm, sinus bradycardia Average heart rate: 70 bpm Max heart rate: 121 bpm Min heart rate: 47 bpm Pauses >2.5 seconds: 0  Occasional PACs.  Recent Labs: No results found for requested labs within last 8760 hours.     Lipid Panel    Component Value Date/Time   CHOL 251 (H) 08/01/2018 1400   CHOL 245 (H) 02/19/2018 1030   TRIG 213 (H) 08/01/2018 1400   HDL 45 (L) 08/01/2018 1400   HDL 52 02/19/2018 1030   CHOLHDL 5.6 (H) 08/01/2018 1400   VLDL 20 09/13/2016 0817   LDLCALC 168 (H) 08/01/2018 1400   LDLDIRECT 134.0 07/19/2015 1541      Wt Readings from Last 3 Encounters:  07/08/20 134 lb (60.8 kg)  04/11/20 131 lb (59.4 kg)  03/23/20 133 lb (60.3 kg)      ASSESSMENT AND PLAN:  # PACs/PVCs: Better-controlled. Likely  stress related.  # Hypertension: BP is above goal.  She notes that it has been well have been controlled at home.  She is not consistent about taking her medications.  She is going to start taking her lisinopril at the same time every day.  Check her blood pressure.  She already has follow-up with her PCP scheduled next month.  Continue lisinopril 10 mg daily for now  # Exertional dyspnea:  Resolved.  Lexiscan Myoview was negative 12/2017.  # Hyperlipidemia: Continue atorvastatin.   Current medicines are reviewed at length with the patient today.  The patient does not have concerns regarding medicines.  The following changes have been made: None  Labs/ tests ordered today include:   No orders of the defined types were placed in this encounter.    Disposition:   FU with Charidy Cappelletti C. Oval Linsey, MD, [facility] in a year or as needed  Signed, Carrick Rijos C. Oval Linsey, MD, [facility]  07/08/2020 4:45 PM    [place]

## 2020-07-13 NOTE — Addendum Note (Signed)
Addended by: Merri Ray A on: 07/13/2020 04:23 PM   Modules accepted: Orders

## 2020-08-19 ENCOUNTER — Other Ambulatory Visit: Payer: Self-pay | Admitting: Cardiovascular Disease

## 2020-09-21 ENCOUNTER — Ambulatory Visit: Payer: Medicare Other | Admitting: Family Medicine

## 2020-10-11 ENCOUNTER — Ambulatory Visit (INDEPENDENT_AMBULATORY_CARE_PROVIDER_SITE_OTHER): Payer: Medicare Other | Admitting: Obstetrics & Gynecology

## 2020-10-11 ENCOUNTER — Other Ambulatory Visit: Payer: Self-pay

## 2020-10-11 ENCOUNTER — Encounter: Payer: Self-pay | Admitting: Obstetrics & Gynecology

## 2020-10-11 VITALS — BP 124/70 | HR 84 | Ht 62.0 in | Wt 132.4 lb

## 2020-10-11 DIAGNOSIS — R339 Retention of urine, unspecified: Secondary | ICD-10-CM | POA: Diagnosis not present

## 2020-10-11 DIAGNOSIS — N8110 Cystocele, unspecified: Secondary | ICD-10-CM | POA: Diagnosis not present

## 2020-10-11 NOTE — Progress Notes (Signed)
   GYNECOLOGY OFFICE VISIT NOTE  History:   Michelle Robertson is a 69 y.o. [id number] here today for follow up for cystocele.  Last seen on 03/2020, was sent for pelvic PT. Reports worsening symptoms, feeling bulge more.  Also has to double void most of the time.  She denies any abnormal vaginal discharge, bleeding or other concerns.    Past Medical History:  Diagnosis Date  . Diabetes mellitus without complication ([place])   . Ectopic pregnancy   . Essential hypertension 01/30/2016  . History of palpitations   . PAC (premature atrial contraction) 07/28/2016  . PVC (premature ventricular contraction)    hx of PVC's and PAC's  . SOB (shortness of breath) on exertion     Past Surgical History:  Procedure Laterality Date  . TUBAL LIGATION     The following portions of the patient's history were reviewed and updated as appropriate: allergies, current medications, past family history, past medical history, past social history, past surgical history and problem list.    Review of Systems:  Pertinent items noted in HPI and remainder of comprehensive ROS otherwise negative.  Physical Exam:  BP 124/70   Pulse 84   Ht 5\' 2"  (1.575 m)   Wt 132 lb 6.4 oz (60.1 kg)   BMI 24.22 kg/m  CONSTITUTIONAL: Well-developed, well-nourished female in no acute distress.  SKIN: No rash noted. Not diaphoretic. No erythema. No pallor. MUSCULOSKELETAL: Normal range of motion. No edema noted. NEUROLOGIC: Alert and oriented to person, place, and time. Normal muscle tone coordination. No cranial nerve deficit noted. PSYCHIATRIC: Normal mood and affect. Normal behavior. Normal judgment and thought content. CARDIOVASCULAR: Normal heart rate noted RESPIRATORY: Effort and breath sounds normal, no problems with respiration noted ABDOMEN: No masses noted. No other overt distention noted.   PELVIC: Normal appearing external genitalia with moderate atrophy; normal urethral meatus; atrophic vaginal mucosa.  Grade 2 cystocele  noted on Valsalva.  Performed in the presence of a chaperone     Assessment and Plan:      1. Pelvic organ prolapse quantification stage 2 cystocele 2. Urinary retention with incomplete bladder emptying Given worsening symptoms, will refer to Urogynecology for further evaluation and management. - Ambulatory referral to Urogynecology Routine preventative health maintenance measures emphasized. Please refer to After Visit Summary for other counseling recommendations.   Return for any gynecologic concerns.    I spent 15 minutes dedicated to the care of this patient including pre-visit review of records, face to face time with the patient discussing her conditions and treatments and post visit ordering of testing.    Verita Schneiders, MD, [place] for [employer], [place]

## 2020-10-21 NOTE — Progress Notes (Deleted)
[facility] New Patient Evaluation and Consultation  Referring Provider: Osborne Oman, MD PCP: Sonia Side., FNP Date of Service: 10/27/2020  SUBJECTIVE Chief Complaint: No chief complaint on file.  History of Present Illness: Michelle Robertson is a 69 y.o. Black or African-American female seen in consultation at the request of Dr. Harolyn Rutherford for evaluation of prolapse.    Review of records significant for: Has been attending pelvic PT but feels prolapse is getting worse. Also has to double void to empty bladder.   Urinary Symptoms: {urine leakage?:24754} Leaks *** time(s) per {days/wks/mos/yrs:310907}.  Pad use: {NUMBERS 1-10:18281} {pad option:24752} per day.   She {ACTION; IS/IS UJW:11914782} bothered by her UI symptoms.  Day time voids ***.  Nocturia: *** times per night to void. Voiding dysfunction: she {empties:24755} her bladder well.  {DOES NOT does:27190::"does not"} use a catheter to empty bladder.  When urinating, she feels {urine symptoms:24756} Drinks: *** per day  UTIs: {NUMBERS 1-10:18281} UTI's in the last year.   {ACTIONS;DENIES/REPORTS:21021675::"Denies"} history of {urologic concerns:24757}  Pelvic Organ Prolapse Symptoms:                  She {denies/ admits to:24761} a feeling of a bulge the vaginal area. It has been present for {NUMBER 1-10:22536} {days/wks/mos/yrs:310907}.  She {denies/ admits to:24761} seeing a bulge.  This bulge {ACTION; IS/IS NFA:21308657} bothersome.  Bowel Symptom: Bowel movements: *** time(s) per {Time; day/week/month:13537} Stool consistency: {stool consistency:24758} Straining: {yes/no:19897}.  Splinting: {yes/no:19897}.  Incomplete evacuation: {yes/no:19897}.  She {denies/ admits to:24761} accidental bowel leakage / fecal incontinence  Occurs: *** time(s) per {Time; day/week/month:13537}  Consistency with leakage: {stool consistency:24758} Bowel regimen: {bowel regimen:24759} Last colonoscopy: Date ***,  Results ***  Sexual Function Sexually active: {yes/no:19897}.  Sexual orientation: {Sexual Orientation:[phone]} Pain with sex: {pain with sex:24762}  Pelvic Pain {denies/ admits to:24761} pelvic pain Location: *** Pain occurs: *** Prior pain treatment: *** Improved by: *** Worsened by: ***   Past Medical History:  Past Medical History:  Diagnosis Date  . Diabetes mellitus without complication ([place])   . Ectopic pregnancy   . Essential hypertension 01/30/2016  . History of palpitations   . PAC (premature atrial contraction) 07/28/2016  . PVC (premature ventricular contraction)    hx of PVC's and PAC's  . SOB (shortness of breath) on exertion      Past Surgical History:   Past Surgical History:  Procedure Laterality Date  . TUBAL LIGATION       Past OB/GYN History: G{NUMBERS 1-10:18281} P{NUMBERS 1-10:18281} Vaginal deliveries: ***,  Forceps/ Vacuum deliveries: ***, Cesarean section: *** Menopausal: {menopausal:24763} Contraception: ***. Last pap smear was ***.  Any history of abnormal pap smears: {yes/no:19897}.   Medications: She has a current medication list which includes the following prescription(s): atorvastatin, one touch ultra 2, clonazepam, glucose blood, onetouch ultrasoft, lisinopril, metformin, poly-iron 150 forte, stool softener, tetrahydrozoline hcl, turmeric, and vitamin d (ergocalciferol).   Allergies: Patient has No Known Allergies.   Social History:  Social History   Tobacco Use  . Smoking status: Never Smoker  . Smokeless tobacco: Never Used  Vaping Use  . Vaping Use: Never used  Substance Use Topics  . Alcohol use: No  . Drug use: No    Relationship status: {relationship status:24764} She lives with ***.   She {ACTION; IS/IS QIO:96295284} employed ***. Regular exercise: {Yes/No:304960894} History of abuse: {Yes/No:304960894}  Family History:   Family History  Problem Relation Age of Onset  . Diabetes Mother   . Stroke Mother    .  Diabetes Maternal Aunt   . Diabetes Paternal Grandmother   . Cancer Father      Review of Systems: ROS   OBJECTIVE Physical Exam: There were no vitals filed for this visit.  Physical Exam   GU / Detailed Urogynecologic Evaluation:  Pelvic Exam: Normal external female genitalia; Bartholin's and Skene's glands normal in appearance; urethral meatus normal in appearance, no urethral masses or discharge.   CST: {gen negative/positive:315881}  Reflexes: bulbocavernosis {DESC; PRESENT/NOT PRESENT:21021351}, anocutaneous {DESC; PRESENT/NOT PRESENT:21021351} ***bilaterally.  Speculum exam reveals normal vaginal mucosa {With/Without:20273} atrophy. Cervix {exam; gyn cervix:30847}. Uterus {exam; pelvic uterus:30849}. Adnexa {exam; adnexa:12223}.    s/p hysterectomy: Speculum exam reveals normal vaginal mucosa {With/Without:20273}  atrophy and normal vaginal cuff.  Adnexa {exam; adnexa:12223}.    With apex supported, anterior compartment defect was {reduced:24765}  Pelvic floor strength {Roman # I-V:19040}/V, puborectalis {Roman # I-V:19040}/V external anal sphincter {Roman # I-V:19040}/V  Pelvic floor musculature: Right levator {Tender/Non-tender:20250}, Right obturator {Tender/Non-tender:20250}, Left levator {Tender/Non-tender:20250}, Left obturator {Tender/Non-tender:20250}  POP-Q:   POP-Q                                               Aa                                               Ba                                                 C                                                Gh                                               Pb                                               tvl                                                Ap                                               Bp  D     Rectal Exam:  Normal sphincter tone, {rectocele:24766} distal rectocele, enterocoele {DESC; PRESENT/NOT PRESENT:21021351}, no rectal  masses, {sign of:24767} dyssynergia when asking the patient to bear down.  Post-Void Residual (PVR) by Bladder Scan: In order to evaluate bladder emptying, we discussed obtaining a postvoid residual and she agreed to this procedure.  Procedure: The ultrasound unit was placed on the patient's abdomen in the suprapubic region after the patient had voided. A PVR of *** ml was obtained by bladder scan.  Laboratory Results: @ENCLABS @   ***I visualized the urine specimen, noting the specimen to be {urine color:24768}  ASSESSMENT AND PLAN Ms. Nanni is a 69 y.o. with: No diagnosis found.    [name], MD   Medical Decision Making:  - Reviewed/ ordered a clinical laboratory test - Reviewed/ ordered a radiologic study - Reviewed/ ordered medicine test - Decision to obtain old records - Discussion of management of or test interpretation with an external physician / other healthcare professional  - Assessment requiring independent historian - Review and summation of prior records - Independent review of image, tracing or specimen

## 2020-10-27 ENCOUNTER — Ambulatory Visit: Payer: Medicare Other | Admitting: Obstetrics and Gynecology

## 2020-10-28 NOTE — Progress Notes (Signed)
[facility] New Patient Evaluation and Consultation  Referring Provider: Osborne Oman, MD PCP: Sonia Side., FNP Date of Service: 11/02/2020  SUBJECTIVE Chief Complaint: New Patient (Initial Visit) (Dr Harolyn Rutherford referral for prolapse/)  History of Present Illness: Michelle Robertson is a 69 y.o. Black or African-American female seen in consultation at the request of Dr. Harolyn Rutherford for evaluation of prolapse.    Review of records significant for: Has done pelvic floor PT but has worsening bulge symptoms and difficulty voiding.  Urinary Symptoms: Does not leak urine.   Day time voids every few hours.  Nocturia: 2-6 times per night to void. Voiding dysfunction: she sometimes does not empty her bladder well.  does not use a catheter to empty bladder.  When urinating, she feels the need to urinate multiple times in a row Sometimes drinks before she goes to bed. Has been increasing her water intake due to low BP.   UTIs: 0 UTI's in the last year.   Denies history of blood in urine and kidney or bladder stones  Pelvic Organ Prolapse Symptoms:                  She Admits to a feeling of a bulge the vaginal area. It has been present for 8 months.  She Admits to seeing a bulge.  This bulge is bothersome. Has attended several sessions of pelvic PT and did not see an improvement.   Bowel Symptom: Bowel movements: every other day Stool consistency: soft  Straining: yes.  Splinting: no.  Incomplete evacuation: no.  She Denies accidental bowel leakage / fecal incontinence Bowel regimen: fiber- metamucil Last colonoscopy: not done  Sexual Function Sexually active: no.   Pelvic Pain Denies pelvic pain  Past Medical History:  Past Medical History:  Diagnosis Date  . Diabetes mellitus without complication ([place])   . Ectopic pregnancy   . Essential hypertension 01/30/2016  . History of palpitations   . PAC (premature atrial contraction) 07/28/2016  . PVC (premature  ventricular contraction)    hx of PVC's and PAC's  . SOB (shortness of breath) on exertion      Past Surgical History:   Past Surgical History:  Procedure Laterality Date  . TUBAL LIGATION       Past OB/GYN History: G5 P3 Vaginal deliveries: 3,  Forceps/ Vacuum deliveries: 0, Cesarean section: 0 Menopausal: Yes Last pap smear was: patient unsure   Medications: She has a current medication list which includes the following prescription(s): one touch ultra 2, clonazepam, glucose blood, onetouch ultrasoft, lisinopril, metformin, poly-iron 150 forte, polyethylene glycol 400, turmeric, and vitamin d (ergocalciferol).   Allergies: Patient has No Known Allergies.   Social History:  Social History   Tobacco Use  . Smoking status: Never Smoker  . Smokeless tobacco: Never Used  Vaping Use  . Vaping Use: Never used  Substance Use Topics  . Alcohol use: No  . Drug use: No    Relationship status: widowed She lives with daughter and her husband.   She is not employed. Regular exercise: No History of abuse: No  Family History:   Family History  Problem Relation Age of Onset  . Diabetes Mother   . Stroke Mother   . Diabetes Maternal Aunt   . Diabetes Paternal Grandmother   . Cancer Father      Review of Systems: Review of Systems  Constitutional: Positive for malaise/fatigue. Negative for fever and weight loss.  Respiratory: Negative for cough, shortness of breath  and wheezing.   Cardiovascular: Negative for chest pain, palpitations and leg swelling.  Gastrointestinal: Positive for abdominal pain. Negative for blood in stool, nausea and vomiting.  Genitourinary: Negative for dysuria.  Musculoskeletal: Negative for myalgias.  Skin: Negative for rash.  Neurological: Negative for dizziness and headaches.  Endo/Heme/Allergies: Does not bruise/bleed easily.  Psychiatric/Behavioral: Negative for depression. The patient is not nervous/anxious.      OBJECTIVE Physical  Exam: Vitals:   11/02/20 1418  BP: 139/77  Pulse: 72  Weight: 131 lb 8 oz (59.6 kg)  Height: 5' 2.5" (1.588 m)    Physical Exam Constitutional:      General: She is not in acute distress. Pulmonary:     Effort: Pulmonary effort is normal.  Abdominal:     General: There is no distension.     Palpations: Abdomen is soft.     Tenderness: There is no abdominal tenderness. There is no rebound.  Musculoskeletal:        General: No swelling. Normal range of motion.  Skin:    General: Skin is warm and dry.     Findings: No rash.  Neurological:     Mental Status: She is alert and oriented to person, place, and time.  Psychiatric:        Mood and Affect: Mood normal.        Behavior: Behavior normal.      GU / Detailed Urogynecologic Evaluation:  Pelvic Exam: Normal external female genitalia; Bartholin's and Skene's glands normal in appearance; urethral meatus normal in appearance, no urethral masses or discharge.   CST: negative  Speculum exam reveals normal vaginal mucosa with atrophy. Cervix normal appearance. Uterus normal single, nontender. Adnexa no mass, fullness, tenderness.     Pelvic floor strength II/V, puborectalis IV/V external anal sphincter IV/V  Pelvic floor musculature: Right levator tender, Right obturator non-tender, Left levator tender, Left obturator non-tender  POP-Q:   POP-Q  -1.5                                            Aa   -1.5                                           Ba  -6                                              C   4                                            Gh  4                                            Pb  8  tvl   0                                            Ap  0                                            Bp  -7                                              D     Rectal Exam:  Normal sphincter tone, small distal rectocele, enterocoele not present, no rectal masses, no  sign of dyssynergia when asking the patient to bear down.  Post-Void Residual (PVR) by Bladder Scan: In order to evaluate bladder emptying, we discussed obtaining a postvoid residual and she agreed to this procedure.  Procedure: The ultrasound unit was placed on the patient's abdomen in the suprapubic region after the patient had voided. A PVR of 10 ml was obtained by bladder scan.  Laboratory Results: POC urine: negative I visualized the urine specimen, noting the specimen to be clear yellow  ASSESSMENT AND PLAN Ms. Nisley is a 69 y.o. with:  1. Prolapse of posterior vaginal wall   2. Prolapse of anterior vaginal wall   3. Nocturia    1. Stage I anterior, Stage II posterior, Stage I apical prolapse -For treatment of pelvic organ prolapse, we discussed options for management including expectant management, conservative management, and surgical management, such as Kegels, a pessary, pelvic floor physical therapy, and specific surgical procedures. - She is potentially interested in a pessary. She will consider and call for an appointment if desired.   2. Nocturia - Discussed avoiding drinking at least 2 hours prior to bedtime and avoiding caffeine in the afternoon.  - POC urine negative for infection today.   Return for pessary if desired.    [name], MD   Medical Decision Making:  - Reviewed/ ordered a clinical laboratory test - Review and summation of prior records - Independent review of urine specimen

## 2020-10-29 ENCOUNTER — Encounter: Payer: Self-pay | Admitting: Emergency Medicine

## 2020-10-29 ENCOUNTER — Emergency Department
Admission: EM | Admit: 2020-10-29 | Discharge: 2020-10-29 | Disposition: A | Discharge status: Home / Self Care | Payer: Medicare Other | Attending: Emergency Medicine | Admitting: Emergency Medicine

## 2020-10-29 ENCOUNTER — Other Ambulatory Visit: Payer: Self-pay

## 2020-10-29 DIAGNOSIS — Z79899 Other long term (current) drug therapy: Secondary | ICD-10-CM | POA: Diagnosis not present

## 2020-10-29 DIAGNOSIS — E119 Type 2 diabetes mellitus without complications: Secondary | ICD-10-CM | POA: Insufficient documentation

## 2020-10-29 DIAGNOSIS — I959 Hypotension, unspecified: Secondary | ICD-10-CM | POA: Diagnosis present

## 2020-10-29 DIAGNOSIS — Z7984 Long term (current) use of oral hypoglycemic drugs: Secondary | ICD-10-CM | POA: Diagnosis not present

## 2020-10-29 DIAGNOSIS — I10 Essential (primary) hypertension: Secondary | ICD-10-CM | POA: Diagnosis not present

## 2020-10-29 LAB — COMPREHENSIVE METABOLIC PANEL WITH GFR
ALT: 12 U/L (ref 0–44)
AST: 20 U/L (ref 15–41)
Albumin: 4.5 g/dL (ref 3.5–5.0)
Alkaline Phosphatase: 54 U/L (ref 38–126)
Anion gap: 11 (ref 5–15)
BUN: 22 mg/dL (ref 8–23)
CO2: 22 mmol/L (ref 22–32)
Calcium: 9.5 mg/dL (ref 8.9–10.3)
Chloride: 104 mmol/L (ref 98–111)
Creatinine, Ser: 1.14 mg/dL — ABNORMAL HIGH (ref 0.44–1.00)
GFR, Estimated: 53 mL/min — ABNORMAL LOW
Glucose, Bld: 128 mg/dL — ABNORMAL HIGH (ref 70–99)
Potassium: 4.1 mmol/L (ref 3.5–5.1)
Sodium: 137 mmol/L (ref 135–145)
Total Bilirubin: 0.5 mg/dL (ref 0.3–1.2)
Total Protein: 7.7 g/dL (ref 6.5–8.1)

## 2020-10-29 LAB — CBC
HCT: 35.6 % — ABNORMAL LOW (ref 36.0–46.0)
Hemoglobin: 11.4 g/dL — ABNORMAL LOW (ref 12.0–15.0)
MCH: 29.1 pg (ref 26.0–34.0)
MCHC: 32 g/dL (ref 30.0–36.0)
MCV: 90.8 fL (ref 80.0–100.0)
Platelets: 286 10*3/uL (ref 150–400)
RBC: 3.92 MIL/uL (ref 3.87–5.11)
RDW: 13.5 % (ref 11.5–15.5)
WBC: 6.6 10*3/uL (ref 4.0–10.5)
nRBC: 0 % (ref 0.0–0.2)

## 2020-10-29 NOTE — ED Triage Notes (Signed)
Pt to ER with c/o experiencing low BP readings at home over the last week.  Pt has hx of hypertension and takes medications for same.  Pt feeling weak and sometimes nausea when readings are low.  Pt denies lifestyle changes, states she did start a Vit C with zinc 3 weeks ago.

## 2020-10-29 NOTE — Discharge Instructions (Addendum)
Please drink at least 8 glasses of fluids every day.  Check your blood pressure at random times during the day both when you are feeling good as well as when you do not feel good.  Please keep a log of your blood pressure readings with a small note as to when you took it and how you are feeling at a time.  If you continue to have low blood pressure readings after increasing your fluid intake, half your blood pressure medication.  If you continue to have low blood pressure readings with increased fluid intake and decreasing your blood pressure medication, follow-up in the emergency department or return to your primary care for evaluation as you may not need blood pressure medication at that time.

## 2020-10-29 NOTE — ED Notes (Signed)
ED Provider at bedside. 

## 2020-10-29 NOTE — ED Provider Notes (Signed)
[facility] Emergency Department Provider Note  ____________________________________________  Time seen: Approximately 6:07 PM  I have reviewed the triage vital signs and the nursing notes.   HISTORY  Chief Complaint Hypotension    HPI Michelle Robertson is a 69 y.o. female who presented to the emergency department complaining of intermittent hypertension.  Patient states that she has had periods of feeling lightheaded and dizzy and typically takes her blood pressure and it is "low."  It appears that most of these low readings are low diastolic readings and not systolic readings the patient has had some soft blood pressures in the low 956L or 87F systolic.  Patient states that she has been diagnosed with hypertension approximately 5 years ago and has been on lisinopril since.  There is been no recent changes in her health.  She denied any other complaints of associated chest pain, abdominal pain, nausea vomiting.  She does endorse drinking less fluid than she should every day.  Patient has a history of hypertension, diabetes, PVCs.  No complaints with chronic medical history.         Past Medical History:  Diagnosis Date  . Diabetes mellitus without complication ([place])   . Ectopic pregnancy   . Essential hypertension 01/30/2016  . History of palpitations   . PAC (premature atrial contraction) 07/28/2016  . PVC (premature ventricular contraction)    hx of PVC's and PAC's  . SOB (shortness of breath) on exertion     Patient Active Problem List   Diagnosis Date Noted  . SOB (shortness of breath) on exertion   . PVC (premature ventricular contraction)   . History of palpitations   . Ectopic pregnancy   . Diabetes mellitus without complication ([place])   . PAC (premature atrial contraction) 07/28/2016  . Essential hypertension 01/30/2016  . Nausea without vomiting 09/14/2015  . GAD (generalized anxiety disorder) 09/14/2015  . Left knee pain 07/20/2015  . BPPV  (benign paroxysmal positional vertigo) 03/18/2015  . Diabetes mellitus type 2, controlled ([place]) 09/03/2013  . Palpitations 09/02/2013  . Hyperlipidemia 07/06/2010  . BUNIONS, BILATERAL 04/06/2010    Past Surgical History:  Procedure Laterality Date  . TUBAL LIGATION      Prior to Admission medications   Medication Sig Start Date End Date Taking? Authorizing Provider  atorvastatin (LIPITOR) 20 MG tablet TAKE 1 TABLET BY MOUTH EVERY DAY 08/19/20   Skeet Latch, MD  Blood Glucose Monitoring Suppl (ONE TOUCH ULTRA 2) w/Device KIT Use to test blood sugar three times daily. Dx: E11.9 08/18/18   Gildardo Cranker, DO  clonazePAM (KLONOPIN) 0.5 MG tablet Take 1 tablet (0.5 mg total) by mouth 2 (two) times daily as needed. for anxiety 12/06/17   Gildardo Cranker, DO  glucose blood (ONE TOUCH ULTRA TEST) test strip Use to test blood sugar three times daily. Dx:E11.9 08/18/18   Gildardo Cranker, DO  Lancets [facility] ULTRASOFT) lancets Use to check blood sugar three times daily. Dx:E11.9 08/18/18   Gildardo Cranker, DO  lisinopril (ZESTRIL) 10 MG tablet Take 10 mg by mouth daily. 05/26/20   [provider]  metFORMIN (GLUCOPHAGE) 500 MG tablet TAKE 1 TABLET BY MOUTH TWICE DAILY WITH A MEAL for diabetes 09/30/18   Lauree Chandler, NP  POLY-IRON 150 FORTE 150-25-1 MG-MCG-MG CAPS Take 1 capsule by mouth daily. 04/05/20   [provider]  STOOL SOFTENER 100 MG capsule Take 100 mg by mouth at bedtime. Patient not taking: Reported on 10/11/2020 02/29/20   [provider]  Tetrahydrozoline HCl (VISINE OP) Place 2 drops into both eyes as needed (for dry eyes). Patient not taking: Reported on 10/11/2020    [provider]  TURMERIC PO Take by mouth.    [provider]  Vitamin D, Ergocalciferol, (DRISDOL) 1.25 MG (50000 UNIT) CAPS capsule Take 50,000 Units by mouth once a week. 04/04/20   [provider]    Allergies Patient has no known allergies.  Family  History  Problem Relation Age of Onset  . Diabetes Mother   . Stroke Mother   . Diabetes Maternal Aunt   . Diabetes Paternal Grandmother   . Cancer Father     Social History Social History   Tobacco Use  . Smoking status: Never Smoker  . Smokeless tobacco: Never Used  Vaping Use  . Vaping Use: Never used  Substance Use Topics  . Alcohol use: No  . Drug use: No     Review of Systems  Constitutional: No fever/chills.  Reported episodes of hypotension Eyes: No visual changes. No discharge ENT: No upper respiratory complaints. Cardiovascular: no chest pain. Respiratory: no cough. No SOB. Gastrointestinal: No abdominal pain.  No nausea, no vomiting.  No diarrhea.  No constipation. Genitourinary: Negative for dysuria. No hematuria Musculoskeletal: Negative for musculoskeletal pain. Skin: Negative for rash, abrasions, lacerations, ecchymosis. Neurological: Negative for headaches, focal weakness or numbness.  10 System ROS otherwise negative.  ____________________________________________   PHYSICAL EXAM:  VITAL SIGNS: ED Triage Vitals  Enc Vitals Group     BP 10/29/20 1335 133/62     Pulse Rate 10/29/20 1335 72     Resp 10/29/20 1335 16     Temp 10/29/20 1335 98.8 F (37.1 C)     Temp Source 10/29/20 1335 Oral     SpO2 10/29/20 1335 100 %     Weight 10/29/20 1351 133 lb (60.3 kg)     Height 10/29/20 1351 _0  (1.575 m)     Head Circumference --      Peak Flow --      Pain Score 10/29/20 1351 0     Pain Loc --      Pain Edu? --      Excl. in [place]? --      Constitutional: Alert and oriented. Well appearing and in no acute distress. Eyes: Conjunctivae are normal. PERRL. EOMI. Head: Atraumatic. ENT:      Ears:       Nose: No congestion/rhinnorhea.      Mouth/Throat: Mucous membranes are moist.  Neck: No stridor.    Cardiovascular: Normal rate, regular rhythm. Normal S1 and S2.  Good peripheral circulation. Respiratory: Normal respiratory effort without  tachypnea or retractions. Lungs CTAB. Good air entry to the bases with no decreased or absent breath sounds. Musculoskeletal: Full range of motion to all extremities. No gross deformities appreciated. Neurologic:  Normal speech and language. No gross focal neurologic deficits are appreciated.  Skin:  Skin is warm, dry and intact. No rash noted. Psychiatric: Mood and affect are normal. Speech and behavior are normal. Patient exhibits appropriate insight and judgement.   ____________________________________________   LABS (all labs ordered are listed, but only abnormal results are displayed)  Labs Reviewed  CBC - Abnormal; Notable for the following components:      Result Value   Hemoglobin 11.4 (*)    HCT 35.6 (*)    All other components within normal limits  COMPREHENSIVE METABOLIC PANEL - Abnormal; Notable for the following components:   Glucose, Bld 128 (*)  Creatinine, Ser 1.14 (*)    GFR, Estimated 53 (*)    All other components within normal limits   ____________________________________________  EKG   ____________________________________________  RADIOLOGY   No results found.  ____________________________________________    PROCEDURES  Procedure(s) performed:    Procedures    Medications - No data to display   ____________________________________________   INITIAL IMPRESSION / ASSESSMENT AND PLAN / ED COURSE  Pertinent labs & imaging results that were available during my care of the patient were reviewed by me and considered in my medical decision making (see chart for details).  Review of the [place] CSRS was performed in accordance of the [place] prior to dispensing any controlled drugs.           Patient's diagnosis is consistent with hypertension.  Patient presents emergency department stating that she had had some episodes of hypotension at home over the past 2 weeks.  Patient was not hypotensive here in the emergency department, in fact she was  slightly hypertensive.  Patient had no associated complaints other than blood pressure readings.  Patient showed me her readings and the majority of her reported hypotension was diastolic numbers lower than normal.  Most of the diastolic findings were in the 50s through 70s.  Systolic readings were from the 100s to the 130s on average.  There were a few softer blood pressure readings with systolic readings.  Patient admits to not drinking enough fluid and have encouraged patient to drink her daily intake of fluids.  If patient continues to remained hypotensive, she should decrease her blood pressure medication.  If increasing fluid and decreasing blood pressure medication results and continued hypotension need to return to the emergency department or follow-up primary care..  Patient is given ED precautions to return to the ED for any worsening or new symptoms.     ____________________________________________  FINAL CLINICAL IMPRESSION(S) / ED DIAGNOSES  Final diagnoses:  Hypotension, unspecified hypotension type      NEW MEDICATIONS STARTED DURING THIS VISIT:  ED Discharge Orders    None          This chart was dictated using voice recognition software/Dragon. Despite best efforts to proofread, errors can occur which can change the meaning. Any change was purely unintentional.    Darletta Moll, PA-C 10/29/20 1811    Lucrezia Starch, MD 10/29/20 1919

## 2020-11-02 ENCOUNTER — Other Ambulatory Visit: Payer: Self-pay

## 2020-11-02 ENCOUNTER — Encounter: Payer: Self-pay | Admitting: Obstetrics and Gynecology

## 2020-11-02 ENCOUNTER — Ambulatory Visit (INDEPENDENT_AMBULATORY_CARE_PROVIDER_SITE_OTHER): Payer: Medicare Other | Admitting: Obstetrics and Gynecology

## 2020-11-02 VITALS — BP 139/77 | HR 72 | Ht 62.5 in | Wt 131.5 lb

## 2020-11-02 DIAGNOSIS — R351 Nocturia: Secondary | ICD-10-CM | POA: Diagnosis not present

## 2020-11-02 DIAGNOSIS — N816 Rectocele: Secondary | ICD-10-CM | POA: Diagnosis not present

## 2020-11-02 DIAGNOSIS — N8110 Cystocele, unspecified: Secondary | ICD-10-CM | POA: Diagnosis not present

## 2020-11-02 LAB — POCT URINALYSIS DIPSTICK
Appearance: NORMAL
Bilirubin, UA: NEGATIVE
Blood, UA: NEGATIVE
Glucose, UA: NEGATIVE
Ketones, UA: NEGATIVE
Leukocytes, UA: NEGATIVE
Nitrite, UA: NEGATIVE
Protein, UA: NEGATIVE
Spec Grav, UA: 1.02 (ref 1.010–1.025)
Urobilinogen, UA: 0.2 E.U./dL
pH, UA: 5.5 (ref 5.0–8.0)

## 2020-11-02 NOTE — Patient Instructions (Signed)
You have a stage 2 (out of 4) prolapse.  We discussed the fact that it is not life threatening but there are several treatment options. For treatment of pelvic organ prolapse, we discussed options for management including expectant management, conservative management, and surgical management, such as Kegels, a pessary, pelvic floor physical therapy, and specific surgical procedures.    

## 2020-11-03 NOTE — Progress Notes (Signed)
[facility]   Subjective:     Chief Complaint: Follow-up (pessary)  History of Present Illness: Michelle Robertson is a 69 y.o. female with stage II pelvic organ prolapse who presents today for a pessary fitting.   She is interested in trying a pessary for her prolapse.   Past Medical History: Patient  has a past medical history of Diabetes mellitus without complication ([place]), Ectopic pregnancy, Essential hypertension (01/30/2016), History of palpitations, PAC (premature atrial contraction) (07/28/2016), PVC (premature ventricular contraction), and SOB (shortness of breath) on exertion.   Past Surgical History: She  has a past surgical history that includes Tubal ligation.   Medications: She has a current medication list which includes the following prescription(s): one touch ultra 2, clonazepam, glucose blood, onetouch ultrasoft, lisinopril, metformin, poly-iron 150 forte, polyethylene glycol 400, turmeric, and vitamin d (ergocalciferol).   Allergies: Patient has No Known Allergies.   Social History: Patient  reports that she has never smoked. She has never used smokeless tobacco. She reports that she does not drink alcohol and does not use drugs.      Objective:    BP (!) 169/73   Pulse 71   Ht 5' 2.5" (1.588 m)   Wt 134 lb 8 oz (61 kg)   BMI 24.21 kg/m  Gen: No apparent distress, A&O x 3. Pelvic Exam: Normal external female genitalia; Bartholin's and Skene's glands normal in appearance; urethral meatus normal in appearance, no urethral masses or discharge.   Pessary fitting:  A size #3 ring with support (2-1/2in) pessary, [id number], Exp 06/18/22,  was fitted. It was comfortable, stayed in place with valsalva and was an appropriate size on examination, with one finger fitting between the pessary and the vaginal walls. We tied a string to it and the patient demonstrated proper removal and replacement.   Prior exam:   POP-Q  -1.5                                             Aa   -1.5                                           Ba  -6                                              C   4                                            Gh  4                                            Pb  8                                            tvl  0                                            Ap  0                                            Bp  -7                                              D       Assessment/Plan:    Assessment: Ms. Michelle Robertson is a 69 y.o. with stage II pelvic organ prolapse who presents for a pessary fitting. Plan: She was fitted with a size #3 ring with support pessary. She will remove weekly. She will use lubricant as needed- trimosan gel. She will follow-up in 1 month for a pessary check or sooner as needed.  All questions were answered.     [name], MD

## 2020-11-07 ENCOUNTER — Encounter: Payer: Self-pay | Admitting: Obstetrics and Gynecology

## 2020-11-07 ENCOUNTER — Other Ambulatory Visit: Payer: Self-pay

## 2020-11-07 ENCOUNTER — Ambulatory Visit (INDEPENDENT_AMBULATORY_CARE_PROVIDER_SITE_OTHER): Payer: Medicare Other | Admitting: Obstetrics and Gynecology

## 2020-11-07 VITALS — BP 169/73 | HR 71 | Ht 62.5 in | Wt 134.5 lb

## 2020-11-07 DIAGNOSIS — N816 Rectocele: Secondary | ICD-10-CM

## 2020-11-07 DIAGNOSIS — N8110 Cystocele, unspecified: Secondary | ICD-10-CM | POA: Diagnosis not present

## 2020-11-07 NOTE — Patient Instructions (Signed)
You were fitted with a #3 ring with support pessary.  Come back in 4 weeks to see how it is working.  You can take it out every week, or sooner if you desire. Clean with liquid soap and water in between uses and leave out over night on the night that you remove it. Call for any problems.

## 2020-12-08 ENCOUNTER — Ambulatory Visit: Payer: Medicare Other | Admitting: Obstetrics and Gynecology

## 2020-12-09 NOTE — Progress Notes (Deleted)
[facility]   Subjective:     Chief Complaint: No chief complaint on file.  History of Present Illness: Michelle Robertson is a 70 y.o. female with stage II pelvic organ prolapse who presents for a pessary check. She is using a size #3 ring with support pessary. The pessary has been working well and she has no complaints. She is using trimosan gel as needed and is removing the pessary weekly. Denies vaginal bleeding or discharge.   Past Medical History: Patient  has a past medical history of Diabetes mellitus without complication ([place]), Ectopic pregnancy, Essential hypertension (01/30/2016), History of palpitations, PAC (premature atrial contraction) (07/28/2016), PVC (premature ventricular contraction), and SOB (shortness of breath) on exertion.   Past Surgical History: She  has a past surgical history that includes Tubal ligation.   Medications: She has a current medication list which includes the following prescription(s): one touch ultra 2, clonazepam, glucose blood, onetouch ultrasoft, lisinopril, metformin, poly-iron 150 forte, polyethylene glycol 400, turmeric, and vitamin d (ergocalciferol).   Allergies: Patient has No Known Allergies.   Social History: Patient  reports that she has never smoked. She has never used smokeless tobacco. She reports that she does not drink alcohol and does not use drugs.      Objective:    Physical Exam: There were no vitals taken for this visit. Gen: No apparent distress, A&O x 3. Detailed Urogynecologic Evaluation:  Pelvic Exam: Normal external female genitalia; Bartholin's and Skene's glands normal in appearance; urethral meatus {urethra:24773}, no urethral masses or discharge. The pessary was noted to be {in place:24774}. It was removed and cleaned. Speculum exam revealed {vaginal lesions:24775} in the vagina. The pessary was replaced. It was comfortable to the patient and fit well.   Prior exam (11/02/20):    POP-Q  -1.5 Aa  -1.5 Ba  -6 C   4 Gh  4 Pb  8 tvl   0 Ap  0 Bp  -7 D      Laboratory Results: Urine dipstick shows: {ua dip:315374::"negative for all components"}.    Assessment/Plan:    Assessment: Michelle Robertson is a 70 y.o. with stage II pelvic organ prolapse here for a pessary check. She is doing well.  Plan: She will {pessary plan:24776}. She will continue to use {lubricant:24777}. She will follow-up in *** {days/wks/mos/yrs:310907} for a pessary check or sooner as needed.  All questions were answered.   Time Spent:

## 2020-12-14 ENCOUNTER — Ambulatory Visit: Payer: Medicare Other | Admitting: Obstetrics and Gynecology

## 2020-12-15 NOTE — Progress Notes (Signed)
[facility]   Subjective:     Chief Complaint:  Chief Complaint  Patient presents with  . Pessary f/u   History of Present Illness: Micala Saltsman is a 70 y.o. female with stage II pelvic organ prolapse who presents for a pessary check. She is using a size #3 ring with support pessary. The pessary has been working well and she is very happy with it. Has been removing it weekly. Sometimes has noticed increased odor and discharge.  She is not using vaginal estrogen- using trimosan gel as needed. Denies vaginal bleeding.  Past Medical History: Patient  has a past medical history of Diabetes mellitus without complication ([place]), Ectopic pregnancy, Essential hypertension (01/30/2016), History of palpitations, PAC (premature atrial contraction) (07/28/2016), PVC (premature ventricular contraction), and SOB (shortness of breath) on exertion.   Past Surgical History: She  has a past surgical history that includes Tubal ligation.   Medications: She has a current medication list which includes the following prescription(s): one touch ultra 2, clonazepam, glucose blood, onetouch ultrasoft, lisinopril, metformin, poly-iron 150 forte, polyethylene glycol 400, turmeric, and vitamin d (ergocalciferol).   Allergies: Patient has No Known Allergies.   Social History: Patient  reports that she has never smoked. She has never used smokeless tobacco. She reports that she does not drink alcohol and does not use drugs.      Objective:    Physical Exam: BP (!) 149/76   Pulse 71   Wt 134 lb (60.8 kg)   BMI 24.12 kg/m  Gen: No apparent distress, A&O x 3. Detailed Urogynecologic Evaluation:  Pelvic Exam: Normal external female genitalia; Bartholin's and Skene's glands normal in appearance; urethral meatus normal in appearance, no urethral masses or discharge. The pessary was noted to be in place. It was removed and cleaned. Speculum exam revealed no lesions in the vagina. The pessary was  replaced. It was comfortable to the patient and fit well.   Prior exam (11/02/20):   POP-Q  -1.5 Aa  -1.5 Ba  -6 C   4 Gh  4 Pb  8 tvl   0 Ap  0 Bp  -7 D        Assessment/Plan:    Assessment: Ms. Harder is a 70 y.o. with stage II pelvic organ prolapse here for a pessary check. She is doing well.  Plan: She is using a size #3 ring with support pessary.  She will remove weekly and keep out overnight when she removes. Discussed that the pessary can increase the amount of vaginal discharge.   Follow-up in 4 months for a pessary check or sooner as needed.  All questions were answered.  [name], MD   Time Spent: I spent 20 minutes dedicated to the care of this patient on the date of this encounter to include pre-visit review of records, face-to-face time with the patient and post visit documentation.

## 2020-12-20 ENCOUNTER — Ambulatory Visit (INDEPENDENT_AMBULATORY_CARE_PROVIDER_SITE_OTHER): Payer: Medicare Other | Admitting: Obstetrics and Gynecology

## 2020-12-20 ENCOUNTER — Other Ambulatory Visit: Payer: Self-pay

## 2020-12-20 ENCOUNTER — Encounter: Payer: Self-pay | Admitting: Obstetrics and Gynecology

## 2020-12-20 VITALS — BP 149/76 | HR 71 | Wt 134.0 lb

## 2020-12-20 DIAGNOSIS — N8110 Cystocele, unspecified: Secondary | ICD-10-CM

## 2020-12-20 DIAGNOSIS — N816 Rectocele: Secondary | ICD-10-CM

## 2021-02-20 ENCOUNTER — Other Ambulatory Visit: Payer: Self-pay | Admitting: Cardiovascular Disease

## 2021-02-24 ENCOUNTER — Encounter: Payer: Self-pay | Admitting: *Deleted

## 2021-03-22 ENCOUNTER — Encounter: Payer: Self-pay | Admitting: Obstetrics and Gynecology

## 2021-03-22 ENCOUNTER — Ambulatory Visit (INDEPENDENT_AMBULATORY_CARE_PROVIDER_SITE_OTHER): Payer: Medicare Other | Admitting: Obstetrics and Gynecology

## 2021-03-22 ENCOUNTER — Other Ambulatory Visit: Payer: Self-pay

## 2021-03-22 VITALS — BP 117/65 | HR 80 | Ht 62.5 in | Wt 134.0 lb

## 2021-03-22 DIAGNOSIS — N816 Rectocele: Secondary | ICD-10-CM | POA: Diagnosis not present

## 2021-03-22 DIAGNOSIS — N8110 Cystocele, unspecified: Secondary | ICD-10-CM

## 2021-03-22 NOTE — Progress Notes (Signed)
[facility]   Subjective:     Chief Complaint:  Chief Complaint  Patient presents with  . Follow-up    pessary   History of Present Illness: Michelle Robertson is a 70 y.o. female with stage II pelvic organ prolapse who presents for a pessary check. She is using a size #3 ring with support pessary. The pessary has been working well but she complains of discharge and odor. Has been removing it weekly and sometimes leaves it out for a few days. She uses the trimosan jelly with insertion but not daily. She can easily insert it but sometimes has difficulty removing so is unsure about removing it more often.   Past Medical History: Patient  has a past medical history of Diabetes mellitus without complication ([place]), Ectopic pregnancy, Essential hypertension (01/30/2016), History of palpitations, PAC (premature atrial contraction) (07/28/2016), PVC (premature ventricular contraction), and SOB (shortness of breath) on exertion.   Past Surgical History: She  has a past surgical history that includes Tubal ligation.   Medications: She has a current medication list which includes the following prescription(s): one touch ultra 2, clonazepam, glucose blood, onetouch ultrasoft, lisinopril, metformin, poly-iron 150 forte, polyethylene glycol 400, turmeric, and vitamin d (ergocalciferol).   Allergies: Patient has No Known Allergies.   Social History: Patient  reports that she has never smoked. She has never used smokeless tobacco. She reports that she does not drink alcohol and does not use drugs.      Objective:    Physical Exam: BP 117/65   Pulse 80   Ht 5' 2.5" (1.588 m)   Wt 134 lb (60.8 kg)   BMI 24.12 kg/m  Gen: No apparent distress, A&O x 3.  Detailed Urogynecologic Evaluation:  Pelvic Exam: Normal external female genitalia; Bartholin's and Skene's glands normal in appearance; urethral meatus normal in appearance, no urethral masses or discharge. The pessary was noted to be  in place. It was removed and cleaned. Speculum exam revealed no lesions in the vagina. The pessary was replaced. It was comfortable to the patient and fit well. A string was tied to the pessary to help with removal prior to insertion.   Prior exam (11/02/20):   POP-Q  -1.5 Aa  -1.5 Ba  -6 C   4 Gh  4 Pb  8 tvl   0 Ap  0 Bp  -7 D        Assessment/Plan:    Assessment: Michelle Robertson is a 70 y.o. with stage II pelvic organ prolapse here for a pessary check.   Plan: - She is using a size #3 ring with support pessary.  - Continue to remove at least weekly. Discussed using trimosan jelly daily to help with vaginal discharge. She can also try to remove more frequently. Tied a string to the pessary today to help with removal.  - We also discussed the option for surgery (posterior repair, possible anterior repair). She is not sure she is ready to proceed at this time but will consider it as an option. Handout provided about the surgery.   Follow-up in 6 months for a pessary check or sooner as needed.  All questions were answered.  [name], MD   Time Spent: I spent 25 minutes dedicated to the care of this patient on the date of this encounter to include pre-visit review of records, face-to-face time with the patient and post visit documentation.

## 2021-06-19 DIAGNOSIS — Z8616 Personal history of COVID-19: Secondary | ICD-10-CM

## 2021-06-19 HISTORY — DX: Personal history of COVID-19: Z86.16

## 2021-08-28 ENCOUNTER — Other Ambulatory Visit: Payer: Self-pay | Admitting: Registered Nurse

## 2021-08-28 ENCOUNTER — Ambulatory Visit
Admission: RE | Admit: 2021-08-28 | Discharge: 2021-08-28 | Disposition: A | Discharge status: Home / Self Care | Payer: Medicare Other | Source: Ambulatory Visit | Attending: Registered Nurse | Admitting: Registered Nurse

## 2021-08-28 DIAGNOSIS — M25642 Stiffness of left hand, not elsewhere classified: Secondary | ICD-10-CM

## 2021-08-28 DIAGNOSIS — M79645 Pain in left finger(s): Secondary | ICD-10-CM

## 2021-08-28 DIAGNOSIS — M79642 Pain in left hand: Secondary | ICD-10-CM

## 2021-09-09 ENCOUNTER — Emergency Department: Payer: Medicare Other

## 2021-09-09 ENCOUNTER — Other Ambulatory Visit: Payer: Self-pay

## 2021-09-09 ENCOUNTER — Emergency Department
Admission: EM | Admit: 2021-09-09 | Discharge: 2021-09-09 | Disposition: A | Discharge status: Home / Self Care | Payer: Medicare Other | Attending: Emergency Medicine | Admitting: Emergency Medicine

## 2021-09-09 DIAGNOSIS — E119 Type 2 diabetes mellitus without complications: Secondary | ICD-10-CM | POA: Insufficient documentation

## 2021-09-09 DIAGNOSIS — I10 Essential (primary) hypertension: Secondary | ICD-10-CM | POA: Diagnosis not present

## 2021-09-09 DIAGNOSIS — Z7984 Long term (current) use of oral hypoglycemic drugs: Secondary | ICD-10-CM | POA: Diagnosis not present

## 2021-09-09 DIAGNOSIS — Z20822 Contact with and (suspected) exposure to covid-19: Secondary | ICD-10-CM | POA: Insufficient documentation

## 2021-09-09 DIAGNOSIS — R079 Chest pain, unspecified: Secondary | ICD-10-CM | POA: Diagnosis present

## 2021-09-09 LAB — COMPREHENSIVE METABOLIC PANEL
ALT: 15 U/L (ref 0–44)
AST: 24 U/L (ref 15–41)
Albumin: 4.7 g/dL (ref 3.5–5.0)
Alkaline Phosphatase: 65 U/L (ref 38–126)
Anion gap: 10 (ref 5–15)
BUN: 17 mg/dL (ref 8–23)
CO2: 25 mmol/L (ref 22–32)
Calcium: 9.9 mg/dL (ref 8.9–10.3)
Chloride: 105 mmol/L (ref 98–111)
Creatinine, Ser: 1.15 mg/dL — ABNORMAL HIGH (ref 0.44–1.00)
GFR, Estimated: 52 mL/min — ABNORMAL LOW (ref >60)
Glucose, Bld: 114 mg/dL — ABNORMAL HIGH (ref 70–99)
Potassium: 4.1 mmol/L (ref 3.5–5.1)
Sodium: 140 mmol/L (ref 135–145)
Total Bilirubin: 0.5 mg/dL (ref 0.3–1.2)
Total Protein: 7.9 g/dL (ref 6.5–8.1)

## 2021-09-09 LAB — CBC
HCT: 35 % — ABNORMAL LOW (ref 36.0–46.0)
Hemoglobin: 11.5 g/dL — ABNORMAL LOW (ref 12.0–15.0)
MCH: 29 pg (ref 26.0–34.0)
MCHC: 32.9 g/dL (ref 30.0–36.0)
MCV: 88.2 fL (ref 80.0–100.0)
Platelets: 283 10*3/uL (ref 150–400)
RBC: 3.97 MIL/uL (ref 3.87–5.11)
RDW: 13.7 % (ref 11.5–15.5)
WBC: 8.3 10*3/uL (ref 4.0–10.5)
nRBC: 0 % (ref 0.0–0.2)

## 2021-09-09 LAB — D-DIMER, QUANTITATIVE: D-Dimer, Quant: 0.56 ug/mL-FEU — ABNORMAL HIGH (ref 0.00–0.50)

## 2021-09-09 LAB — TROPONIN I (HIGH SENSITIVITY)
Troponin I (High Sensitivity): 3 ng/L (ref <18)
Troponin I (High Sensitivity): 4 ng/L (ref <18)

## 2021-09-09 LAB — RESP PANEL BY RT-PCR (FLU A&B, COVID) ARPGX2
Influenza A by PCR: NEGATIVE
Influenza B by PCR: NEGATIVE
SARS Coronavirus 2 by RT PCR: NEGATIVE

## 2021-09-09 MED ORDER — IOHEXOL 350 MG/ML SOLN
75.0000 mL | Freq: Once | INTRAVENOUS | Status: AC | PRN
  Administered 2021-09-09: 75 mL via INTRAVENOUS

## 2021-09-09 NOTE — ED Provider Notes (Signed)
[facility] Emergency Department Provider Note  ____________________________________________   Event Date/Time   First MD Initiated Contact with Patient 09/09/21 1630     (approximate)  I have reviewed the triage vital signs and the nursing notes.   HISTORY  Chief Complaint Chest Pain    HPI Michelle Robertson is a 70 y.o. female with diabetes, history of PVCs, PACs, hypertension who comes in with chest pain.  Patient reports that the chest pain started 2 weeks ago.  Patient states that she has had 2 weeks of intermittent episodes where she had sharp sensation into her left chest, nothing makes it better, nothing makes it worse.  She states that they can happen anytime of the day.  Do not have to be exertional in nature.  She states that she has a little bit of shortness of breath associated with it but she is had that for a much longer amount of time but now that she is having the chest pain plus the shortness of breath she wanted to come get evaluated.  Denies any unilateral leg swelling.  Patient does follow with cardiology Dr. Oval Linsey.  She has an appointment next month.          Past Medical History:  Diagnosis Date   Diabetes mellitus without complication ([place])    Ectopic pregnancy    Essential hypertension 01/30/2016   History of palpitations    PAC (premature atrial contraction) 07/28/2016   PVC (premature ventricular contraction)    hx of PVC's and PAC's   SOB (shortness of breath) on exertion     Patient Active Problem List   Diagnosis Date Noted   SOB (shortness of breath) on exertion    PVC (premature ventricular contraction)    History of palpitations    Ectopic pregnancy    Diabetes mellitus without complication ([facility])    PAC (premature atrial contraction) 07/28/2016   Essential hypertension 01/30/2016   Nausea without vomiting 09/14/2015   GAD (generalized anxiety disorder) 09/14/2015   Left knee pain 07/20/2015   BPPV (benign  paroxysmal positional vertigo) 03/18/2015   Diabetes mellitus type 2, controlled ([employer]) 09/03/2013   Palpitations 09/02/2013   Hyperlipidemia 07/06/2010   BUNIONS, BILATERAL 04/06/2010    Past Surgical History:  Procedure Laterality Date   TUBAL LIGATION      Prior to Admission medications   Medication Sig Start Date End Date Taking? Authorizing Provider  Blood Glucose Monitoring Suppl (ONE TOUCH ULTRA 2) w/Device KIT Use to test blood sugar three times daily. Dx: E11.9 08/18/18   Gildardo Cranker, DO  clonazePAM (KLONOPIN) 0.5 MG tablet Take 1 tablet (0.5 mg total) by mouth 2 (two) times daily as needed. for anxiety 12/06/17   Gildardo Cranker, DO  glucose blood (ONE TOUCH ULTRA TEST) test strip Use to test blood sugar three times daily. Dx:E11.9 08/18/18   Gildardo Cranker, DO  Lancets [facility] ULTRASOFT) lancets Use to check blood sugar three times daily. Dx:E11.9 08/18/18   Gildardo Cranker, DO  lisinopril (ZESTRIL) 10 MG tablet Take 10 mg by mouth daily. 05/26/20   [provider]  metFORMIN (GLUCOPHAGE) 500 MG tablet TAKE 1 TABLET BY MOUTH TWICE DAILY WITH A MEAL for diabetes 09/30/18   Lauree Chandler, NP  POLY-IRON 150 FORTE 150-25-1 MG-MCG-MG CAPS Take 1 capsule by mouth daily. 04/05/20   [provider]  Polyethylene Glycol 400 (BLINK TEARS OP) Apply to eye.    [provider]  TURMERIC PO Take by mouth.  [provider]  Vitamin D, Ergocalciferol, (DRISDOL) 1.25 MG (50000 UNIT) CAPS capsule Take 50,000 Units by mouth once a week. 04/04/20   [provider]    Allergies Patient has no known allergies.  Family History  Problem Relation Age of Onset   Diabetes Mother    Stroke Mother    Diabetes Maternal Aunt    Diabetes Paternal Grandmother    Cancer Father     Social History Social History   Tobacco Use   Smoking status: Never   Smokeless tobacco: Never  Vaping Use   Vaping Use: Never used  Substance Use Topics   Alcohol use:  No   Drug use: No      Review of Systems Constitutional: No fever/chills Eyes: No visual changes. ENT: No sore throat. Cardiovascular: Positive chest pain Respiratory: + sob  Gastrointestinal: No abdominal pain.  No nausea, no vomiting.  No diarrhea.  No constipation. Genitourinary: Negative for dysuria. Musculoskeletal: Negative for back pain. Skin: Negative for rash. Neurological: Negative for headaches, focal weakness or numbness. All other ROS negative ____________________________________________   PHYSICAL EXAM:  VITAL SIGNS: ED Triage Vitals [09/09/21 1502]  Enc Vitals Group     BP (!) 175/81     Pulse Rate 85     Resp 18     Temp 98 F (36.7 C)     Temp Source Oral     SpO2 98 %     Weight 131 lb (59.4 kg)     Height '5\' 2"'  (1.575 m)     Head Circumference      Peak Flow      Pain Score 0     Pain Loc      Pain Edu?      Excl. in [place]?     Constitutional: Alert and oriented. Well appearing and in no acute distress. Eyes: Conjunctivae are normal. EOMI. Head: Atraumatic. Nose: No congestion/rhinnorhea. Mouth/Throat: Mucous membranes are moist.   Neck: No stridor. Trachea Midline. FROM Cardiovascular: Normal rate, regular rhythm. Grossly normal heart sounds.  Good peripheral circulation. Respiratory: Normal respiratory effort.  No retractions. Lungs CTAB. Gastrointestinal: Soft and nontender. No distention. No abdominal bruits.  Musculoskeletal: No lower extremity tenderness nor edema.  No joint effusions. Neurologic:  Normal speech and language. No gross focal neurologic deficits are appreciated.  Skin:  Skin is warm, dry and intact. No rash noted. Psychiatric: Mood and affect are normal. Speech and behavior are normal. GU: Deferred   ____________________________________________   LABS (all labs ordered are listed, but only abnormal results are displayed)  Labs Reviewed  CBC - Abnormal; Notable for the following components:      Result Value    Hemoglobin 11.5 (*)    HCT 35.0 (*)    All other components within normal limits  COMPREHENSIVE METABOLIC PANEL - Abnormal; Notable for the following components:   Glucose, Bld 114 (*)    Creatinine, Ser 1.15 (*)    GFR, Estimated 52 (*)    All other components within normal limits  TROPONIN I (HIGH SENSITIVITY)  TROPONIN I (HIGH SENSITIVITY)   ____________________________________________   ED ECG REPORT I, Vanessa Franklin, the attending physician, personally viewed and interpreted this ECG.  Normal sinus rate of 90 without any ST elevation but does have some frequent PVCs, otherwise normal intervals ____________________________________________  RADIOLOGY Robert Bellow, personally viewed and evaluated these images (plain radiographs) as part of my medical decision making, as well as reviewing the written report  by the radiologist.  ED MD interpretation: No pneumonia  Official radiology report(s): DG Chest 2 View  Result Date: 09/09/2021 CLINICAL DATA:  Chest pain with nausea for 2 weeks, worse today. History of hypertension, diabetes and palpitations. EXAM: CHEST - 2 VIEW COMPARISON:  Radiographs 01/27/2016.  CT 11/12/2012. FINDINGS: The heart size and mediastinal contours are stable with aortic atherosclerosis. The lungs appear clear. There is no pleural effusion or pneumothorax. Mild degenerative changes are present within the spine. No acute osseous findings are evident. IMPRESSION: Stable chest.  No evidence of active cardiopulmonary process. Electronically Signed   By: Richardean Sale M.D.   On: 09/09/2021 16:26    ____________________________________________   PROCEDURES  Procedure(s) performed (including Critical Care):  Procedures   ____________________________________________   INITIAL IMPRESSION / ASSESSMENT AND PLAN / ED COURSE   Kamiyah Lalley was evaluated in Emergency Department on 09/09/2021 for the symptoms described in the history of present illness. She  was evaluated in the context of the global COVID-19 pandemic, which necessitated consideration that the patient might be at risk for infection with the SARS-CoV-2 virus that causes COVID-19. Institutional protocols and algorithms that pertain to the evaluation of patients at risk for COVID-19 are in a state of rapid change based on information released by regulatory bodies including the CDC and federal and state organizations. These policies and algorithms were followed during the patient's care in the ED.    Most Likely DDx:  -MSK (atypical chest pain) but will get cardiac markers to evaluate for ACS given risk factors/age -PE will get ddimer given SOB    DDx that was also considered d/t potential to cause harm, but was found less likely based on history and physical (as detailed above): -PNA (no fevers, cough but CXR to evaluate) -PNX (reassured with equal b/l breath sounds, CXR to evaluate) -Symptomatic anemia (will get H&H) -Aortic Dissection as no tearing pain and no radiation to the mid back, pulses equal -Pericarditis no rub on exam, EKG changes or hx to suggest dx -Tamponade (no notable SOB, tachycardic, hypotensive) -Esophageal rupture (no h/o diffuse vomitting/no crepitus)   Kidney function is at baseline hemoglobin is at baseline initial cardiac marker is negative  Repeat cardiac marker was negative.  D-dimer was slightly elevated so I proceeded with CT PE this was negative.  Patient told the nurse that she was having some right leg pain.  This was almost a week ago and she has no pain at this time.  She states that she was moving her foot involuntary over the bed.  I am not quite sure what this means but she states that it did feel weak for 2 days.  She is neurologically intact at this time.  Stroke scale is 0.  I am not sure this could have been an TIA but unable to get CT head due to just having contrast.  I will give her neurology follow-up and I explained that if she develops  return of symptoms that she is return to the ER immediately to be evaluated for stroke.  She expressed understanding.  She is got good distal pulse unlikely arterial occlusion.  She has not no calf tenderness or leg swelling or continued pain to suggest DVT  Patient's chest pain sounds atypical in nature.  We discussed observation admission versus going home and following up outpatient.  Given his atypical sounding chest pain and that she has a cardiologist she can follow-up with she is comfortable following up with her cardiologist outpatient.  She understands that if she develops worsening symptoms that she needs to return to the ER immediately for repeat evaluation.  Her daughter is at bedside and agrees with this plan  I discussed the provisional nature of ED diagnosis, the treatment so far, the ongoing plan of care, follow up appointments and return precautions with the patient and any family or support people present. They expressed understanding and agreed with the plan, discharged home.        ____________________________________________   FINAL CLINICAL IMPRESSION(S) / ED DIAGNOSES   Final diagnoses:  Chest pain     MEDICATIONS GIVEN DURING THIS VISIT:  Medications  iohexol (OMNIPAQUE) 350 MG/ML injection 75 mL (75 mLs Intravenous Contrast Given 09/09/21 1823)     ED Discharge Orders     None        Note:  This document was prepared using Dragon voice recognition software and may include unintentional dictation errors.    Vanessa [place], MD 09/09/21 [phone]

## 2021-09-09 NOTE — ED Notes (Signed)
Walking SPO2 obtained. SPO2 remained 95-100% with ambulation. Pt on room air.

## 2021-09-09 NOTE — ED Notes (Signed)
Pt wanted to tell nurse that she forgot to tell MD that several days ago, (mon, tue) pt had "pain and heaviness" in R leg from groin to foot and leg was moving laterally across bed involuntarily. Pain was dull. It resolved after about 2 days. Communicated to EDP.

## 2021-09-09 NOTE — Discharge Instructions (Signed)
Your cardiac work-up was reassuring but given you are having the chest pain you should call your cardiologist on Monday state you need an ER follow-up for next week.  If your chest pain is changing in any way you develop worsening symptoms or any other concerns please return to the ER immediately.  Not quite sure what to make of the right leg movement but if you develop weakness or numbness in your legs or arms return to the ER immediately otherwise have given you neurology number you could call them or he can discuss it further with your primary care doctor

## 2021-09-09 NOTE — ED Triage Notes (Signed)
Pt comes pov with chest pain for about 2 weeks off and on. States increased fatigue, palpitations. Pt having frequent PVCs on EKG. Pt states it's a deep pain in her left breast into her back. Nothing makes it better or worse.

## 2021-09-09 NOTE — ED Notes (Signed)
Dr Jari Pigg at bedside examining pt. See triage note. Pt has been experiencing intermittent episodes of L chest and back pain, sharp and stabbing, with palpitations and SOB since 2 weeks. Pt ambulatory to ED room, in NAD at this time. States has 2-3 episodes per day with last episode about 30 minutes ago. Denies recent travel, surgeries, estrogen use.  Pt has appt with cardiologist in 1 month. Denies leg swelling. Denies smoking at all.

## 2021-09-09 NOTE — ED Notes (Signed)
Pt in CT.

## 2021-09-09 NOTE — ED Provider Notes (Signed)
Emergency Medicine Provider Triage Evaluation Note  Michelle Robertson , a 70 y.o. female  was evaluated in triage.  Pt complains of chest pain with nausea. Pain started 2 weeks ago but seems worse today. History of palpitations, otherwise no cardiac.  Review of Systems  Positive: Chest pain Negative: Shortness of breath  Physical Exam  There were no vitals taken for this visit. Gen:   Awake, no distress   Resp:  Normal effort  MSK:   Moves extremities without difficulty  Other:    Medical Decision Making  Medically screening exam initiated at 2:57 PM.  Appropriate orders placed.  Michelle Robertson was informed that the remainder of the evaluation will be completed by another provider, this initial triage assessment does not replace that evaluation, and the importance of remaining in the ED until their evaluation is complete.   Victorino Dike, FNP 09/09/21 1501    Naaman Plummer, MD 09/10/21 1235

## 2021-09-14 ENCOUNTER — Ambulatory Visit (INDEPENDENT_AMBULATORY_CARE_PROVIDER_SITE_OTHER): Payer: Medicare Other | Admitting: Cardiovascular Disease

## 2021-09-14 ENCOUNTER — Other Ambulatory Visit: Payer: Self-pay

## 2021-09-14 DIAGNOSIS — R0602 Shortness of breath: Secondary | ICD-10-CM

## 2021-09-14 DIAGNOSIS — E7800 Pure hypercholesterolemia, unspecified: Secondary | ICD-10-CM | POA: Diagnosis not present

## 2021-09-14 DIAGNOSIS — I10 Essential (primary) hypertension: Secondary | ICD-10-CM

## 2021-09-14 MED ORDER — LISINOPRIL 5 MG PO TABS: 5.0000 mg | ORAL_TABLET | Freq: Every day | ORAL | 3 refills | Status: DC

## 2021-09-14 NOTE — Progress Notes (Signed)
Cardiology Office Note  Date:  10/04/2021   ID:  Michelle Robertson, DOB Apr 04, 1951, MRN [id number]  PCP:  Michelle Robertson., FNP  Cardiologist:   Michelle Latch, MD   No chief complaint on file.  Patient ID: Michelle Robertson is a 70 y.o. female with diabetes mellitus type 2, PACs, PVCs and hyperlipidemia who presents for follow up.  Michelle Robertson was seen in the ED on 01/2016 with palpitations.  She reported intermittent episodes of nausea and palpitations.  It was associated with shortness of breath, nausea and occasionally dizziness.   In the ED EKG revealed sinus rhythm with occasional PVCs.  Labs revealed mild anemia that was stable and no electrolyte abnormalities.  Michelle Robertson had an echo in 2014 that revealed LVEF 65-70% and was otherwise unremarkable.  She was started on metoprolol 64m daily, which helped.   She wore a 7 day event monitor that showed occasional PACs.  At her last appointment Michelle Robertson' blood pressure was poorly controlled and she had frequent palpitations.  Therefore metoprolol was increased.  She was also started on rosuvastatin.   Michelle Robertson husband passed away 8/201.  [place]  [place] she was settling his estate and was under a lot of stress.  She had some episodes of high blood pressure and palpitations.      At the last appointment her blood pressure was elevated but she had not been taking her medicine consistently. She was seen in the ED last week for chest pain. Her episodes were not exertional, and thought to be musculoskeletal. Cardiac enzymes were negative, but D-dimer was mildly elevated. She had a chest CT that was negative for PE. There was calcification of the aorta but no coronary calcification.   Today she says that she has been experiencing chest pains and feels fatigued constantly. She woke up with pain and weakness in her right LE two Sundays ago. She describes this pain as dull, and notes feeling like she could not stand up  but was able to do so. While sleeping in bed, she also felt a "deep stabbing" in her left chest radiating through to her back. She does not participate in much formal exercise. She does climb 14 stairs at home several times each day, which may or may not cause her LE pain. She denies any palpitations, lightheadedness, headaches, syncope, orthopnea, or PND.    Past Medical History:  Diagnosis Date   Diabetes mellitus without complication ([place]    Ectopic pregnancy    Essential hypertension 01/30/2016   History of palpitations    PAC (premature atrial contraction) 07/28/2016   PVC (premature ventricular contraction)    hx of PVC's and PAC's   SOB (shortness of breath) on exertion     Past Surgical History:  Procedure Laterality Date   TUBAL LIGATION      Current Outpatient Medications  Medication Sig Dispense Refill   Blood Glucose Monitoring Suppl (ONE TOUCH ULTRA 2) w/Device KIT Use to test blood sugar three times daily. Dx: E11.9 100 each 11   Cholecalciferol (VITAMIN D) 50 MCG (2000 UT) CAPS Take 1 capsule by mouth daily.     clonazePAM (KLONOPIN) 0.5 MG tablet Take 1 tablet (0.5 mg total) by mouth 2 (two) times daily as needed. for anxiety 60 tablet 0   glucose blood (ONE TOUCH ULTRA TEST) test strip Use to test blood sugar three times daily. Dx:E11.9 100 each 11   Lancets (ONETOUCH  ULTRASOFT) lancets Use to check blood sugar three times daily. Dx:E11.9 100 each 11   metFORMIN (GLUCOPHAGE) 500 MG tablet TAKE 1 TABLET BY MOUTH TWICE DAILY WITH A MEAL for diabetes 180 tablet 1   POLY-IRON 150 FORTE 150-25-1 MG-MCG-MG CAPS Take 1 capsule by mouth daily.     Polyethylene Glycol 400 (BLINK TEARS OP) Apply to eye.     TURMERIC PO Take by mouth.     atorvastatin (LIPITOR) 20 MG tablet Take 20 mg by mouth daily.     lisinopril (ZESTRIL) 5 MG tablet Take 1 tablet (5 mg total) by mouth daily. 90 tablet 3   No current facility-administered medications for this visit.    Allergies:   Patient  has no known allergies.    Social History:  The patient  reports that she has never smoked. She has never used smokeless tobacco. She reports that she does not drink alcohol and does not use drugs.   Family History:  The patient's family history includes Cancer in her father; Diabetes in her maternal aunt, mother, and paternal grandmother; Stroke in her mother.    ROS:   Please see the history of present illness. (+) Right LE weakness, heaviness, and pain (+) Fatigue (+) Chest pain (+) acid reflux (+) Back pain All other systems are reviewed and negative.     PHYSICAL EXAM: VS:  BP (!) 168/70 (BP Location: Left Arm, Patient Position: Sitting, Cuff Size: Normal)   Pulse 79   Ht '5\' 2"'  (1.575 m)   Wt 132 lb 1.6 oz (59.9 kg)   SpO2 99%   BMI 24.16 kg/m  , BMI Body mass index is 24.16 kg/m. GENERAL:  Well appearing HEENT: Pupils equal round and reactive, fundi not visualized, oral mucosa unremarkable NECK:  No jugular venous distention, waveform within normal limits, carotid upstroke brisk and symmetric, no bruits, no thyromegaly LUNGS:  Clear to auscultation bilaterally HEART:  RRR.  PMI not displaced or sustained,S1 and S2 within normal limits, no S3, no S4, no clicks, no rubs, no murmurs ABD:  Flat, positive bowel sounds normal in frequency in pitch, no bruits, no rebound, no guarding, no midline pulsatile mass, no hepatomegaly, no splenomegaly EXT:  2 plus pulses throughout, no edema, no cyanosis no clubbing SKIN:  No rashes no nodules NEURO:  Cranial nerves II through XII grossly intact, motor grossly intact throughout PSYCH:  Cognitively intact, oriented to person place and time    EKG:   03/12/16: Sinus bradycardia.  Rate 59 bpm.   12/03/17: Sinus rhythm.  Rate 63 bpm.   07/08/20: Sinus rhythm.  Rate 71 bpm.  Nonspecific T wave abnormality. 09/14/2021: EKG was not ordered today.  7 Day Event Monitor 03/20/16:   Quality: Fair.  Baseline artifact. Predominant rhythm: sinus  rhythm, sinus bradycardia Average heart rate: 70 bpm Max heart rate: 121 bpm Min heart rate: 47 bpm Pauses >2.5 seconds: 0   Occasional PACs.  Recent Labs: 09/09/2021: ALT 15; BUN 17; Creatinine, Ser 1.15; Hemoglobin 11.5; Platelets 283; Potassium 4.1; Sodium 140    Lipid Panel    Component Value Date/Time   CHOL 251 (H) 08/01/2018 1400   CHOL 245 (H) 02/19/2018 1030   TRIG 213 (H) 08/01/2018 1400   HDL 45 (L) 08/01/2018 1400   HDL 52 02/19/2018 1030   CHOLHDL 5.6 (H) 08/01/2018 1400   VLDL 20 09/13/2016 0817   LDLCALC 168 (H) 08/01/2018 1400   LDLDIRECT 134.0 07/19/2015 1541      Wt Readings  from Last 3 Encounters:  09/20/21 132 lb (59.9 kg)  09/14/21 132 lb 1.6 oz (59.9 kg)  09/09/21 131 lb (59.4 kg)      ASSESSMENT AND PLAN: Essential hypertension BP is poorly controlled.  She hasn't been taking lisinopril because it makes her blood pressure too low.  She will reduce lisinopril to 65m but start taking it daily.  Track BP and bring to follow up.  SOB (shortness of breath) on exertion No CAD.  Calcium score was 0 several years ago and she had no CAD on her recent chest CT.  She will get an echo.    Hyperlipidemia Continue atorvastatin.    Current medicines are reviewed at length with the patient today.  The patient does not have concerns regarding medicines.  The following changes have been made: None  Labs/ tests ordered today include:   Orders Placed This Encounter  Procedures   ECHOCARDIOGRAM COMPLETE      Disposition:   FU with Tilly Pernice C. ROval Linsey MD, [facility].   I,Mathew Stumpf,acting as a sEducation administratorfor TSkeet Latch MD.,have documented all relevant documentation on the behalf of TSkeet Latch MD,as directed by  TSkeet Latch MD while in the presence of TSkeet Latch MD.  I, TTrinidadROval Linsey MD have reviewed all documentation for this visit.  The documentation of the exam, diagnosis, procedures, and orders on 10/04/2021  are all accurate and complete.   Signed, Destanee Bedonie C. ROval Linsey MD, [facility] 10/04/2021 12:55 PM    [place]

## 2021-09-14 NOTE — Patient Instructions (Addendum)
Medication Instructions:  TAKE LISINOPRIL 5  MG DAILY   *If you need a refill on your cardiac medications before your next appointment, please call your pharmacy*  Lab Work: NONE  Testing/Procedures: Your physician has requested that you have an echocardiogram. Echocardiography is a painless test that uses sound waves to create images of your heart. It provides your doctor with information about the size and shape of your heart and how well your heart's chambers and valves are working. This procedure takes approximately one hour. There are no restrictions for this procedure.   Follow-Up: At [facility], you and your health needs are our priority.  As part of our continuing mission to provide you with exceptional heart care, we have created designated Provider Care Teams.  These Care Teams include your primary Cardiologist (physician) and Advanced Practice Providers (APPs -  Physician Assistants and Nurse Practitioners) who all work together to provide you with the care you need, when you need it.  We recommend signing up for the patient portal called "MyChart".  Sign up information is provided on this After Visit Summary.  MyChart is used to connect with patients for Virtual Visits (Telemedicine).  Patients are able to view lab/test results, encounter notes, upcoming appointments, etc.  Non-urgent messages can be sent to your provider as well.   To learn more about what you can do with MyChart, go to [email or web address].    Your next appointment:   2 month(s)  The format for your next appointment:   In Person  Provider:   Skeet Latch, MD  Other Instructions  MONITOR AND LOG YOUR BLOOD PRESSURE AT [address]

## 2021-09-14 NOTE — Assessment & Plan Note (Signed)
No CAD.  Calcium score was 0 several years ago and she had no CAD on her recent chest CT.  She will get an echo.

## 2021-09-14 NOTE — Assessment & Plan Note (Signed)
BP is poorly controlled.  She hasn't been taking lisinopril because it makes her blood pressure too low.  She will reduce lisinopril to 5mg  but start taking it daily.  Track BP and bring to follow up.

## 2021-09-20 ENCOUNTER — Ambulatory Visit (INDEPENDENT_AMBULATORY_CARE_PROVIDER_SITE_OTHER): Payer: Medicare Other | Admitting: Obstetrics and Gynecology

## 2021-09-20 ENCOUNTER — Encounter: Payer: Self-pay | Admitting: Obstetrics and Gynecology

## 2021-09-20 ENCOUNTER — Other Ambulatory Visit: Payer: Self-pay

## 2021-09-20 VITALS — BP 146/84 | HR 85 | Wt 132.0 lb

## 2021-09-20 DIAGNOSIS — N816 Rectocele: Secondary | ICD-10-CM | POA: Diagnosis not present

## 2021-09-20 NOTE — Progress Notes (Signed)
[facility]   Subjective:     Chief Complaint:  Chief Complaint  Patient presents with   Follow-up   History of Present Illness: Kassidi Elza is a 70 y.o. female with stage II pelvic organ prolapse who presents for a pessary check. She is using a size #3 ring with support pessary.  Feels that the pessary works when its in place but she feels she has to clean it constantly. Is considering surgery. Does not leak urine with the pessary in place.   Past Medical History: Patient  has a past medical history of Diabetes mellitus without complication ([place]), Ectopic pregnancy, Essential hypertension (01/30/2016), History of palpitations, PAC (premature atrial contraction) (07/28/2016), PVC (premature ventricular contraction), and SOB (shortness of breath) on exertion.   Past Surgical History: She  has a past surgical history that includes Tubal ligation.   Medications: She has a current medication list which includes the following prescription(s): atorvastatin, one touch ultra 2, vitamin d, clonazepam, glucose blood, onetouch ultrasoft, lisinopril, metformin, poly-iron 150 forte, polyethylene glycol 400, and turmeric.   Allergies: Patient has No Known Allergies.   Social History: Patient  reports that she has never smoked. She has never used smokeless tobacco. She reports that she does not drink alcohol and does not use drugs.      Objective:    Physical Exam: BP (!) 146/84   Pulse 85   Wt 132 lb (59.9 kg)   BMI 24.14 kg/m  Gen: No apparent distress, A&O x 3.  Detailed Urogynecologic Evaluation:  Pelvic Exam: Normal external female genitalia; Bartholin's and Skene's glands normal in appearance; urethral meatus normal in appearance, no urethral masses or discharge. The pessary was noted to be in place. It was removed and cleaned. Speculum exam revealed no lesions in the vagina. The pessary was replaced. It was comfortable to the patient and fit well.   POP-Q  -2                                             Aa   -2                                           Ba  -7.5                                              C   3                                            Gh  3                                            Pb  9  tvl   0                                            Ap  0                                            Bp  -8                                              D          Assessment/Plan:    Assessment: Ms. Bonanno is a 70 y.o. with stage II pelvic organ prolapse here for a pessary check.   Plan: - She is using a size #3 ring with support pessary. Cleaned and replaced today - Continue to remove at least weekly. Discussed using trimosan jelly daily to help with vaginal discharge. - She would like to pursue surgery. We discussed Posterior repair and she wants to proceed. We reviewed the patient's specific anatomic and functional findings, with the assistance of diagrams and handouts.  We reviewed the treatment options including expectant management, conservative management, medical management, and surgical management.  We reviewed the benefits and risks of each treatment option.   Will schedule surgery and pre- op visit for discussion in more detail.  All questions were answered.  [name], MD   Time Spent: I spent 30 minutes dedicated to the care of this patient on the date of this encounter to include pre-visit review of records, face-to-face time with the patient and post visit documentation.

## 2021-09-22 ENCOUNTER — Other Ambulatory Visit (HOSPITAL_BASED_OUTPATIENT_CLINIC_OR_DEPARTMENT_OTHER): Payer: Medicare Other

## 2021-09-26 ENCOUNTER — Telehealth: Payer: Self-pay

## 2021-09-26 NOTE — Telephone Encounter (Signed)
Left message to call the office to poss cancel the appt on 11/14 we referred her to the specialist so the appt isnt needed

## 2021-10-02 ENCOUNTER — Ambulatory Visit: Payer: Medicare Other | Admitting: Family Medicine

## 2021-10-04 ENCOUNTER — Encounter (HOSPITAL_BASED_OUTPATIENT_CLINIC_OR_DEPARTMENT_OTHER): Payer: Self-pay | Admitting: Cardiovascular Disease

## 2021-10-04 NOTE — Assessment & Plan Note (Signed)
Continue atorvastatin

## 2021-10-06 ENCOUNTER — Other Ambulatory Visit: Payer: Self-pay

## 2021-10-06 ENCOUNTER — Ambulatory Visit (INDEPENDENT_AMBULATORY_CARE_PROVIDER_SITE_OTHER): Payer: Medicare Other

## 2021-10-06 DIAGNOSIS — R0602 Shortness of breath: Secondary | ICD-10-CM

## 2021-10-06 DIAGNOSIS — I10 Essential (primary) hypertension: Secondary | ICD-10-CM | POA: Diagnosis not present

## 2021-10-06 LAB — ECHOCARDIOGRAM COMPLETE
AR max vel: 1.44 cm2
AV Area VTI: 1.53 cm2
AV Area mean vel: 1.51 cm2
AV Mean grad: 5 mmHg
AV Peak grad: 10.9 mmHg
Ao pk vel: 1.65 m/s
Area-P 1/2: 2.99 cm2
Calc EF: 74.5 %
S' Lateral: 2.44 cm
Single Plane A2C EF: 78.8 %
Single Plane A4C EF: 67 %

## 2021-10-10 ENCOUNTER — Telehealth: Payer: Self-pay | Admitting: Cardiovascular Disease

## 2021-10-10 ENCOUNTER — Other Ambulatory Visit: Payer: Self-pay | Admitting: Registered Nurse

## 2021-10-10 DIAGNOSIS — I70223 Atherosclerosis of native arteries of extremities with rest pain, bilateral legs: Secondary | ICD-10-CM

## 2021-10-10 NOTE — Telephone Encounter (Signed)
Hey there, pt. Called for echo results but it doesn't appear to have been read yet. Please addvise

## 2021-10-10 NOTE — Telephone Encounter (Signed)
Released in my chart with Dr Blenda Mounts comments attached

## 2021-10-10 NOTE — Telephone Encounter (Signed)
Returned pt. Call and informed her I would get her Message to Dr. Oval Linsey for review

## 2021-10-10 NOTE — Telephone Encounter (Signed)
Pt reaching out for results of echocardiogram... please advise

## 2021-10-18 ENCOUNTER — Encounter (HOSPITAL_BASED_OUTPATIENT_CLINIC_OR_DEPARTMENT_OTHER): Payer: Self-pay | Admitting: Obstetrics and Gynecology

## 2021-10-18 ENCOUNTER — Other Ambulatory Visit: Payer: Self-pay

## 2021-10-18 NOTE — Progress Notes (Signed)
Spoke w/ via phone for pre-op interview--- pt Lab needs dos----  istat (per anes)/  pre-op orders pending             Lab results------ current ekg in epic/ chart COVID test -----patient states asymptomatic no test needed Arrive at ------- 0700 on 10-23-2021 NPO after MN NO Solid Food.  Clear liquids from MN until--- 0600 Med rec completed Medications to take morning of surgery ----- none Diabetic medication ----- do not take metformin morning of surgery Patient instructed no nail polish to be worn day of surgery Patient instructed to bring photo id and insurance card day of surgery Patient aware to have Driver (ride ) / caregiver for 24 hours after surgery -- daughter, Suanne Marker quick Patient Special Instructions ----- n/a Pre-Op special Istructions ----- sent inbox message in epic to dr schroeder requested orders Patient verbalized understanding of instructions that were given at this phone interview. Patient denies shortness of breath, chest pain, fever, cough at this phone interview.

## 2021-10-20 ENCOUNTER — Encounter: Payer: Self-pay | Admitting: Obstetrics and Gynecology

## 2021-10-20 ENCOUNTER — Ambulatory Visit (INDEPENDENT_AMBULATORY_CARE_PROVIDER_SITE_OTHER): Payer: Medicare Other | Admitting: Obstetrics and Gynecology

## 2021-10-20 ENCOUNTER — Other Ambulatory Visit: Payer: Self-pay

## 2021-10-20 VITALS — BP 133/66 | HR 72

## 2021-10-20 DIAGNOSIS — N8110 Cystocele, unspecified: Secondary | ICD-10-CM

## 2021-10-20 DIAGNOSIS — N816 Rectocele: Secondary | ICD-10-CM

## 2021-10-20 DIAGNOSIS — Z01818 Encounter for other preprocedural examination: Secondary | ICD-10-CM

## 2021-10-20 MED ORDER — ACETAMINOPHEN 500 MG PO TABS: 500.0000 mg | ORAL_TABLET | Freq: Four times a day (QID) | ORAL | 0 refills | Status: DC | PRN

## 2021-10-20 MED ORDER — IBUPROFEN 600 MG PO TABS: 600.0000 mg | ORAL_TABLET | Freq: Four times a day (QID) | ORAL | 0 refills | Status: DC | PRN

## 2021-10-20 MED ORDER — OXYCODONE HCL 5 MG PO TABS: 5.0000 mg | ORAL_TABLET | ORAL | 0 refills | Status: DC | PRN

## 2021-10-20 NOTE — H&P (Signed)
[facility] Pre-Operative H&P  Subjective Chief Complaint: Michelle Robertson presents for a preoperative encounter.   History of Present Illness: Michelle Robertson is a 70 y.o. female who presents for preoperative visit.  She is scheduled to undergo posterior repair on 10/23/21.  Her symptoms include vaginal bulge, and she was was found to have Stage I anterior, Stage II posterior, Stage I apical prolapse.    Past Medical History:  Diagnosis Date   Anxiety    Arthritis    Carotid stenosis, asymptomatic, bilateral 12/2017   last ultrasound in epic 01-01-2018  bilateral ICA 40-59%   Essential hypertension    Full dentures    GERD (gastroesophageal reflux disease)    Heart palpitations 2017   cardiology --- dr t. Oval Linsey;  (10-18-2021  per intermittant palpitations) event monitor 03-20-2016 SR/ SB w/ occasional PAC and 12 lead ekg showed PVC   Hiatal hernia    History of chest pain    followed by cardiology-- dr t. Oval Linsey;  (10-18-2021  oer pt no chest pain since 09-09-2021 & cardiology 09-14-2021 note in epic) CCT -- 12/ 2013 no coronary artery calcification/ atheroscloersis;  02/ 2019 nulcear stresst test, normal perfusion no ischemia, nuclear ef 55%;  02/ 2019  carotid US, bilateral ICA 40-59% stenosis;  last echo 11/ 2022 ef 60-65%, G1DD, trivial MR;  CTA 10/ 2022 normal   History of COVID-19 06/2021   per pt mild symptoms that resolved   History of ectopic pregnancy 1991   s/p surgery   History of panic attacks    IDA (iron deficiency anemia)    OSA (obstructive sleep apnea)    10-18-2021  per pt had study 2 months ago, waiting on cpap due to being on backorder   PAC (premature atrial contraction) 2017   occasional per event monitor 05/ 2017   Posterior vaginal wall prolapse    Presence of pessary    SOB (shortness of breath) on exertion    per pt with stairs, house chores without sob, and long distance walking with occasional sob   Type 2 diabetes mellitus ([place])     followed by pcp   (10-18-2021  pt stated check blood sugar 1-2 times daily,  fasting sugar-- 90--121)   Wears glasses      Past Surgical History:  Procedure Laterality Date   ECTOPIC PREGNANCY SURGERY  1991   TUBAL LIGATION Bilateral    yrs ago    has No Known Allergies.   Family History  Problem Relation Age of Onset   Diabetes Mother    Stroke Mother    Diabetes Maternal Aunt    Diabetes Paternal Grandmother    Cancer Father     Social History   Tobacco Use   Smoking status: Never   Smokeless tobacco: Never  Vaping Use   Vaping Use: Never used  Substance Use Topics   Alcohol use: No   Drug use: Never     Review of Systems was negative for a full 10 system review except as noted in the History of Present Illness.  No current facility-administered medications for this encounter.  Current Outpatient Medications:    atorvastatin (LIPITOR) 20 MG tablet, Take 20 mg by mouth daily., Disp: , Rfl:    CHLOROPHYLL PO, Take 15 mLs by mouth daily. Vegan liquid, Disp: , Rfl:    Cholecalciferol (VITAMIN D) 50 MCG (2000 UT) CAPS, Take 1 capsule by mouth daily., Disp: , Rfl:    clonazePAM (KLONOPIN) 0.5 MG tablet, Take  1 tablet (0.5 mg total) by mouth 2 (two) times daily as needed. for anxiety (Patient taking differently: Take 0.5 mg by mouth 2 (two) times daily as needed. for anxiety), Disp: 60 tablet, Rfl: 0   lisinopril (ZESTRIL) 5 MG tablet, Take 1 tablet (5 mg total) by mouth daily. (Patient taking differently: Take 5 mg by mouth daily.), Disp: 90 tablet, Rfl: 3   metFORMIN (GLUCOPHAGE) 500 MG tablet, TAKE 1 TABLET BY MOUTH TWICE DAILY WITH A MEAL for diabetes (Patient taking differently: Take 500 mg by mouth 2 (two) times daily with a meal. TAKE 1 TABLET BY MOUTH TWICE DAILY WITH A MEAL for diabetes), Disp: 180 tablet, Rfl: 1   POLY-IRON 150 FORTE 150-25-1 MG-MCG-MG CAPS, Take 1 capsule by mouth daily., Disp: , Rfl:    Polyethyl Glycol-Propyl Glycol (SYSTANE FREE OP), Apply  to eye as needed., Disp: , Rfl:    acetaminophen (TYLENOL) 500 MG tablet, Take 1 tablet (500 mg total) by mouth every 6 (six) hours as needed (pain)., Disp: 30 tablet, Rfl: 0   Blood Glucose Monitoring Suppl (ONE TOUCH ULTRA 2) w/Device KIT, Use to test blood sugar three times daily. Dx: E11.9, Disp: 100 each, Rfl: 11   glucose blood (ONE TOUCH ULTRA TEST) test strip, Use to test blood sugar three times daily. Dx:E11.9, Disp: 100 each, Rfl: 11   ibuprofen (ADVIL) 600 MG tablet, Take 1 tablet (600 mg total) by mouth every 6 (six) hours as needed., Disp: 30 tablet, Rfl: 0   Lancets (ONETOUCH ULTRASOFT) lancets, Use to check blood sugar three times daily. Dx:E11.9, Disp: 100 each, Rfl: 11   oxyCODONE (OXY IR/ROXICODONE) 5 MG immediate release tablet, Take 1 tablet (5 mg total) by mouth every 4 (four) hours as needed for severe pain., Disp: 5 tablet, Rfl: 0   Objective  There were no vitals filed for this visit.   Gen: NAD CV: S1 S2 RRR Lungs: normal respirations Abd: soft, nontender  Previous Pelvic Exam showed: POP-Q   -2                                            Aa   -2                                           Ba   -7.5                                              C    3                                            Gh   3                                            Pb   9  tvl    0                                            Ap   0                                            Bp   -8                                              D           Assessment/ Plan  The patient is a 70 y.o. year old with stage II pelvic organ prolapse scheduled to undergo Exam under anesthesia, posterior repair.   [name], MD

## 2021-10-20 NOTE — Progress Notes (Signed)
[facility]ne[facility] Pre-Operative evaluation  Subjective Chief Complaint: Michelle Robertson presents for a preoperative encounter.   History of Present Illness: Michelle Robertson is a 70 y.o. female who presents for preoperative visit.  She is scheduled to undergo posterior repair on 10/23/21.  Her symptoms include vaginal bulge, and she was was found to have Stage I anterior, Stage II posterior, Stage I apical prolapse.    Past Medical History:  Diagnosis Date   Anxiety    Arthritis    Carotid stenosis, asymptomatic, bilateral 12/2017   last ultrasound in epic 01-01-2018  bilateral ICA 40-59%   Essential hypertension    Full dentures    GERD (gastroesophageal reflux disease)    Heart palpitations 2017   cardiology --- dr t. Oval Linsey;  (10-18-2021  per intermittant palpitations) event monitor 03-20-2016 SR/ SB w/ occasional PAC and 12 lead ekg showed PVC   Hiatal hernia    History of chest pain    followed by cardiology-- dr t. Oval Linsey;  (10-18-2021  oer pt no chest pain since 09-09-2021 & cardiology 09-14-2021 note in epic) CCT -- 12/ 2013 no coronary artery calcification/ atheroscloersis;  02/ 2019 nulcear stresst test, normal perfusion no ischemia, nuclear ef 55%;  02/ 2019  carotid US, bilateral ICA 40-59% stenosis;  last echo 11/ 2022 ef 60-65%, G1DD, trivial MR;  CTA 10/ 2022 normal   History of COVID-19 06/2021   per pt mild symptoms that resolved   History of ectopic pregnancy 1991   s/p surgery   History of panic attacks    IDA (iron deficiency anemia)    OSA (obstructive sleep apnea)    10-18-2021  per pt had study 2 months ago, waiting on cpap due to being on backorder   PAC (premature atrial contraction) 2017   occasional per event monitor 05/ 2017   Posterior vaginal wall prolapse    Presence of pessary    SOB (shortness of breath) on exertion    per pt with stairs, house chores without sob, and long distance walking with occasional sob   Type 2 diabetes mellitus  ([place])    followed by pcp   (10-18-2021  pt stated check blood sugar 1-2 times daily,  fasting sugar-- 90--121)   Wears glasses      Past Surgical History:  Procedure Laterality Date   ECTOPIC PREGNANCY SURGERY  1991   TUBAL LIGATION Bilateral    yrs ago    has No Known Allergies.   Family History  Problem Relation Age of Onset   Diabetes Mother    Stroke Mother    Diabetes Maternal Aunt    Diabetes Paternal Grandmother    Cancer Father     Social History   Tobacco Use   Smoking status: Never   Smokeless tobacco: Never  Vaping Use   Vaping Use: Never used  Substance Use Topics   Alcohol use: No   Drug use: Never     Review of Systems was negative for a full 10 system review except as noted in the History of Present Illness.   Current Outpatient Medications:    atorvastatin (LIPITOR) 20 MG tablet, Take 20 mg by mouth daily., Disp: , Rfl:    Blood Glucose Monitoring Suppl (ONE TOUCH ULTRA 2) w/Device KIT, Use to test blood sugar three times daily. Dx: E11.9, Disp: 100 each, Rfl: 11   CHLOROPHYLL PO, Take 15 mLs by mouth daily. Vegan liquid, Disp: , Rfl:    Cholecalciferol (VITAMIN D) 50 MCG (2000  UT) CAPS, Take 1 capsule by mouth daily., Disp: , Rfl:    clonazePAM (KLONOPIN) 0.5 MG tablet, Take 1 tablet (0.5 mg total) by mouth 2 (two) times daily as needed. for anxiety (Patient taking differently: Take 0.5 mg by mouth 2 (two) times daily as needed. for anxiety), Disp: 60 tablet, Rfl: 0   glucose blood (ONE TOUCH ULTRA TEST) test strip, Use to test blood sugar three times daily. Dx:E11.9, Disp: 100 each, Rfl: 11   Lancets (ONETOUCH ULTRASOFT) lancets, Use to check blood sugar three times daily. Dx:E11.9, Disp: 100 each, Rfl: 11   lisinopril (ZESTRIL) 5 MG tablet, Take 1 tablet (5 mg total) by mouth daily. (Patient taking differently: Take 5 mg by mouth daily.), Disp: 90 tablet, Rfl: 3   metFORMIN (GLUCOPHAGE) 500 MG tablet, TAKE 1 TABLET BY MOUTH TWICE DAILY WITH A MEAL  for diabetes (Patient taking differently: Take 500 mg by mouth 2 (two) times daily with a meal. TAKE 1 TABLET BY MOUTH TWICE DAILY WITH A MEAL for diabetes), Disp: 180 tablet, Rfl: 1   POLY-IRON 150 FORTE 150-25-1 MG-MCG-MG CAPS, Take 1 capsule by mouth daily., Disp: , Rfl:    Polyethyl Glycol-Propyl Glycol (SYSTANE FREE OP), Apply to eye as needed., Disp: , Rfl:    Objective  Vitals:   10/20/21 1525  BP: 133/66  Pulse: 72    Gen: NAD CV: S1 S2 RRR Lungs: normal respirations Abd: soft, nontender  Previous Pelvic Exam showed: POP-Q   -2                                            Aa   -2                                           Ba   -7.5                                              C    3                                            Gh   3                                            Pb   9                                            tvl    0                                            Ap   0  Bp   -8                                              D           Assessment/ Plan  Assessment: The patient is a 70 y.o. year old scheduled to undergo Exam under anesthesia, posterior repair. Verbal consent was obtained for these procedures.  Plan: General Surgical Consent: The patient has previously been counseled on alternative treatments, and the decision by the patient and provider was to proceed with the procedure listed above.  For all procedures, there are risks of bleeding, infection, damage to surrounding organs including but not limited to bowel, bladder, blood vessels, ureters and nerves, and need for further surgery if an injury were to occur. These risks are all low with minimally invasive surgery.   There are risks of numbness and weakness at any body site or buttock/rectal pain.  It is possible that baseline pain can be worsened by surgery, either with or without mesh. If surgery is vaginal, there is also a low risk of  possible conversion to laparoscopy or open abdominal incision where indicated. Very rare risks include blood transfusion, blood clot, heart attack, pneumonia, or death.   There is also a risk of short-term postoperative urinary retention with need to use a catheter. About half of patients need to go home from surgery with a catheter, which is then later removed in the office. The risk of long-term need for a catheter is very low. There is also a risk of worsening of overactive bladder.    Prolapse (with or without mesh): Risk factors for surgical failure  include things that put pressure on your pelvis and the surgical repair, including obesity, chronic cough, and heavy lifting or straining (including lifting children or adults, straining on the toilet, or lifting heavy objects such as furniture or anything weighing >25 lbs. Risks of recurrence is 20-30% with vaginal native tissue repair and a less than 10% with sacrocolpopexy with mesh.     We discussed consent for blood products. Risks for blood transfusion include allergic reactions, other reactions that can affect different body organs and managed accordingly, transmission of infectious diseases such as HIV or Hepatitis. However, the blood is screened. Patient consents for blood products.  Pre-operative instructions:  She was instructed to not take Aspirin/NSAIDs x 7days prior to surgery. Antibiotic prophylaxis was ordered as indicated.  Cathter use: Patient will go home with foley if needed after post-operative voiding trial.  Post-operative instructions:  She was provided with specific post-operative instructions, including precautions and signs/symptoms for which we would recommend contacting us, in addition to daytime and after-hours contact phone numbers. This was provided on a handout.   Post-operative medications: Prescriptions for motrin, tylenol,  and oxycodone were sent to her pharmacy. Discussed using ibuprofen and tylenol on a  schedule to limit use of narcotics.   Laboratory testing: no labs needed   Preoperative clearance:  She does not require surgical clearance.    Post-operative follow-up:  A post-operative appointment will be made for 6 weeks from the date of surgery. If she needs a post-operative nurse visit for a voiding trial, that will be set up after she leaves the hospital.    Patient will call the clinic or use MyChart should anything change or any new issues arise.   [name],  MD   

## 2021-10-20 NOTE — Telephone Encounter (Signed)
Patient never viewed in mychart. Printed, highlighted Dr Blenda Mounts comments and mailed to patient

## 2021-10-20 NOTE — Progress Notes (Signed)
Patient notified of surgery time change and verbalized understanding that she is is arrive at 0530, clear liquids until 0430.

## 2021-10-22 NOTE — Anesthesia Preprocedure Evaluation (Addendum)
Anesthesia Evaluation  Patient identified by MRN, date of birth, ID band Patient awake    Reviewed: Allergy & Precautions, H&P , NPO status , Patient's Chart, lab work & pertinent test results  Airway Mallampati: I  TM Distance: >3 FB Neck ROM: Full    Dental no notable dental hx. (+) Edentulous Upper, Edentulous Lower, Upper Dentures, Lower Dentures   Pulmonary neg pulmonary ROS, sleep apnea and Continuous Positive Airway Pressure Ventilation ,    Pulmonary exam normal breath sounds clear to auscultation       Cardiovascular Exercise Tolerance: Good hypertension, Pt. on medications Normal cardiovascular exam+ dysrhythmias  Rhythm:Regular Rate:Normal   02/ 2019 nulcear stresst test, normal perfusion no ischemia, nuclear ef 55%;    last echo 11/ 2022 ef 60-65%, G1DD, trivial MR;      Neuro/Psych PSYCHIATRIC DISORDERS Anxiety 02/ 2019  carotid US, bilateral ICA 40-59% stenosis; negative neurological ROS     GI/Hepatic Neg liver ROS, GERD  Medicated and Controlled,  Endo/Other  diabetes, Type 2, Oral Hypoglycemic Agents  Renal/GU negative Renal ROS  negative genitourinary   Musculoskeletal  (+) Arthritis , Osteoarthritis,    Abdominal   Peds negative pediatric ROS (+)  Hematology  (+) Blood dyscrasia, anemia ,   Anesthesia Other Findings   Reproductive/Obstetrics negative OB ROS                           Anesthesia Physical Anesthesia Plan  ASA: 3  Anesthesia Plan: General   Post-op Pain Management: Ofirmev IV (intra-op)   Induction: Intravenous  PONV Risk Score and Plan: 3 and Ondansetron, Dexamethasone and Treatment may vary due to age or medical condition  Airway Management Planned: Oral ETT and LMA  Additional Equipment: None  Intra-op Plan:   Post-operative Plan: Extubation in OR  Informed Consent: I have reviewed the patients History and Physical, chart, labs and  discussed the procedure including the risks, benefits and alternatives for the proposed anesthesia with the patient or authorized representative who has indicated his/her understanding and acceptance.       Plan Discussed with: Anesthesiologist and CRNA  Anesthesia Plan Comments: (  )        Anesthesia Quick Evaluation

## 2021-10-23 ENCOUNTER — Encounter: Payer: Medicare Other | Admitting: Obstetrics and Gynecology

## 2021-10-23 ENCOUNTER — Other Ambulatory Visit: Payer: Self-pay

## 2021-10-23 ENCOUNTER — Ambulatory Visit (HOSPITAL_BASED_OUTPATIENT_CLINIC_OR_DEPARTMENT_OTHER): Payer: Medicare Other | Admitting: Anesthesiology

## 2021-10-23 ENCOUNTER — Encounter (HOSPITAL_BASED_OUTPATIENT_CLINIC_OR_DEPARTMENT_OTHER)
Admission: RE | Disposition: A | Discharge status: Home / Self Care | Payer: Self-pay | Source: Home / Self Care | Attending: Obstetrics and Gynecology

## 2021-10-23 ENCOUNTER — Encounter (HOSPITAL_BASED_OUTPATIENT_CLINIC_OR_DEPARTMENT_OTHER): Payer: Self-pay | Admitting: Obstetrics and Gynecology

## 2021-10-23 ENCOUNTER — Telehealth: Payer: Self-pay | Admitting: Obstetrics and Gynecology

## 2021-10-23 ENCOUNTER — Ambulatory Visit (HOSPITAL_BASED_OUTPATIENT_CLINIC_OR_DEPARTMENT_OTHER)
Admission: RE | Admit: 2021-10-23 | Discharge: 2021-10-23 | Disposition: A | Discharge status: Home / Self Care | Payer: Medicare Other | Attending: Obstetrics and Gynecology | Admitting: Obstetrics and Gynecology

## 2021-10-23 DIAGNOSIS — K219 Gastro-esophageal reflux disease without esophagitis: Secondary | ICD-10-CM | POA: Diagnosis not present

## 2021-10-23 DIAGNOSIS — N816 Rectocele: Secondary | ICD-10-CM | POA: Diagnosis present

## 2021-10-23 DIAGNOSIS — I10 Essential (primary) hypertension: Secondary | ICD-10-CM | POA: Diagnosis not present

## 2021-10-23 DIAGNOSIS — M1990 Unspecified osteoarthritis, unspecified site: Secondary | ICD-10-CM | POA: Insufficient documentation

## 2021-10-23 DIAGNOSIS — F419 Anxiety disorder, unspecified: Secondary | ICD-10-CM | POA: Diagnosis not present

## 2021-10-23 DIAGNOSIS — E119 Type 2 diabetes mellitus without complications: Secondary | ICD-10-CM | POA: Diagnosis not present

## 2021-10-23 DIAGNOSIS — N8189 Other female genital prolapse: Secondary | ICD-10-CM | POA: Diagnosis not present

## 2021-10-23 DIAGNOSIS — D649 Anemia, unspecified: Secondary | ICD-10-CM | POA: Diagnosis not present

## 2021-10-23 DIAGNOSIS — G4733 Obstructive sleep apnea (adult) (pediatric): Secondary | ICD-10-CM | POA: Insufficient documentation

## 2021-10-23 HISTORY — DX: Presence of dental prosthetic device (complete) (partial): K08.109

## 2021-10-23 HISTORY — DX: Type 2 diabetes mellitus without complications: E11.9

## 2021-10-23 HISTORY — DX: Obstructive sleep apnea (adult) (pediatric): G47.33

## 2021-10-23 HISTORY — DX: Iron deficiency anemia, unspecified: D50.9

## 2021-10-23 HISTORY — DX: Rectocele: N81.6

## 2021-10-23 HISTORY — DX: Personal history of other mental and behavioral disorders: Z86.59

## 2021-10-23 HISTORY — DX: Gastro-esophageal reflux disease without esophagitis: K21.9

## 2021-10-23 HISTORY — DX: Unspecified osteoarthritis, unspecified site: M19.90

## 2021-10-23 HISTORY — DX: Presence of spectacles and contact lenses: Z97.3

## 2021-10-23 HISTORY — DX: Diaphragmatic hernia without obstruction or gangrene: K44.9

## 2021-10-23 HISTORY — DX: Personal history of other specified conditions: Z87.898

## 2021-10-23 HISTORY — DX: Anxiety disorder, unspecified: F41.9

## 2021-10-23 HISTORY — DX: Presence of urogenital implants: Z96.0

## 2021-10-23 HISTORY — DX: Complete loss of teeth, unspecified cause, unspecified class: Z97.2

## 2021-10-23 HISTORY — PX: RECTOCELE REPAIR: SHX761

## 2021-10-23 LAB — BASIC METABOLIC PANEL
Anion gap: 5 (ref 5–15)
BUN: 13 mg/dL (ref 8–23)
CO2: 23 mmol/L (ref 22–32)
Calcium: 9.1 mg/dL (ref 8.9–10.3)
Chloride: 108 mmol/L (ref 98–111)
Creatinine, Ser: 1 mg/dL (ref 0.44–1.00)
GFR, Estimated: >60 mL/min (ref >60)
Glucose, Bld: 114 mg/dL — ABNORMAL HIGH (ref 70–99)
Potassium: 4.1 mmol/L (ref 3.5–5.1)
Sodium: 136 mmol/L (ref 135–145)

## 2021-10-23 LAB — GLUCOSE, CAPILLARY: Glucose-Capillary: 128 mg/dL — ABNORMAL HIGH (ref 70–99)

## 2021-10-23 SURGERY — COLPORRHAPHY, POSTERIOR, FOR RECTOCELE REPAIR
Anesthesia: General

## 2021-10-23 MED ORDER — PROPOFOL 10 MG/ML IV BOLUS
INTRAVENOUS | Status: AC
  Filled 2021-10-23: qty 20

## 2021-10-23 MED ORDER — MEPERIDINE HCL 25 MG/ML IJ SOLN: 6.2500 mg | INTRAMUSCULAR | Status: DC | PRN

## 2021-10-23 MED ORDER — LIDOCAINE 2% (20 MG/ML) 5 ML SYRINGE
INTRAMUSCULAR | Status: AC
  Filled 2021-10-23: qty 5

## 2021-10-23 MED ORDER — KETOROLAC TROMETHAMINE 30 MG/ML IJ SOLN
INTRAMUSCULAR | Status: DC | PRN
  Administered 2021-10-23: 30 mg via INTRAVENOUS

## 2021-10-23 MED ORDER — OXYCODONE HCL 5 MG/5ML PO SOLN: 5.0000 mg | Freq: Once | ORAL | Status: DC | PRN

## 2021-10-23 MED ORDER — ONDANSETRON HCL 4 MG/2ML IJ SOLN: 4.0000 mg | Freq: Once | INTRAMUSCULAR | Status: DC | PRN

## 2021-10-23 MED ORDER — DEXAMETHASONE SODIUM PHOSPHATE 10 MG/ML IJ SOLN
INTRAMUSCULAR | Status: AC
  Filled 2021-10-23: qty 1

## 2021-10-23 MED ORDER — ONDANSETRON HCL 4 MG/2ML IJ SOLN
INTRAMUSCULAR | Status: AC
  Filled 2021-10-23: qty 2

## 2021-10-23 MED ORDER — ONDANSETRON HCL 4 MG/2ML IJ SOLN
INTRAMUSCULAR | Status: DC | PRN
  Administered 2021-10-23: 4 mg via INTRAVENOUS

## 2021-10-23 MED ORDER — CEFAZOLIN SODIUM-DEXTROSE 2-4 GM/100ML-% IV SOLN
2.0000 g | INTRAVENOUS | Status: AC
  Administered 2021-10-23: 2 g via INTRAVENOUS

## 2021-10-23 MED ORDER — SODIUM CHLORIDE 0.9 % IR SOLN
Status: DC | PRN
  Administered 2021-10-23: 500 mL

## 2021-10-23 MED ORDER — FENTANYL CITRATE (PF) 100 MCG/2ML IJ SOLN: 25.0000 ug | INTRAMUSCULAR | Status: DC | PRN

## 2021-10-23 MED ORDER — CEFAZOLIN SODIUM-DEXTROSE 2-4 GM/100ML-% IV SOLN
INTRAVENOUS | Status: AC
  Filled 2021-10-23: qty 100

## 2021-10-23 MED ORDER — KETOROLAC TROMETHAMINE 30 MG/ML IJ SOLN
INTRAMUSCULAR | Status: AC
  Filled 2021-10-23: qty 1

## 2021-10-23 MED ORDER — LIDOCAINE-EPINEPHRINE 1 %-1:100000 IJ SOLN
INTRAMUSCULAR | Status: DC | PRN
  Administered 2021-10-23: 9 mL

## 2021-10-23 MED ORDER — LACTATED RINGERS IV SOLN: INTRAVENOUS | Status: DC

## 2021-10-23 MED ORDER — FENTANYL CITRATE (PF) 100 MCG/2ML IJ SOLN
INTRAMUSCULAR | Status: AC
  Filled 2021-10-23: qty 2

## 2021-10-23 MED ORDER — EPHEDRINE 5 MG/ML INJ
INTRAVENOUS | Status: AC
  Filled 2021-10-23: qty 5

## 2021-10-23 MED ORDER — ACETAMINOPHEN 160 MG/5ML PO SOLN: 325.0000 mg | ORAL | Status: DC | PRN

## 2021-10-23 MED ORDER — MIDAZOLAM HCL 2 MG/2ML IJ SOLN
INTRAMUSCULAR | Status: AC
  Filled 2021-10-23: qty 2

## 2021-10-23 MED ORDER — DEXAMETHASONE SODIUM PHOSPHATE 10 MG/ML IJ SOLN
INTRAMUSCULAR | Status: DC | PRN
  Administered 2021-10-23: 10 mg via INTRAVENOUS

## 2021-10-23 MED ORDER — LIDOCAINE HCL (CARDIAC) PF 100 MG/5ML IV SOSY
PREFILLED_SYRINGE | INTRAVENOUS | Status: DC | PRN
  Administered 2021-10-23: 100 mg via INTRAVENOUS

## 2021-10-23 MED ORDER — OXYCODONE HCL 5 MG PO TABS: 5.0000 mg | ORAL_TABLET | Freq: Once | ORAL | Status: DC | PRN

## 2021-10-23 MED ORDER — ACETAMINOPHEN 325 MG PO TABS: 325.0000 mg | ORAL_TABLET | ORAL | Status: DC | PRN

## 2021-10-23 MED ORDER — MIDAZOLAM HCL 5 MG/5ML IJ SOLN
INTRAMUSCULAR | Status: DC | PRN
  Administered 2021-10-23: 2 mg via INTRAVENOUS

## 2021-10-23 MED ORDER — PROPOFOL 10 MG/ML IV BOLUS
INTRAVENOUS | Status: DC | PRN
  Administered 2021-10-23: 30 mg via INTRAVENOUS
  Administered 2021-10-23: 150 mg via INTRAVENOUS

## 2021-10-23 MED ORDER — FENTANYL CITRATE (PF) 100 MCG/2ML IJ SOLN
INTRAMUSCULAR | Status: DC | PRN
  Administered 2021-10-23 (×2): 25 ug via INTRAVENOUS
  Administered 2021-10-23: 50 ug via INTRAVENOUS

## 2021-10-23 MED ORDER — EPHEDRINE SULFATE 50 MG/ML IJ SOLN
INTRAMUSCULAR | Status: DC | PRN
  Administered 2021-10-23: 10 mg via INTRAVENOUS

## 2021-10-23 SURGICAL SUPPLY — 30 items
AGENT HMST KT MTR STRL THRMB (HEMOSTASIS)
BLADE CLIPPER SENSICLIP SURGIC (BLADE) ×2 IMPLANT
BLADE SURG 15 STRL LF DISP TIS (BLADE) ×1 IMPLANT
BLADE SURG 15 STRL SS (BLADE) ×2
DECANTER SPIKE VIAL GLASS SM (MISCELLANEOUS) IMPLANT
DEVICE CAPIO SLIM SINGLE (INSTRUMENTS) IMPLANT
GAUZE 4X4 16PLY ~~LOC~~+RFID DBL (SPONGE) ×2 IMPLANT
GLOVE SURG ENC MOIS LTX SZ6 (GLOVE) ×2 IMPLANT
GLOVE SURG UNDER POLY LF SZ6.5 (GLOVE) ×2 IMPLANT
GOWN STRL REUS W/TWL LRG LVL3 (GOWN DISPOSABLE) ×2 IMPLANT
HIBICLENS CHG 4% 4OZ BTL (MISCELLANEOUS) ×2 IMPLANT
HOLDER FOLEY CATH W/STRAP (MISCELLANEOUS) ×2 IMPLANT
KIT TURNOVER CYSTO (KITS) ×2 IMPLANT
NEEDLE HYPO 22GX1.5 SAFETY (NEEDLE) ×2 IMPLANT
NEEDLE MAYO 6 CRC TAPER PT (NEEDLE) IMPLANT
NS IRRIG 1000ML POUR BTL (IV SOLUTION) ×2 IMPLANT
PACK VAGINAL WOMENS (CUSTOM PROCEDURE TRAY) ×2 IMPLANT
RETRACTOR LONE STAR DISPOSABLE (INSTRUMENTS) ×2 IMPLANT
RETRACTOR STAY HOOK 5MM (MISCELLANEOUS) ×2 IMPLANT
SET IRRIG Y TYPE TUR BLADDER L (SET/KITS/TRAYS/PACK) IMPLANT
SURGIFLO W/THROMBIN 8M KIT (HEMOSTASIS) IMPLANT
SUT ABS MONO DBL WITH NDL 48IN (SUTURE) IMPLANT
SUT VIC AB 0 CT1 27 (SUTURE)
SUT VIC AB 0 CT1 27XBRD ANTBC (SUTURE) IMPLANT
SUT VIC AB 2-0 SH 27 (SUTURE)
SUT VIC AB 2-0 SH 27XBRD (SUTURE) IMPLANT
SUT VICRYL 2-0 SH 8X27 (SUTURE) ×2 IMPLANT
SYR BULB EAR ULCER 3OZ GRN STR (SYRINGE) ×2 IMPLANT
TOWEL OR 17X26 10 PK STRL BLUE (TOWEL DISPOSABLE) ×2 IMPLANT
TRAY FOLEY W/BAG SLVR 14FR LF (SET/KITS/TRAYS/PACK) ×2 IMPLANT

## 2021-10-23 NOTE — Transfer of Care (Signed)
Immediate Anesthesia Transfer of Care Note  Patient: Michelle Robertson  Procedure(s) Performed: Procedure(s) (LRB): POSTERIOR REPAIR (RECTOCELE) (N/A)  Patient Location: PACU  Anesthesia Type: General  Level of Consciousness: awake, sedated, patient cooperative and responds to stimulation  Airway & Oxygen Therapy: Patient Spontanous Breathing and Patient connected to [place] 02 and soft FM  Post-op Assessment: Report given to PACU RN, Post -op Vital signs reviewed and stable and Patient moving all extremities  Post vital signs: Reviewed and stable  Complications: No apparent anesthesia complications

## 2021-10-23 NOTE — Anesthesia Postprocedure Evaluation (Signed)
Anesthesia Post Note  Patient: Veterinary surgeon  Procedure(s) Performed: POSTERIOR REPAIR (RECTOCELE)     Patient location during evaluation: PACU Anesthesia Type: General Level of consciousness: awake and alert Pain management: pain level controlled Vital Signs Assessment: post-procedure vital signs reviewed and stable Respiratory status: spontaneous breathing, nonlabored ventilation, respiratory function stable and patient connected to nasal cannula oxygen Cardiovascular status: blood pressure returned to baseline and stable Postop Assessment: no apparent nausea or vomiting Anesthetic complications: no   No notable events documented.  Last Vitals:  Vitals:   10/23/21 0900 10/23/21 0945  BP: (!) 149/77 138/78  Pulse: 78 65  Resp: 14 16  Temp:  36.5 C  SpO2: 99% 99%    Last Pain:  Vitals:   10/23/21 0945  TempSrc:   PainSc: 0-No pain                 Abshir Paolini

## 2021-10-23 NOTE — Interval H&P Note (Signed)
History and Physical Interval Note:  10/23/2021 7:16 AM  Michelle Robertson  has presented today for surgery, with the diagnosis of posterior vaginal prolapse.  The various methods of treatment have been discussed with the patient and family. After consideration of risks, benefits and other options for treatment, the patient has consented to  Procedure(s): POSTERIOR REPAIR (RECTOCELE) (N/A) as a surgical intervention.  The patient's history has been reviewed, patient examined, no change in status, stable for surgery.  I have reviewed the patient's chart and labs.  Questions were answered to the patient's satisfaction.     [name]

## 2021-10-23 NOTE — Op Note (Signed)
Operative Note  Preoperative Diagnosis: posterior vaginal prolapse  Postoperative Diagnosis: same  Procedures performed:  Posterior repair  Implants: * No implants in log *  Attending Surgeon: Sherlene Shams, MD   Anesthesia: General LMA  Findings: Stage II posterior vaginal prolapse on pelvic exam.   Specimens: * No specimens in log *  Estimated blood loss: 50 mL  IV fluids: see anesthesia note  Urine output: 093 mL  Complications: none  Procedure in Detail:  After informed consent was obtained, the patient was taken to the operating room where general anesthesia was induced. She was placed in dorsal lithotomy position, taking care to avoid any traction of the extremities and prepped and draped in the usual sterile fashion. A self-retaining lonestar retractor was placed using four elastic blue stays.  A foley catheter was placed to drain the bladder and removed.  Attention was then turned to the posterior vagina.  Two Allis clamps were placed in the midline of the posterior vaginal wall defect.  1% lidocaine with epinephrine was injected into the vaginal mucosa. A vertical incision was made between these clamps with a 15 blade scalpel.  The rectovaginal septum was then dissected off the vaginal mucosa bilaterally.  No enterocele was noted.  The rectovaginal septum was then reapproximated with plicating sutures of 2-0 Vicryl.  After placement of the first plication stitch two fingers were inserted into the vaginal to confirm adequate caliber.  The last distal stitch incorporated the perineal body in a U stitch fashion.  After plication, the excess vaginal mucosa was trimmed and the vaginal mucosa was reapproximated using 2-0 Vicryl sutures in a running fashion.  Hemostasis was noted.  Vaginal packing was not placed.  A rectal examination was normal and confirmed no sutures within the rectum.  The patient tolerated the procedure well.  She was awakened from anesthesia and transferred  to the recovery room in stable condition. All counts were correct x 2.    [name], MD

## 2021-10-23 NOTE — Telephone Encounter (Signed)
Michelle Robertson underwent posterior repair on 10/23/21.   She did not have a voiding trial.   She was discharged without a catheter. Please call her for a routine post op check.  [name], MD

## 2021-10-23 NOTE — Discharge Instructions (Addendum)
POST OPERATIVE INSTRUCTIONS  General Instructions Recovery (not bed rest) will last approximately 6 weeks Walking is encouraged, but refrain from strenuous exercise/ housework/ heavy lifting. No lifting >10lbs  Nothing in the vagina- NO intercourse, tampons or douching Bathing:  Do not submerge in water (NO swimming, bath, hot tub, etc) until after your postop visit. You can shower starting the day after surgery.  No driving until you are not taking narcotic pain medicine and until your pain is well enough controlled that you can slam on the breaks or make sudden movements if needed.   Taking your medications Please take your acetaminophen and ibuprofen on a schedule for the first 48 hours. Take 600mg ibuprofen, then take 500mg acetaminophen 3 hours later, then continue to alternate ibuprofen and acetaminophen. That way you are taking each type of medication every 6 hours. Take the prescribed narcotic (oxycodone, tramadol, etc) as needed, with a maximum being every 4 hours.  Take a stool softener daily to keep your stools soft and preventing you from straining. If you have diarrhea, you decrease your stool softener. This is explained more below. We have prescribed you Miralax.  Reasons to Call the Nurse (see last page for phone numbers) Heavy Bleeding (changing your pad every 1-2 hours) Persistent nausea/vomiting Fever (100.4 degrees or more) Incision problems (pus or other fluid coming out, redness, warmth, increased pain)  Things to Expect After Surgery Mild to Moderate pain is normal during the first day or two after surgery. If prescribed, take Ibuprofen or Tylenol first and use the stronger medicine for "break-through" pain. You can overlap these medicines because they work differently.   Constipation   To Prevent Constipation:  Eat a well-balanced diet including protein, grains, fresh fruit and vegetables.  Drink plenty of fluids. Walk regularly.  Depending on specific instructions  from your physician: take Miralax daily and additionally you can add a stool softener (colace/ docusate) and fiber supplement. Continue as long as you're on pain medications.   To Treat Constipation:  If you do not have a bowel movement in 2 days after surgery, you can take 2 Tbs of Milk of Magnesia 1-2 times a day until you have a bowel movement. If diarrhea occurs, decrease the amount or stop the laxative. If no results with Milk of Magnesia, you can drink a bottle of magnesium citrate which you can purchase over the counter.  Fatigue:  This is a normal response to surgery and will improve with time.  Plan frequent rest periods throughout the day.  Gas Pain:  This is very common but can also be very painful! Drink warm liquids such as herbal teas, bouillon or soup. Walking will help you pass more gas.  Mylicon or Gas-X can be taken over the counter.  Leaking Urine:  Varying amounts of leakage may occur after surgery.  This should improve with time. Your bladder needs at least 3 months to recover from surgery. If you leak after surgery, be sure to mention this to your doctor at your post-op visit. If you were taking medications for overactive bladder prior to surgery, be sure to restart the medications immediately after surgery.  Incisions: If you have incisions on your abdomen, the skin glue will dissolve on its own over time. It is ok to gently rinse with soap and water over these incisions but do not scrub.  Catheter Approximately 50% of patients are unable to urinate after surgery and need to go home with a catheter. This allows your bladder to   rest so it can return to full function. If you go home with a catheter, the office will call to set up a voiding trial a few days after surgery. For most patients, by this visit, they are able to urinate on their own. Long term catheter use is rare.   Return to Work  As work demands and recovery times vary widely, it is hard to predict when you will want  to return to work. If you have a desk job with no strenuous physical activity, and if you would like to return sooner than generally recommended, discuss this with your provider or call our office.   Post op concerns  For non-emergent issues, please call the Urogynecology Nurse. Please leave a message and someone will contact you within one business day.  You can also send a message through MyChart.   AFTER HOURS (After 5:00 PM and on weekends):  For urgent matters that cannot wait until the next business day. Call our office [phone] and connect to the doctor on call.  Please reserve this for important issues.   **FOR ANY TRUE EMERGENCY ISSUES CALL 911 OR GO TO THE NEAREST EMERGENCY ROOM.** Please inform our office or the doctor on call of any emergency.     APPOINTMENTS: Call [phone]  Post Anesthesia Home Care Instructions  Activity: Get plenty of rest for the remainder of the day. A responsible adult should stay with you for 24 hours following the procedure.  For the next 24 hours, DO NOT: -Drive a car -Operate machinery -Drink alcoholic beverages -Take any medication unless instructed by your physician -Make any legal decisions or sign important papers.  Meals: Start with liquid foods such as gelatin or soup. Progress to regular foods as tolerated. Avoid greasy, spicy, heavy foods. If nausea and/or vomiting occur, drink only clear liquids until the nausea and/or vomiting subsides. Call your physician if vomiting continues.  Special Instructions/Symptoms: Your throat may feel dry or sore from the anesthesia or the breathing tube placed in your throat during surgery. If this causes discomfort, gargle with warm salt water. The discomfort should disappear within 24 hours.  If you had a scopolamine patch placed behind your ear for the management of post- operative nausea and/or vomiting:  1. The medication in the patch is effective for 72 hours, after which it should be  removed.  Wrap patch in a tissue and discard in the trash. Wash hands thoroughly with soap and water. 2. You may remove the patch earlier than 72 hours if you experience unpleasant side effects which may include dry mouth, dizziness or visual disturbances. 3. Avoid touching the patch. Wash your hands with soap and water after contact with the patch.   

## 2021-10-23 NOTE — Anesthesia Procedure Notes (Signed)
Procedure Name: LMA Insertion Date/Time: 10/23/2021 7:43 AM Performed by: Justice Rocher, CRNA Pre-anesthesia Checklist: Patient identified, Emergency Drugs available, Suction available, Patient being monitored and Timeout performed Patient Re-evaluated:Patient Re-evaluated prior to induction Oxygen Delivery Method: Circle system utilized Preoxygenation: Pre-oxygenation with 100% oxygen Induction Type: IV induction Ventilation: Mask ventilation without difficulty LMA: LMA inserted LMA Size: 4.0 Number of attempts: 1 Airway Equipment and Method: Bite block Placement Confirmation: positive ETCO2, CO2 detector and breath sounds checked- equal and bilateral Tube secured with: Tape Dental Injury: Teeth and Oropharynx as per pre-operative assessment

## 2021-10-24 ENCOUNTER — Encounter: Payer: Self-pay | Admitting: *Deleted

## 2021-10-24 ENCOUNTER — Encounter (HOSPITAL_BASED_OUTPATIENT_CLINIC_OR_DEPARTMENT_OTHER): Payer: Self-pay | Admitting: Obstetrics and Gynecology

## 2021-10-25 NOTE — Telephone Encounter (Signed)
Post- Op Call  Michelle Robertson underwent posterior repair on 10/23/2021 with Dr Wannetta Sender. The patient reports that her pain is controlled. She is taking ibuprofen. She denies vaginal bleeding. She has not had a bowel movement and is taking Metamucil for a bowel regimen. She was discharged without a catheter.  [name], CMA

## 2021-10-26 ENCOUNTER — Ambulatory Visit: Payer: Medicare Other | Admitting: Obstetrics and Gynecology

## 2021-10-30 ENCOUNTER — Telehealth: Payer: Self-pay | Admitting: Obstetrics and Gynecology

## 2021-10-30 ENCOUNTER — Other Ambulatory Visit: Payer: Medicare Other

## 2021-10-30 NOTE — Telephone Encounter (Signed)
Patient calling stating that she is feeling a bulge again in the vaginal area and is concerned. She would like to come in for a visit. Will schedule her for earlier post op visit for an exam.

## 2021-10-31 NOTE — Progress Notes (Signed)
[facility]  Date of Visit: 11/01/2021  History of Present Illness: Michelle Robertson is a 70 y.o. female scheduled today for a post-operative visit.   Surgery: s/p posterior repair on 10/23/21  She did not have a post-operative voiding trial.    Today she reports that her prolapse was feeling good for about a week, then all of a sudden she started feeling a bulge in the vagina again. Present especially with walking. Has not been doing any heavy lifting or anything strenuous.    Medications: She has a current medication list which includes the following prescription(s): acetaminophen, atorvastatin, one touch ultra 2, chlorophyll, vitamin d, clonazepam, glucose blood, ibuprofen, onetouch ultrasoft, lisinopril, metformin, oxycodone, poly-iron 150 forte, and polyethyl glycol-propyl glycol.   Allergies: Patient has No Known Allergies.   Physical Exam: BP 125/69    Pulse 77    Wt 129 lb (58.5 kg)    BMI 23.22 kg/m    Pelvic Examination: Vagina: Incisions healing well. Sutures are present at incision line and there is not granulation tissue. No tenderness along the anterior or posterior vagina. No apical tenderness. No pelvic masses.   POP-Q: POP-Q  -0.5                                            Aa   -0.5                                           Ba  -4.5                                              C   3.5                                            Gh  3                                            Pb  8                                            tvl   -3                                            Ap  -3                                            Bp  -6.5  D    ---------------------------------------------------------  Assessment and Plan:  1. Prolapse of anterior vaginal wall   2. Uterovaginal prolapse, incomplete    - Posterior vaginal prolapse repair is holding well but now she has anterior and apical prolapse  which was not noted on previous exams.  - We discussed potential options for further surgical repair. We discussed that we should wait until she is fully healed from this surgery to pursue a second surgical repair.   All questions answered.   [name], MD

## 2021-11-01 ENCOUNTER — Other Ambulatory Visit: Payer: Self-pay

## 2021-11-01 ENCOUNTER — Ambulatory Visit (INDEPENDENT_AMBULATORY_CARE_PROVIDER_SITE_OTHER): Payer: Medicare Other | Admitting: Obstetrics and Gynecology

## 2021-11-01 ENCOUNTER — Encounter: Payer: Self-pay | Admitting: Obstetrics and Gynecology

## 2021-11-01 VITALS — BP 125/69 | HR 77 | Wt 129.0 lb

## 2021-11-01 DIAGNOSIS — N8110 Cystocele, unspecified: Secondary | ICD-10-CM

## 2021-11-01 DIAGNOSIS — N812 Incomplete uterovaginal prolapse: Secondary | ICD-10-CM

## 2021-11-03 ENCOUNTER — Ambulatory Visit
Admission: RE | Admit: 2021-11-03 | Discharge: 2021-11-03 | Disposition: A | Discharge status: Home / Self Care | Payer: Medicare Other | Source: Ambulatory Visit | Attending: Registered Nurse | Admitting: Registered Nurse

## 2021-11-03 DIAGNOSIS — I70223 Atherosclerosis of native arteries of extremities with rest pain, bilateral legs: Secondary | ICD-10-CM

## 2021-11-06 DIAGNOSIS — W19XXXA Unspecified fall, initial encounter: Secondary | ICD-10-CM

## 2021-11-06 HISTORY — DX: Unspecified fall, initial encounter: W19.XXXA

## 2021-11-09 ENCOUNTER — Emergency Department (HOSPITAL_COMMUNITY): Payer: Medicare Other

## 2021-11-09 ENCOUNTER — Emergency Department (HOSPITAL_COMMUNITY)
Admission: EM | Admit: 2021-11-09 | Discharge: 2021-11-09 | Disposition: A | Discharge status: Home / Self Care | Payer: Medicare Other | Attending: Emergency Medicine | Admitting: Emergency Medicine

## 2021-11-09 ENCOUNTER — Encounter (HOSPITAL_COMMUNITY): Payer: Self-pay | Admitting: *Deleted

## 2021-11-09 ENCOUNTER — Other Ambulatory Visit: Payer: Self-pay

## 2021-11-09 DIAGNOSIS — W19XXXA Unspecified fall, initial encounter: Secondary | ICD-10-CM | POA: Diagnosis not present

## 2021-11-09 DIAGNOSIS — S0003XA Contusion of scalp, initial encounter: Secondary | ICD-10-CM | POA: Insufficient documentation

## 2021-11-09 DIAGNOSIS — I10 Essential (primary) hypertension: Secondary | ICD-10-CM | POA: Insufficient documentation

## 2021-11-09 DIAGNOSIS — Z8616 Personal history of COVID-19: Secondary | ICD-10-CM | POA: Insufficient documentation

## 2021-11-09 DIAGNOSIS — Z7984 Long term (current) use of oral hypoglycemic drugs: Secondary | ICD-10-CM | POA: Diagnosis not present

## 2021-11-09 DIAGNOSIS — S060X0A Concussion without loss of consciousness, initial encounter: Secondary | ICD-10-CM | POA: Diagnosis not present

## 2021-11-09 DIAGNOSIS — Z79899 Other long term (current) drug therapy: Secondary | ICD-10-CM | POA: Diagnosis not present

## 2021-11-09 DIAGNOSIS — W01198A Fall on same level from slipping, tripping and stumbling with subsequent striking against other object, initial encounter: Secondary | ICD-10-CM | POA: Diagnosis not present

## 2021-11-09 DIAGNOSIS — E119 Type 2 diabetes mellitus without complications: Secondary | ICD-10-CM | POA: Insufficient documentation

## 2021-11-09 DIAGNOSIS — S0990XA Unspecified injury of head, initial encounter: Secondary | ICD-10-CM | POA: Diagnosis present

## 2021-11-09 NOTE — ED Provider Notes (Signed)
Emergency Medicine Provider Triage Evaluation Note  Michelle Robertson , a 70 y.o. female  was evaluated in triage.  Pt complains of headache.  Patient states that she suffered a fall Monday night.  States that she missed a step while coming downstairs and fell down 1 step.  Patient reports hitting her head on the floor.  Denies any loss of consciousness.  States that she has had headaches Tuesday, Wednesday, and Thursday.  States that pain is located throughout her entire head.  States that pain was worse this morning.  Patient reports minimal improvement with ibuprofen at home.  Patient denies any blood thinner use.  Review of Systems  Positive: Headache Negative: Neck pain, back pain, numbness, weakness, visual disturbance, facial asymmetry, dysarthria, saddle anesthesia, bowel or bladder dysfunction  Physical Exam  BP (!) 161/98 (BP Location: Left Arm)    Pulse 65    Temp 98.4 F (36.9 C) (Oral)    Resp 16    SpO2 100%  Gen:   Awake, no distress   Resp:  Normal effort  MSK:   Moves extremities without difficulty  Other:  No midline tenderness or deformity to cervical, thoracic, or lumbar spine.  Patient moves all limbs equally without difficulty.  No dysarthria or facial asymmetry.  Medical Decision Making  Medically screening exam initiated at 1:01 PM.  Appropriate orders placed.  Michelle Robertson was informed that the remainder of the evaluation will be completed by another provider, this initial triage assessment does not replace that evaluation, and the importance of remaining in the ED until their evaluation is complete.  Due to patient's advanced age will obtain noncontrast head and cervical spine CT to her fall.   Loni Beckwith, PA-C 11/09/21 1302    Lorelle Gibbs, DO 11/10/21 1529

## 2021-11-09 NOTE — Discharge Instructions (Signed)
Your CT scan today did not show any bleeding or fractures  See your doctor for follow-up  Expect to have some headaches.  Take Tylenol or Motrin for headache  Return to ER if you have worse headaches, vomiting, weakness or numbness

## 2021-11-09 NOTE — ED Provider Notes (Signed)
[facility] EMERGENCY DEPARTMENT Provider Note   CSN: [id number] Arrival date & time: 11/09/21  1241     History Chief Complaint  Patient presents with   Fall   Headache    Michelle Robertson is a 70 y.o. female hx of HTN, diabetes here presenting with fall.  Patient states that 3 days ago, she had a mechanical fall and fell and hit the back of her head.  Denies any loss of consciousness She has been having headaches since then.  Denies any other injuries.  Denies any vomiting.  Patient is not on blood thinners  The history is provided by the patient.      Past Medical History:  Diagnosis Date   Anxiety    Arthritis    Carotid stenosis, asymptomatic, bilateral 12/2017   last ultrasound in epic 01-01-2018  bilateral ICA 40-59%   Essential hypertension    Full dentures    GERD (gastroesophageal reflux disease)    Heart palpitations 2017   cardiology --- dr t. Oval Linsey;  (10-18-2021  per intermittant palpitations) event monitor 03-20-2016 SR/ SB w/ occasional PAC and 12 lead ekg showed PVC   Hiatal hernia    History of chest pain    followed by cardiology-- dr t. Oval Linsey;  (10-18-2021  oer pt no chest pain since 09-09-2021 & cardiology 09-14-2021 note in epic) CCT -- 12/ 2013 no coronary artery calcification/ atheroscloersis;  02/ 2019 nulcear stresst test, normal perfusion no ischemia, nuclear ef 55%;  02/ 2019  carotid US, bilateral ICA 40-59% stenosis;  last echo 11/ 2022 ef 60-65%, G1DD, trivial MR;  CTA 10/ 2022 normal   History of COVID-19 06/2021   per pt mild symptoms that resolved   History of ectopic pregnancy 1991   s/p surgery   History of panic attacks    IDA (iron deficiency anemia)    OSA (obstructive sleep apnea)    10-18-2021  per pt had study 2 months ago, waiting on cpap due to being on backorder   PAC (premature atrial contraction) 2017   occasional per event monitor 05/ 2017   Posterior vaginal wall prolapse    Presence of pessary     SOB (shortness of breath) on exertion    per pt with stairs, house chores without sob, and long distance walking with occasional sob   Type 2 diabetes mellitus ([place])    followed by pcp   (10-18-2021  pt stated check blood sugar 1-2 times daily,  fasting sugar-- 90--121)   Wears glasses     Patient Active Problem List   Diagnosis Date Noted   SOB (shortness of breath) on exertion    PVC (premature ventricular contraction)    History of palpitations    Ectopic pregnancy    Diabetes mellitus without complication ([place])    PAC (premature atrial contraction) 07/28/2016   Essential hypertension 01/30/2016   Nausea without vomiting 09/14/2015   GAD (generalized anxiety disorder) 09/14/2015   Left knee pain 07/20/2015   BPPV (benign paroxysmal positional vertigo) 03/18/2015   Diabetes mellitus type 2, controlled ([place]) 09/03/2013   Palpitations 09/02/2013   Hyperlipidemia 07/06/2010   BUNIONS, BILATERAL 04/06/2010    Past Surgical History:  Procedure Laterality Date   ECTOPIC PREGNANCY SURGERY  1991   RECTOCELE REPAIR N/A 10/23/2021   Procedure: POSTERIOR REPAIR (RECTOCELE);  Surgeon: [name], MD;  Location: [facility];  Service: Gynecology;  Laterality: N/A;   TUBAL LIGATION Bilateral    yrs  ago     OB History     Gravida  5   Para  3   Term  3   Preterm      AB  2   Living  3      SAB      IAB  1   Ectopic  1   Multiple      Live Births  3           Family History  Problem Relation Age of Onset   Diabetes Mother    Stroke Mother    Diabetes Maternal Aunt    Diabetes Paternal Grandmother    Cancer Father     Social History   Tobacco Use   Smoking status: Never   Smokeless tobacco: Never  Vaping Use   Vaping Use: Never used  Substance Use Topics   Alcohol use: No   Drug use: Never    Home Medications Prior to Admission medications   Medication Sig Start Date End Date Taking? Authorizing Provider   acetaminophen (TYLENOL) 500 MG tablet Take 1 tablet (500 mg total) by mouth every 6 (six) hours as needed (pain). 10/20/21   [name], MD  atorvastatin (LIPITOR) 20 MG tablet Take 20 mg by mouth daily.    [provider]  Blood Glucose Monitoring Suppl (ONE TOUCH ULTRA 2) w/Device KIT Use to test blood sugar three times daily. Dx: E11.9 08/18/18   Gildardo Cranker, DO  CHLOROPHYLL PO Take 15 mLs by mouth daily. Vegan liquid    [provider]  Cholecalciferol (VITAMIN D) 50 MCG (2000 UT) CAPS Take 1 capsule by mouth daily.    [provider]  clonazePAM (KLONOPIN) 0.5 MG tablet Take 1 tablet (0.5 mg total) by mouth 2 (two) times daily as needed. for anxiety Patient taking differently: Take 0.5 mg by mouth 2 (two) times daily as needed. for anxiety 12/06/17   Gildardo Cranker, DO  glucose blood (ONE TOUCH ULTRA TEST) test strip Use to test blood sugar three times daily. Dx:E11.9 08/18/18   Gildardo Cranker, DO  ibuprofen (ADVIL) 600 MG tablet Take 1 tablet (600 mg total) by mouth every 6 (six) hours as needed. 10/20/21   [name], MD  Lancets [facility] ULTRASOFT) lancets Use to check blood sugar three times daily. Dx:E11.9 08/18/18   Gildardo Cranker, DO  lisinopril (ZESTRIL) 5 MG tablet Take 1 tablet (5 mg total) by mouth daily. Patient taking differently: Take 5 mg by mouth daily. 09/14/21   Skeet Latch, MD  metFORMIN (GLUCOPHAGE) 500 MG tablet TAKE 1 TABLET BY MOUTH TWICE DAILY WITH A MEAL for diabetes Patient taking differently: Take 500 mg by mouth 2 (two) times daily with a meal. TAKE 1 TABLET BY MOUTH TWICE DAILY WITH A MEAL for diabetes 09/30/18   Lauree Chandler, NP  oxyCODONE (OXY IR/ROXICODONE) 5 MG immediate release tablet Take 1 tablet (5 mg total) by mouth every 4 (four) hours as needed for severe pain. Patient not taking: Reported on 10/23/2021 10/20/21   [name], MD  POLY-IRON 150 FORTE 150-25-1 MG-MCG-MG CAPS Take 1 capsule  by mouth daily. 04/05/20   [provider]  Polyethyl Glycol-Propyl Glycol (SYSTANE FREE OP) Apply to eye as needed.    [provider]    Allergies    Patient has no known allergies.  Review of Systems   Review of Systems  Neurological:  Positive for headaches.  All other systems reviewed and are negative.  Physical Exam Updated Vital Signs BP (!) 161/98 (BP Location: Left Arm)    Pulse 65    Temp 98.4 F (36.9 C) (Oral)    Resp 16    SpO2 100%   Physical Exam Vitals and nursing note reviewed.  HENT:     Head:     Comments: Questionable posterior scalp hematoma.  No scalp laceration  Eyes:     General: No visual field deficit.    Extraocular Movements: Extraocular movements intact.     Pupils: Pupils are equal, round, and reactive to light.  Cardiovascular:     Rate and Rhythm: Normal rate and regular rhythm.     Heart sounds: Normal heart sounds.  Pulmonary:     Effort: Pulmonary effort is normal.     Breath sounds: Normal breath sounds.  Abdominal:     General: Bowel sounds are normal.     Palpations: Abdomen is soft.  Musculoskeletal:        General: Normal range of motion.     Cervical back: Normal range of motion and neck supple.  Skin:    General: Skin is warm.  Neurological:     Mental Status: She is alert and oriented to person, place, and time.     Cranial Nerves: No cranial nerve deficit, dysarthria or facial asymmetry.     Sensory: No sensory deficit.  Psychiatric:        Mood and Affect: Mood normal.        Behavior: Behavior normal.    ED Results / Procedures / Treatments   Labs (all labs ordered are listed, but only abnormal results are displayed) Labs Reviewed - No data to display  EKG None  Radiology CT HEAD WO CONTRAST (5MM)  Result Date: 11/09/2021 CLINICAL DATA:  Head trauma EXAM: CT HEAD WITHOUT CONTRAST TECHNIQUE: Contiguous axial images were obtained from the base of the skull through the vertex without intravenous  contrast. COMPARISON:  01/10/2015 FINDINGS: Brain: No evidence of acute infarction, hemorrhage, hydrocephalus, extra-axial collection or mass lesion/mass effect. Vascular: No hyperdense vessel or unexpected calcification. Skull: Normal. Negative for fracture or focal lesion. Sinuses/Orbits: No acute finding. Other: None. IMPRESSION: No acute intracranial abnormality by noncontrast CT. Normal exam for age. Electronically Signed   By: Jerilynn Mages.  Shick M.D.   On: 11/09/2021 13:56   CT Cervical Spine Wo Contrast  Result Date: 11/09/2021 CLINICAL DATA:  Recent fall, headache and trauma EXAM: CT CERVICAL SPINE WITHOUT CONTRAST TECHNIQUE: Multidetector CT imaging of the cervical spine was performed without intravenous contrast. Multiplanar CT image reconstructions were also generated. COMPARISON:  None. FINDINGS: Alignment: Normal. Skull base and vertebrae: No acute fracture. No primary bone lesion or focal pathologic process. Soft tissues and spinal canal: No prevertebral fluid or swelling. No visible canal hematoma. Disc levels: Degenerative changes most pronounced at C5-6 with anterior overhanging osteophytes. Facets are aligned. No significant facet disease. Degenerative changes of the C1-2 articulation as well. Intact odontoid. Upper chest: Negative. Other: None. IMPRESSION: Minor degenerative changes most pronounced at C5-6. No acute osseous finding, fracture or malalignment by CT. Electronically Signed   By: Jerilynn Mages.  Shick M.D.   On: 11/09/2021 14:08    Procedures Procedures   Medications Ordered in ED Medications - No data to display  ED Course  I have reviewed the triage vital signs and the nursing notes.  Pertinent labs & imaging results that were available during my care of the patient were reviewed by me and considered in my medical decision making (  see chart for details).    MDM Rules/Calculators/A&P                         Devoiry Corriher is a 70 y.o. female here with fall.  Patient has scalp  hematoma.  Patient has unremarkable neuro exam.  CT head and neck unremarkable.  Likely has postconcussive syndrome.  No other signs of trauma. Recommend Tylenol and Motrin for pain     Final Clinical Impression(s) / ED Diagnoses Final diagnoses:  None    Rx / DC Orders ED Discharge Orders     None        Drenda Freeze, MD 11/09/21 1540

## 2021-11-09 NOTE — ED Triage Notes (Signed)
Pt reports having a fall on Monday night. She states that she slipped and fell, hit her head on floor. No loc. Having posterior headache since then but this am was more severe x 2 hours. Denies any blood thinners.

## 2021-12-01 ENCOUNTER — Other Ambulatory Visit: Payer: Self-pay

## 2021-12-01 ENCOUNTER — Ambulatory Visit (INDEPENDENT_AMBULATORY_CARE_PROVIDER_SITE_OTHER): Payer: Commercial Managed Care - HMO | Admitting: Cardiovascular Disease

## 2021-12-01 DIAGNOSIS — E7800 Pure hypercholesterolemia, unspecified: Secondary | ICD-10-CM

## 2021-12-01 DIAGNOSIS — I10 Essential (primary) hypertension: Secondary | ICD-10-CM | POA: Diagnosis not present

## 2021-12-01 DIAGNOSIS — I491 Atrial premature depolarization: Secondary | ICD-10-CM | POA: Diagnosis not present

## 2021-12-01 MED ORDER — ATORVASTATIN CALCIUM 20 MG PO TABS: 20.0000 mg | ORAL_TABLET | Freq: Every day | ORAL | 3 refills | Status: DC

## 2021-12-01 NOTE — Progress Notes (Signed)
Cardiology Office Note  Date:  12/05/2021   ID:  Michelle Robertson, DOB 12-02-1950, MRN [id number]  PCP:  Michelle Side., FNP  Cardiologist:   Michelle Latch, MD   No chief complaint on file.   Patient ID: Michelle Robertson is a 71 y.o. female with diabetes mellitus type 2, PACs, PVCs and hyperlipidemia who presents for follow up.  Michelle Robertson was seen in the ED on 01/2016 with palpitations.  She reported intermittent episodes of nausea and palpitations.  It was associated with shortness of breath, nausea and occasionally dizziness.   In the ED EKG revealed sinus rhythm with occasional PVCs.  Labs revealed mild anemia that was stable and no electrolyte abnormalities.  Michelle Robertson had an echo in 2014 that revealed LVEF 65-70% and was otherwise unremarkable.  She was started on metoprolol 13m daily, which helped.   She wore a 7 day event monitor that showed occasional PACs.  At her last appointment Michelle Robertson' blood pressure was poorly controlled and she had frequent palpitations.  Therefore metoprolol was increased.  She was also started on rosuvastatin.   Michelle Robertson husband passed away 8/201.  [place]  [place] she was settling his estate and was under a lot of stress.  She had some episodes of high blood pressure and palpitations.      Ms. CKalanwas seen in the ED for chest pain.  Cardiac enzymes were negative but D-dimer was mildly elevated. She had a chest CT that was negative for PE. There was calcification of the aorta but no coronary calcification.   At her last appointment she continued to have intermittent chest pain.  Given that she previously had a calcium score of 0 and had no coronary disease on her recent chest CT, no ischemic evaluation was pursued.  She did have an echo 09/2021 which revealed LVEF 60 to 65% with grade 1 diastolic dysfunction.  Lately she has felt well from a cardiac standpoint.  She struggles with chronic back pain and intermittent  weakness in her right leg. She had repair for vaginal prolapse.  That improved but five days later she had protrusion of her bladder and may need surgery for that.  Lately her blood pressure has been doing better.  She is tolerating her statin.  She has no lower extremity edema, orthopnea, or PND.    Past Medical History:  Diagnosis Date   Anxiety    Arthritis    Carotid stenosis, asymptomatic, bilateral 12/2017   last ultrasound in epic 01-01-2018  bilateral ICA 40-59%   Essential hypertension    Full dentures    GERD (gastroesophageal reflux disease)    Heart palpitations 2017   cardiology --- dr t. rOval Linsey  (10-18-2021  per intermittant palpitations) event monitor 03-20-2016 SR/ SB w/ occasional PAC and 12 lead ekg showed PVC   Hiatal hernia    History of chest pain    followed by cardiology-- dr t. rOval Linsey  (10-18-2021  oer pt no chest pain since 09-09-2021 & cardiology 09-14-2021 note in epic) CCT -- 12/ 2013 no coronary artery calcification/ atheroscloersis;  02/ 2019 nulcear stresst test, normal perfusion no ischemia, nuclear ef 55%;  02/ 2019  carotid [place] bilateral ICA 40-59% stenosis;  last echo 11/ 2022 ef 60-65%, G1DD, trivial MR;  CTA 10/ 2022 normal   History of COVID-19 009-19-22  per pt mild symptoms that resolved   History of ectopic pregnancy 1991  s/p surgery   History of panic attacks    IDA (iron deficiency anemia)    OSA (obstructive sleep apnea)    10-18-2021  per pt had study 2 months ago, waiting on cpap due to being on backorder   PAC (premature atrial contraction) 2017   occasional per event monitor 05/ 2017   Posterior vaginal wall prolapse    Presence of pessary    SOB (shortness of breath) on exertion    per pt with stairs, house chores without sob, and long distance walking with occasional sob   Type 2 diabetes mellitus (Michelle Robertson)    followed by pcp   (10-18-2021  pt stated check blood sugar 1-2 times daily,  fasting sugar-- 90--121)   Wears glasses      Past Surgical History:  Procedure Laterality Date   ECTOPIC PREGNANCY SURGERY  1991   RECTOCELE REPAIR N/A 10/23/2021   Procedure: POSTERIOR REPAIR (RECTOCELE);  Surgeon: Michelle Folds, MD;  [facility];  Service: Gynecology;  Laterality: N/A;   TUBAL LIGATION Bilateral    yrs ago    Current Outpatient Medications  Medication Sig Dispense Refill   acetaminophen (TYLENOL) 500 MG tablet Take 1 tablet (500 mg total) by mouth every 6 (six) hours as needed (pain). 30 tablet 0   Blood Glucose Monitoring Suppl (ONE TOUCH ULTRA 2) w/Device KIT Use to test blood sugar three times daily. Dx: E11.9 100 each 11   CHLOROPHYLL PO Take 15 mLs by mouth daily. Vegan liquid     Cholecalciferol (VITAMIN D) 50 MCG (2000 UT) CAPS Take 1 capsule by mouth daily.     clonazePAM (KLONOPIN) 0.5 MG tablet Take 1 tablet (0.5 mg total) by mouth 2 (two) times daily as needed. for anxiety (Patient taking differently: Take 0.5 mg by mouth 2 (two) times daily as needed. for anxiety) 60 tablet 0   glucose blood (ONE TOUCH ULTRA TEST) test strip Use to test blood sugar three times daily. Dx:E11.9 100 each 11   Lancets (ONETOUCH ULTRASOFT) lancets Use to check blood sugar three times daily. Dx:E11.9 100 each 11   lisinopril (ZESTRIL) 5 MG tablet Take 1 tablet (5 mg total) by mouth daily. (Patient taking differently: Take 5 mg by mouth daily.) 90 tablet 3   metFORMIN (GLUCOPHAGE) 500 MG tablet TAKE 1 TABLET BY MOUTH TWICE DAILY WITH A MEAL for diabetes (Patient taking differently: Take 500 mg by mouth 2 (two) times daily with a meal. TAKE 1 TABLET BY MOUTH TWICE DAILY WITH A MEAL for diabetes) 180 tablet 1   Polyethyl Glycol-Propyl Glycol (Michelle Robertson FREE OP) Apply to eye as needed.     atorvastatin (LIPITOR) 20 MG tablet Take 1 tablet (20 mg total) by mouth daily. 90 tablet 3   No current facility-administered medications for this visit.    Allergies:   Patient has no known allergies.     Social History:  The patient  reports that she has never smoked. She has never used smokeless tobacco. She reports that she does not drink alcohol and does not use drugs.   Family History:  The patient's family history includes Cancer in her father; Diabetes in her maternal aunt, mother, and paternal grandmother; Stroke in her mother.    ROS:   Please see the history of present illness. (+) Right LE weakness, heaviness, and pain (+) Fatigue (+) Chest pain (+) acid reflux (+) Back pain All other systems are reviewed and negative.     PHYSICAL EXAM:  VS:  BP 118/73 (BP Location: Left Arm, Patient Position: Sitting, Cuff Size: Normal)    Pulse 79    Ht 5' 2.5" (1.588 m)    Wt 133 lb 14.4 oz (60.7 kg)    SpO2 100%    BMI 24.10 kg/m  , BMI Body mass index is 24.1 kg/m. GENERAL:  Well appearing HEENT: Pupils equal round and reactive, fundi not visualized, oral mucosa unremarkable NECK:  No jugular venous distention, waveform within normal limits, carotid upstroke brisk and symmetric, no bruits, no thyromegaly LUNGS:  Clear to auscultation bilaterally HEART:  RRR.  PMI not displaced or sustained,S1 and S2 within normal limits, no S3, no S4, no clicks, no rubs, no murmurs ABD:  Flat, positive bowel sounds normal in frequency in pitch, no bruits, no rebound, no guarding, no midline pulsatile mass, no hepatomegaly, no splenomegaly EXT:  2 plus pulses throughout, no edema, no cyanosis no clubbing SKIN:  No rashes no nodules NEURO:  Cranial nerves II through XII grossly intact, motor grossly intact throughout PSYCH:  Cognitively intact, oriented to person place and time   EKG:   03/12/16: Sinus bradycardia.  Rate 59 bpm.   12/03/17: Sinus rhythm.  Rate 63 bpm.   07/08/20: Sinus rhythm.  Rate 71 bpm.  Nonspecific T wave abnormality. 09/14/2021: EKG was not ordered today.  7 Day Event Monitor 03/20/16:   Quality: Fair.  Baseline artifact. Predominant rhythm: sinus rhythm, sinus  bradycardia Average heart rate: 70 bpm Max heart rate: 121 bpm Min heart rate: 47 bpm Pauses >2.5 seconds: 0   Occasional PACs.  Echo 10/06/21:  1. Left ventricular ejection fraction, by estimation, is 60 to 65%. The  left ventricle has normal function. The left ventricle has no regional  wall motion abnormalities. Left ventricular diastolic parameters are  consistent with Grade I diastolic  dysfunction (impaired relaxation). The average left ventricular global  longitudinal strain is -18.8 %. The global longitudinal strain is normal.   2. Right ventricular systolic function is normal. The right ventricular  size is normal.   3. The mitral valve is normal in structure. Trivial mitral valve  regurgitation. No evidence of mitral stenosis.   4. The aortic valve is tricuspid. Aortic valve regurgitation is not  visualized. No aortic stenosis is present.   5. The inferior vena cava is normal in size with greater than 50%  respiratory variability, suggesting right atrial pressure of 3 mmHg.   Recent Labs: 09/09/2021: ALT 15; Hemoglobin 11.5; Platelets 283 10/23/2021: BUN 13; Creatinine, Ser 1.00; Potassium 4.1; Sodium 136    Lipid Panel    Component Value Date/Time   CHOL 251 (H) 08/01/2018 1400   CHOL 245 (H) 02/19/2018 1030   TRIG 213 (H) 08/01/2018 1400   HDL 45 (L) 08/01/2018 1400   HDL 52 02/19/2018 1030   CHOLHDL 5.6 (H) 08/01/2018 1400   VLDL 20 09/13/2016 0817   LDLCALC 168 (H) 08/01/2018 1400   LDLDIRECT 134.0 07/19/2015 1541      Wt Readings from Last 3 Encounters:  12/01/21 133 lb 14.4 oz (60.7 kg)  11/01/21 129 lb (58.5 kg)  10/23/21 131 lb 3.2 oz (59.5 kg)      ASSESSMENT AND PLAN: PAC (premature atrial contraction) Stable.  She has not required any nodal agents lately.  Essential hypertension Blood pressure is well controlled on lisinopril.  Hyperlipidemia Continue atorvastatin.    Current medicines are reviewed at length with the patient today.   The patient does not have concerns regarding  medicines.  The following changes have been made: None  Labs/ tests ordered today include:   No orders of the defined types were placed in this encounter.    Disposition:   FU with Kiwan Gadsden C. Oval Linsey, MD, [facility] in 2-3 months.   Signed, Tanairy Payeur C. Oval Linsey, MD, [facility]  12/05/2021 6:06 PM    [facility]

## 2021-12-01 NOTE — Patient Instructions (Addendum)
Medication Instructions:  Your physician recommends that you continue on your current medications as directed. Please refer to the Current Medication list given to you today.   *If you need a refill on your cardiac medications before your next appointment, please call your pharmacy*  Lab Work: NONE   Testing/Procedures: NONE  Follow-Up: At [facility], you and your health needs are our priority.  As part of our continuing mission to provide you with exceptional heart care, we have created designated Provider Care Teams.  These Care Teams include your primary Cardiologist (physician) and Advanced Practice Providers (APPs -  Physician Assistants and Nurse Practitioners) who all work together to provide you with the care you need, when you need it.  We recommend signing up for the patient portal called "MyChart".  Sign up information is provided on this After Visit Summary.  MyChart is used to connect with patients for Virtual Visits (Telemedicine).  Patients are able to view lab/test results, encounter notes, upcoming appointments, etc.  Non-urgent messages can be sent to your provider as well.   To learn more about what you can do with MyChart, go to [email or web address].    Your next appointment:   12 month(s)  The format for your next appointment:   In Person  Provider:    DR [facility]   Other Instructions  Consider seeing Dr. Sammuel Hines for your back.

## 2021-12-05 ENCOUNTER — Other Ambulatory Visit: Payer: Self-pay

## 2021-12-05 ENCOUNTER — Encounter: Payer: Self-pay | Admitting: Obstetrics and Gynecology

## 2021-12-05 ENCOUNTER — Encounter (HOSPITAL_BASED_OUTPATIENT_CLINIC_OR_DEPARTMENT_OTHER): Payer: Self-pay | Admitting: Cardiovascular Disease

## 2021-12-05 ENCOUNTER — Ambulatory Visit (INDEPENDENT_AMBULATORY_CARE_PROVIDER_SITE_OTHER): Payer: Medicare Other | Admitting: Obstetrics and Gynecology

## 2021-12-05 VITALS — BP 150/56 | HR 79

## 2021-12-05 DIAGNOSIS — N8110 Cystocele, unspecified: Secondary | ICD-10-CM

## 2021-12-05 DIAGNOSIS — N812 Incomplete uterovaginal prolapse: Secondary | ICD-10-CM

## 2021-12-05 NOTE — Assessment & Plan Note (Signed)
Blood pressure is well controlled on lisinopril. 

## 2021-12-05 NOTE — Patient Instructions (Signed)

## 2021-12-05 NOTE — Assessment & Plan Note (Signed)
Continue atorvastatin

## 2021-12-05 NOTE — Assessment & Plan Note (Signed)
Stable.  She has not required any nodal agents lately.

## 2021-12-05 NOTE — Progress Notes (Signed)
[facility]  Date of Visit: 12/05/2021  History of Present Illness: Ms. Hourihan is a 71 y.o. female scheduled today for a post-operative visit.   Surgery: s/p posterior repair on 10/23/21 Post-operatively she noted another vaginal bulge and on exam had apical and anterior vaginal wall prolapse.   Today she reports she is feeling well overall. Still feels a bulge, some days worse than others.   UTI in the last 6 weeks? No  Pain? No  She has returned to her normal activity (except for postop restrictions) Vaginal bulge? Yes  Stress incontinence: No  Urgency/frequency: No  Urge incontinence: No  Voiding dysfunction: No  Bowel issues: No   Subjective Success: Do you usually have a bulge or something falling out that you can see or feel in the vaginal area? Yes  Retreatment Success: Any retreatment with surgery or pessary for any compartment? No    Medications: She has a current medication list which includes the following prescription(s): acetaminophen, atorvastatin, one touch ultra 2, chlorophyll, vitamin d, clonazepam, glucose blood, onetouch ultrasoft, lisinopril, metformin, and polyethyl glycol-propyl glycol.   Allergies: Patient has No Known Allergies.   Physical Exam: BP (!) 150/56    Pulse 79   Abdomen: soft, non-tender, without masses or organomegaly Pelvic Examination: Vagina: Incisions healing well. Sutures are not present at incision line. No tenderness along the anterior or posterior vagina. No apical tenderness. No pelvic masses.   POP-Q  -1                                            Aa   -1                                           Ba  -6                                              C   4                                            Gh  4                                            Pb  8                                            tvl   -3                                            Ap  -3  Bp  -7                                               D     POP-Q (11/01/21):  POP-Q   -0.5                                            Aa   -0.5                                           Ba   -4.5                                              C    3.5                                            Gh   3                                            Pb   8                                            tvl    -3                                            Ap   -3                                            Bp   -6.5                                              D    ---------------------------------------------------------  Assessment and Plan:  1. Uterovaginal prolapse, incomplete   2. Prolapse of anterior vaginal wall    - Well healed from posterior repair surgery.  - We discussed treatment options for prolapse in the anterior/ apical compartments:   We discussed two options for prolapse repair:  1) vaginal repair without mesh - Pros - safer, no mesh complications - Cons - not as strong as mesh repair, higher risk of recurrence  2) laparoscopic repair with mesh - Pros - stronger, better long-term success - Cons - risks of mesh implant (erosion into vagina or bladder, adhering to the rectum, pain) - these risks are lower  than with a vaginal mesh but still exist - She is interested in the stronger repair- handout provided on sacrocolpopexy - She will undergo urodynamics to test for occult incontinence.   All questions answered.  [name], MD

## 2021-12-12 ENCOUNTER — Ambulatory Visit (INDEPENDENT_AMBULATORY_CARE_PROVIDER_SITE_OTHER): Payer: Commercial Managed Care - HMO | Admitting: Obstetrics and Gynecology

## 2021-12-12 ENCOUNTER — Other Ambulatory Visit: Payer: Self-pay

## 2021-12-12 VITALS — BP 132/80 | HR 84 | Ht 62.5 in | Wt 131.0 lb

## 2021-12-12 DIAGNOSIS — N3281 Overactive bladder: Secondary | ICD-10-CM

## 2021-12-12 DIAGNOSIS — R350 Frequency of micturition: Secondary | ICD-10-CM | POA: Diagnosis not present

## 2021-12-12 LAB — POCT URINALYSIS DIPSTICK
Appearance: NORMAL
Bilirubin, UA: NEGATIVE
Glucose, UA: NEGATIVE
Ketones, UA: NEGATIVE
Leukocytes, UA: NEGATIVE
Nitrite, UA: NEGATIVE
Protein, UA: NEGATIVE
Spec Grav, UA: 1.025 (ref 1.010–1.025)
Urobilinogen, UA: 0.2 E.U./dL
pH, UA: 5.5 (ref 5.0–8.0)

## 2021-12-12 NOTE — Patient Instructions (Signed)

## 2021-12-14 NOTE — Progress Notes (Signed)
[facility] Urodynamics Procedure  Referring Physician: Sonia Side., FNP Date of Procedure: 12/12/2021  Michelle Robertson is a 71 y.o. female who presents for urodynamic evaluation. Indication(s) for study: occult SUI  Vital Signs: BP 132/80    Pulse 84    Ht 5' 2.5" (1.588 m)    Wt 131 lb (59.4 kg)    BMI 23.58 kg/m   Laboratory Results: A catheterized urine specimen revealed:  POC urine: moderate blood, otherwise negative   Voiding Diary: Not performed  Procedure Timeout:  The correct patient was verified and the correct procedure was verified. The patient was in the correct position and safety precautions were reviewed based on at the patient's history.  Urodynamic Procedure A 35F dual lumen urodynamics catheter was placed under sterile conditions into the patient's bladder. A 35F catheter was placed into the rectum in order to measure abdominal pressure. EMG patches were placed in the appropriate position.  All connections were confirmed and calibrations/adjusted made. Saline was instilled into the bladder through the dual lumen catheters.  Cough/valsalva pressures were measured periodically during filling.  Patient was allowed to void.  The bladder was then emptied of its residual.  UROFLOW: Revealed a Qmax of 3.1 mL/sec.  She voided 18 mL and had a residual of 3 mL.  It was a normal pattern and represented normal habits though interpretation limited due to low voided volume.  CMG: This was performed with sterile water in the sitting position at a fill rate of 20- 30 mL/min.    First sensation of fullness was 18 mLs,  First urge was 67 mLs,  Strong urge was 99 mLs and  Capacity was 175 mLs  Stress incontinence was not demonstrated Highest negative Barrier CLPP was 117 cmH20 at 170 ml. Highest negative Barrier VLPP was 157 cmH20 at 170 ml.  Detrusor function was overactive, with phasic contractions seen.  The first occurred at 98 mL to 5.3 cm of water and  was associated with urge.  Compliance:  normal. End fill detrusor pressure was -16cmH20.    UPP: MUCP with barrier reduction was 76 cm of water.    MICTURITION STUDY: Voiding was performed with reduction using scopettes in the sitting position.  Pdet at Qmax was 20 cm of water.  Qmax was 14.5 mL/sec.  It was a intermittent pattern.  She voided 170 mL and had a residual of 5 mL.  It was a volitional void, sustained detrusor contraction was present and abdominal straining was not present  EMG: This was performed with patches.  She had voluntary contractions, recruitment with fill was present and urethral sphincter was relaxed with void.  The details of the procedure with the study tracings have been scanned into EPIC.   Urodynamic Impression:  1. Sensation was increased; capacity was reduced 2. Stress Incontinence was not demonstrated. 3. Detrusor Overactivity was demonstrated without leakage. 4. Emptying was normal with a normal PVR, a sustained detrusor contraction present,  abdominal straining not present, normal urethral sphincter activity on EMG.  Plan: - The patient will follow up  to discuss the findings and treatment options.   [name], MD

## 2022-01-01 ENCOUNTER — Other Ambulatory Visit: Payer: Self-pay

## 2022-01-01 ENCOUNTER — Ambulatory Visit (INDEPENDENT_AMBULATORY_CARE_PROVIDER_SITE_OTHER): Payer: Medicare Other | Admitting: Obstetrics and Gynecology

## 2022-01-01 ENCOUNTER — Encounter: Payer: Self-pay | Admitting: Obstetrics and Gynecology

## 2022-01-01 VITALS — BP 164/74 | HR 81

## 2022-01-01 DIAGNOSIS — N812 Incomplete uterovaginal prolapse: Secondary | ICD-10-CM | POA: Diagnosis not present

## 2022-01-01 DIAGNOSIS — N8110 Cystocele, unspecified: Secondary | ICD-10-CM | POA: Diagnosis not present

## 2022-01-01 DIAGNOSIS — R3915 Urgency of urination: Secondary | ICD-10-CM | POA: Diagnosis not present

## 2022-01-01 NOTE — Progress Notes (Signed)
[facility] Return Visit  SUBJECTIVE  History of Present Illness: Michelle Robertson is a 71 y.o. female seen in follow-up after urodynamic testing to discuss surgery.   Urodynamic Impression:  1. Sensation was increased; capacity was reduced 2. Stress Incontinence was not demonstrated. 3. Detrusor Overactivity was demonstrated without leakage. 4. Emptying was normal with a normal PVR, a sustained detrusor contraction present,  abdominal straining not present, normal urethral sphincter activity on EMG.  She reports that she has been having bladder urgency.   Past Medical History: Patient  has a past medical history of Anxiety, Arthritis, Carotid stenosis, asymptomatic, bilateral (12/2017), Essential hypertension, Full dentures, GERD (gastroesophageal reflux disease), Heart palpitations (2017), Hiatal hernia, History of chest pain, History of COVID-19 (06/2021), History of ectopic pregnancy (1991), History of panic attacks, IDA (iron deficiency anemia), OSA (obstructive sleep apnea), PAC (premature atrial contraction) (2017), Posterior vaginal wall prolapse, Presence of pessary, SOB (shortness of breath) on exertion, Type 2 diabetes mellitus ([place]), and Wears glasses.   Past Surgical History: She  has a past surgical history that includes Tubal ligation (Bilateral); Ectopic pregnancy surgery (1991); and Rectocele repair (N/A, 10/23/2021).   Medications: She has a current medication list which includes the following prescription(s): atorvastatin, one touch ultra 2, chlorophyll, vitamin d, clonazepam, glucose blood, onetouch ultrasoft, lisinopril, metformin, and polyethyl glycol-propyl glycol.   Allergies: Patient has No Known Allergies.   Social History: Patient  reports that she has never smoked. She has never used smokeless tobacco. She reports that she does not drink alcohol and does not use drugs.      OBJECTIVE     Physical Exam: Vitals:   01/01/22 1407  BP: (!) 164/74   Pulse: 81   Gen: No apparent distress, A&O x 3.  Detailed Urogynecologic Evaluation:  Deferred. Prior exam showed:  POP-Q   -1                                            Aa   -1                                           Ba   -6                                              C    4                                            Gh   4                                            Pb   8                                            tvl    -  3                                            Ap   -3                                            Bp   -7                                              D      ASSESSMENT AND PLAN    Michelle Robertson is a 71 y.o. with:  1. Uterovaginal prolapse, incomplete   2. Prolapse of anterior vaginal wall   3. Urinary urgency    - We reviewed that bladder urgency may not improve after her procedure. She does not want to start a medication at this time.   Plan for surgery: Exam under anesthesia, total laparoscopic hysterectomy, bilateral salpingo-oophorectomy, sacrocolpopexy, cystoscopy  - We reviewed the patient's specific anatomic and functional findings, with the assistance of diagrams, and together finalized the above procedure. The planned surgical procedures were discussed along with the surgical risks outlined below, which were also provided on a detailed handout. Additional treatment options including expectant management, conservative management, medical management were discussed where appropriate.  We reviewed the benefits and risks of each treatment option.   General Surgical Risks: For all procedures, there are risks of bleeding, infection, damage to surrounding organs including but not limited to bowel, bladder, blood vessels, ureters and nerves, and need for further surgery if an injury were to occur. These risks are all low with minimally invasive surgery.    Prolapse (with or without mesh): Risk factors for surgical failure  include things that put  pressure on your pelvis and the surgical repair, including obesity, chronic cough, and heavy lifting or straining (including lifting children or adults, straining on the toilet, or lifting heavy objects such as furniture or anything weighing >25 lbs. Risks of recurrence is 20-30% with vaginal native tissue repair and a less than 10% with sacrocolpopexy with mesh.    Sacrocolpopexy: Mesh implants may provide more prolapse support, but do have some unique risks to consider. It is important to understand that mesh is permanent and cannot be easily removed. Risks of abdominal sacrocolpopexy mesh include mesh exposure (~3-6%), painful intercourse (recent studies show lower rates after surgery compared to before, with ~5-8% risk of new onset), and very rare risks of bowel or bladder injury or infection (<1%). The risk of mesh exposure is more likely in a woman with risks for poor healing (prior radiation, poorly controlled diabetes, or immunocompromised). The risk of new or worsened chronic pain after mesh implant is more common in women with baseline chronic pain and/or poorly controlled anxiety or depression. There is an FDA safety notification on vaginal mesh procedures for prolapse but NOT abdominal mesh procedures and therefore does not apply to your surgery. We have extensive experience and training with mesh placement and we have close postoperative follow up to identify any potential complications from mesh.    - For preop Visit:  She is required to have a visit within 30 days of  her surgery.    - Medical clearance: required Letter sent to PCP- FNP Dustin Folks requesting risk stratification and medical optimization along with most recent A1c. - Anticoagulant use: No - Medicaid Hysterectomy form: No - Accepts blood transfusion: Yes - Expected length of stay: outpatient  Request sent for surgery scheduling.   [name], MD  Time spent: I spent 25 minutes dedicated to the care of this  patient on the date of this encounter to include pre-visit review of records, face-to-face time with the patient and post visit documentation.

## 2022-02-09 ENCOUNTER — Ambulatory Visit (INDEPENDENT_AMBULATORY_CARE_PROVIDER_SITE_OTHER): Payer: Medicare Other | Admitting: Obstetrics and Gynecology

## 2022-02-09 ENCOUNTER — Encounter: Payer: Self-pay | Admitting: Obstetrics and Gynecology

## 2022-02-09 ENCOUNTER — Other Ambulatory Visit: Payer: Self-pay

## 2022-02-09 VITALS — BP 146/81 | HR 70

## 2022-02-09 DIAGNOSIS — Z01818 Encounter for other preprocedural examination: Secondary | ICD-10-CM

## 2022-02-09 MED ORDER — IBUPROFEN 600 MG PO TABS: 600.0000 mg | ORAL_TABLET | Freq: Four times a day (QID) | ORAL | 0 refills | Status: DC | PRN

## 2022-02-09 MED ORDER — OXYCODONE HCL 5 MG PO TABS: 5.0000 mg | ORAL_TABLET | ORAL | 0 refills | Status: DC | PRN

## 2022-02-09 MED ORDER — ACETAMINOPHEN 500 MG PO TABS: 500.0000 mg | ORAL_TABLET | Freq: Four times a day (QID) | ORAL | 0 refills | Status: DC | PRN

## 2022-02-09 MED ORDER — POLYETHYLENE GLYCOL 3350 17 GM/SCOOP PO POWD: 17.0000 g | Freq: Every day | ORAL | 0 refills | Status: AC

## 2022-02-09 NOTE — Progress Notes (Signed)
[facility] ?Pre-Operative H&P ? ?Subjective ?Chief Complaint: Michelle Robertson presents for a preoperative encounter.  ? ?History of Present Illness: ?Michelle Robertson is a 71 y.o. female who presents for preoperative visit.  She is scheduled to undergo Exam under anesthesia, total laparoscopic hysterectomy, bilateral salpingo-oophorectomy, sacrocolpopexy, cystoscopy, possible sacrospinous ligament fixation and anterior repair if robotic surgery not possible  ?  on 03/02/22.  Her symptoms include vaginal bulge, and she was was found to have Stage II anterior, Stage I posterior, Stage I apical prolapse. ? ?Urodynamics showed: ?Urodynamic Impression:  ?1. Sensation was increased; capacity was reduced ?2. Stress Incontinence was not demonstrated. ?3. Detrusor Overactivity was demonstrated without leakage. ?4. Emptying was normal with a normal PVR, a sustained detrusor contraction present,  abdominal straining not present, normal urethral sphincter activity on EMG. ? ?Past Medical History:  ?Diagnosis Date  ? Anxiety   ? Arthritis   ? Carotid stenosis, asymptomatic, bilateral 12/2017  ? last ultrasound in epic 01-01-2018  bilateral ICA 40-59%  ? Essential hypertension   ? Full dentures   ? GERD (gastroesophageal reflux disease)   ? Heart palpitations 2017  ? cardiology --- dr t. Oval Linsey;  (10-18-2021  per intermittant palpitations) event monitor 03-20-2016 SR/ SB w/ occasional PAC and 12 lead ekg showed PVC  ? Hiatal hernia   ? History of chest pain   ? followed by cardiology-- dr t. Oval Linsey;  (10-18-2021  oer pt no chest pain since 09-09-2021 & cardiology 09-14-2021 note in epic) CCT -- 12/ 2013 no coronary artery calcification/ atheroscloersis;  02/ 2019 nulcear stresst test, normal perfusion no ischemia, nuclear ef 55%;  02/ 2019  carotid US, bilateral ICA 40-59% stenosis;  last echo 11/ 2022 ef 60-65%, G1DD, trivial MR;  CTA 10/ 2022 normal  ? History of COVID-19 06/2021  ? per pt mild symptoms that  resolved  ? History of ectopic pregnancy 1991  ? s/p surgery  ? History of panic attacks   ? IDA (iron deficiency anemia)   ? OSA (obstructive sleep apnea)   ? 10-18-2021  per pt had study 2 months ago, waiting on cpap due to being on backorder  ? PAC (premature atrial contraction) 2017  ? occasional per event monitor 05/ 2017  ? Posterior vaginal wall prolapse   ? Presence of pessary   ? SOB (shortness of breath) on exertion   ? per pt with stairs, house chores without sob, and long distance walking with occasional sob  ? Type 2 diabetes mellitus ([place])   ? followed by pcp   (10-18-2021  pt stated check blood sugar 1-2 times daily,  fasting sugar-- 90--121)  ? Wears glasses   ?  ? ?Past Surgical History:  ?Procedure Laterality Date  ? ECTOPIC PREGNANCY SURGERY  1991  ? EXPLORATORY LAPAROTOMY    ? for bowel obstruction  ? RECTOCELE REPAIR N/A 10/23/2021  ? Procedure: POSTERIOR REPAIR (RECTOCELE);  Surgeon: [name], MD;  Location: [facility];  Service: Gynecology;  Laterality: N/A;  ? TUBAL LIGATION Bilateral   ? yrs ago  ? ? ?has No Known Allergies.  ? ?Family History  ?Problem Relation Age of Onset  ? Diabetes Mother   ? Stroke Mother   ? Diabetes Maternal Aunt   ? Diabetes Paternal Grandmother   ? Cancer Father   ? ? ?Social History  ? ?Tobacco Use  ? Smoking status: Never  ? Smokeless tobacco: Never  ?Vaping Use  ? Vaping Use: Never used  ?  Substance Use Topics  ? Alcohol use: No  ? Drug use: Never  ? ? ? ?Review of Systems was negative for a full 10 system review except as noted in the History of Present Illness. ? ? ?Current Outpatient Medications:  ?  acetaminophen (TYLENOL) 500 MG tablet, Take 1 tablet (500 mg total) by mouth every 6 (six) hours as needed (pain)., Disp: 30 tablet, Rfl: 0 ?  ibuprofen (ADVIL) 600 MG tablet, Take 1 tablet (600 mg total) by mouth every 6 (six) hours as needed., Disp: 30 tablet, Rfl: 0 ?  oxyCODONE (OXY IR/ROXICODONE) 5 MG immediate release tablet,  Take 1 tablet (5 mg total) by mouth every 4 (four) hours as needed for severe pain., Disp: 15 tablet, Rfl: 0 ?  polyethylene glycol powder (GLYCOLAX/MIRALAX) 17 GM/SCOOP powder, Take 17 g by mouth daily. Drink 17g (1 scoop) dissolved in water per day., Disp: 255 g, Rfl: 0 ?  atorvastatin (LIPITOR) 20 MG tablet, Take 1 tablet (20 mg total) by mouth daily., Disp: 90 tablet, Rfl: 3 ?  Blood Glucose Monitoring Suppl (ONE TOUCH ULTRA 2) w/Device KIT, Use to test blood sugar three times daily. Dx: E11.9, Disp: 100 each, Rfl: 11 ?  CHLOROPHYLL PO, Take 15 mLs by mouth daily. Vegan liquid, Disp: , Rfl:  ?  Cholecalciferol (VITAMIN D) 50 MCG (2000 UT) CAPS, Take 1 capsule by mouth daily., Disp: , Rfl:  ?  clonazePAM (KLONOPIN) 0.5 MG tablet, Take 1 tablet (0.5 mg total) by mouth 2 (two) times daily as needed. for anxiety (Patient taking differently: Take 0.5 mg by mouth 2 (two) times daily as needed. for anxiety), Disp: 60 tablet, Rfl: 0 ?  glucose blood (ONE TOUCH ULTRA TEST) test strip, Use to test blood sugar three times daily. Dx:E11.9, Disp: 100 each, Rfl: 11 ?  Lancets (ONETOUCH ULTRASOFT) lancets, Use to check blood sugar three times daily. Dx:E11.9, Disp: 100 each, Rfl: 11 ?  lisinopril (ZESTRIL) 5 MG tablet, Take 1 tablet (5 mg total) by mouth daily. (Patient taking differently: Take 5 mg by mouth daily.), Disp: 90 tablet, Rfl: 3 ?  metFORMIN (GLUCOPHAGE) 500 MG tablet, TAKE 1 TABLET BY MOUTH TWICE DAILY WITH A MEAL for diabetes (Patient taking differently: Take 500 mg by mouth 2 (two) times daily with a meal. TAKE 1 TABLET BY MOUTH TWICE DAILY WITH A MEAL for diabetes), Disp: 180 tablet, Rfl: 1 ?  Polyethyl Glycol-Propyl Glycol (SYSTANE FREE OP), Apply to eye as needed., Disp: , Rfl:   ? ?Objective ?Vitals:  ? 02/09/22 1531  ?BP: (!) 146/81  ?Pulse: 70  ? ? ?Gen: NAD ?CV: S1 S2 RRR ?Lungs: Clear to auscultation bilaterally ?Abd: soft, nontender, midline scar, absent umbilicus ? ? ?Previous Pelvic Exam  showed: ?POP-Q ?  ?-1  ?                                          Aa   ?-1 ?                                          Ba   ?-6  ?  C  ?  ?4  ?                                          Gh   ?4  ?                                          Pb   ?8  ?                                          tvl  ?  ?-3  ?                                          Ap   ?-3  ?                                          Bp   ?-7  ?                                            D  ?  ? ? ? ?Assessment/ Plan ? ?Assessment: ?The patient is a 71 y.o. year old scheduled to undergo Exam under anesthesia, total laparoscopic hysterectomy, bilateral salpingo-oophorectomy, sacrocolpopexy, cystoscopy, possible sacrospinous ligament fixation and anterior repair if robotic surgery not possible. . Verbal consent was obtained for these procedures. ? ?Plan: ?General Surgical Consent: ?The patient has previously been counseled on alternative treatments, and the decision by the patient and provider was to proceed with the procedure listed above. ? ?For all procedures, there are risks of bleeding, infection, damage to surrounding organs including but not limited to bowel, bladder, blood vessels, ureters and nerves, and need for further surgery if an injury were to occur. These risks are all low with minimally invasive surgery.  ? ?There are risks of numbness and weakness at any body site or buttock/rectal pain.  It is possible that baseline pain can be worsened by surgery, either with or without mesh. If surgery is vaginal, there is also a low risk of possible conversion to laparoscopy or open abdominal incision where indicated. Very rare risks include blood transfusion, blood clot, heart attack, pneumonia, or death.  ? ?There is also a risk of short-term postoperative urinary retention with need to use a catheter. About half of patients need to go home from surgery with a catheter, which is then later removed in the  office. The risk of long-term need for a catheter is very low. There is also a risk of worsening of overactive bladder.  ? ? ? Prolapse (with or without mesh): ?Risk factors for surgical failure  include things that put press

## 2022-02-14 ENCOUNTER — Encounter (HOSPITAL_BASED_OUTPATIENT_CLINIC_OR_DEPARTMENT_OTHER): Payer: Self-pay | Admitting: Obstetrics and Gynecology

## 2022-02-14 NOTE — H&P (Signed)
[facility] ?Pre-Operative H&P ? ?Subjective ?Chief Complaint: Michelle Robertson presents for a preoperative encounter.  ? ?History of Present Illness: ?Michelle Robertson is a 71 y.o. female who presents for preoperative visit.  She is scheduled to undergo Exam under anesthesia, total laparoscopic hysterectomy, bilateral salpingo-oophorectomy, sacrocolpopexy, cystoscopy, possible sacrospinous ligament fixation and anterior repair if robotic surgery not possible  ?  on 03/02/22.  Her symptoms include vaginal bulge, and she was was found to have Stage II anterior, Stage I posterior, Stage I apical prolapse. We discussed that due to history of bowel obstruction with midline abdominal incision we may need to revert to vaginal procedure if robotic procedure not possible due to adhesions.  ? ?Urodynamics showed: ?Urodynamic Impression:  ?1. Sensation was increased; capacity was reduced ?2. Stress Incontinence was not demonstrated. ?3. Detrusor Overactivity was demonstrated without leakage. ?4. Emptying was normal with a normal PVR, a sustained detrusor contraction present,  abdominal straining not present, normal urethral sphincter activity on EMG. ? ?Past Medical History:  ?Diagnosis Date  ? Anxiety   ? Arthritis   ? Carotid stenosis, asymptomatic, bilateral 12/2017  ? last ultrasound in epic 01-01-2018  bilateral ICA 40-59%  ? Essential hypertension   ? Full dentures   ? GERD (gastroesophageal reflux disease)   ? Heart palpitations 2017  ? cardiology --- dr t. Oval Linsey;  (10-18-2021  per intermittant palpitations) event monitor 03-20-2016 SR/ SB w/ occasional PAC and 12 lead ekg showed PVC  ? Hiatal hernia   ? History of chest pain   ? followed by cardiology-- dr t. Oval Linsey;  (10-18-2021  oer pt no chest pain since 09-09-2021 & cardiology 09-14-2021 note in epic) CCT -- 12/ 2013 no coronary artery calcification/ atheroscloersis;  02/ 2019 nulcear stresst test, normal perfusion no ischemia, nuclear ef 55%;  02/  2019  carotid US, bilateral ICA 40-59% stenosis;  last echo 11/ 2022 ef 60-65%, G1DD, trivial MR;  CTA 10/ 2022 normal  ? History of COVID-19 06/2021  ? per pt mild symptoms that resolved  ? History of ectopic pregnancy 1991  ? s/p surgery  ? History of panic attacks   ? IDA (iron deficiency anemia)   ? OSA (obstructive sleep apnea)   ? 10-18-2021  per pt had study 2 months ago, waiting on cpap due to being on backorder  ? PAC (premature atrial contraction) 2017  ? occasional per event monitor 05/ 2017  ? Posterior vaginal wall prolapse   ? Presence of pessary   ? SOB (shortness of breath) on exertion   ? per pt with stairs, house chores without sob, and long distance walking with occasional sob  ? Type 2 diabetes mellitus ([place])   ? followed by pcp   (10-18-2021  pt stated check blood sugar 1-2 times daily,  fasting sugar-- 90--121)  ? Wears glasses   ?  ? ?Past Surgical History:  ?Procedure Laterality Date  ? ECTOPIC PREGNANCY SURGERY  1991  ? EXPLORATORY LAPAROTOMY    ? for bowel obstruction  ? RECTOCELE REPAIR N/A 10/23/2021  ? Procedure: POSTERIOR REPAIR (RECTOCELE);  Surgeon: [name], MD;  Location: [facility];  Service: Gynecology;  Laterality: N/A;  ? TUBAL LIGATION Bilateral   ? yrs ago  ? ? ?has No Known Allergies.  ? ?Family History  ?Problem Relation Age of Onset  ? Diabetes Mother   ? Stroke Mother   ? Diabetes Maternal Aunt   ? Diabetes Paternal Grandmother   ? Cancer  Father   ? ? ?Social History  ? ?Tobacco Use  ? Smoking status: Never  ? Smokeless tobacco: Never  ?Vaping Use  ? Vaping Use: Never used  ?Substance Use Topics  ? Alcohol use: No  ? Drug use: Never  ? ? ? ?Review of Systems was negative for a full 10 system review except as noted in the History of Present Illness. ? ?No current facility-administered medications for this encounter. ? ?Current Outpatient Medications:  ?  acetaminophen (TYLENOL) 500 MG tablet, Take 1 tablet (500 mg total) by mouth every 6 (six)  hours as needed (pain)., Disp: 30 tablet, Rfl: 0 ?  atorvastatin (LIPITOR) 20 MG tablet, Take 1 tablet (20 mg total) by mouth daily., Disp: 90 tablet, Rfl: 3 ?  Blood Glucose Monitoring Suppl (ONE TOUCH ULTRA 2) w/Device KIT, Use to test blood sugar three times daily. Dx: E11.9, Disp: 100 each, Rfl: 11 ?  CHLOROPHYLL PO, Take 15 mLs by mouth daily. Vegan liquid, Disp: , Rfl:  ?  Cholecalciferol (VITAMIN D) 50 MCG (2000 UT) CAPS, Take 1 capsule by mouth daily., Disp: , Rfl:  ?  clonazePAM (KLONOPIN) 0.5 MG tablet, Take 1 tablet (0.5 mg total) by mouth 2 (two) times daily as needed. for anxiety (Patient taking differently: Take 0.5 mg by mouth 2 (two) times daily as needed. for anxiety), Disp: 60 tablet, Rfl: 0 ?  glucose blood (ONE TOUCH ULTRA TEST) test strip, Use to test blood sugar three times daily. Dx:E11.9, Disp: 100 each, Rfl: 11 ?  ibuprofen (ADVIL) 600 MG tablet, Take 1 tablet (600 mg total) by mouth every 6 (six) hours as needed., Disp: 30 tablet, Rfl: 0 ?  Lancets (ONETOUCH ULTRASOFT) lancets, Use to check blood sugar three times daily. Dx:E11.9, Disp: 100 each, Rfl: 11 ?  lisinopril (ZESTRIL) 5 MG tablet, Take 1 tablet (5 mg total) by mouth daily. (Patient taking differently: Take 5 mg by mouth daily.), Disp: 90 tablet, Rfl: 3 ?  metFORMIN (GLUCOPHAGE) 500 MG tablet, TAKE 1 TABLET BY MOUTH TWICE DAILY WITH A MEAL for diabetes (Patient taking differently: Take 500 mg by mouth 2 (two) times daily with a meal. TAKE 1 TABLET BY MOUTH TWICE DAILY WITH A MEAL for diabetes), Disp: 180 tablet, Rfl: 1 ?  oxyCODONE (OXY IR/ROXICODONE) 5 MG immediate release tablet, Take 1 tablet (5 mg total) by mouth every 4 (four) hours as needed for severe pain., Disp: 15 tablet, Rfl: 0 ?  Polyethyl Glycol-Propyl Glycol (SYSTANE FREE OP), Apply to eye as needed., Disp: , Rfl:  ?  polyethylene glycol powder (GLYCOLAX/MIRALAX) 17 GM/SCOOP powder, Take 17 g by mouth daily. Drink 17g (1 scoop) dissolved in water per day., Disp: 255  g, Rfl: 0  ? ?Objective ?There were no vitals filed for this visit. ? ? ?Gen: NAD ?CV: S1 S2 RRR ?Lungs: Clear to auscultation bilaterally ?Abd: soft, nontender, midline scar, absent umbilicus ? ? ?Previous Pelvic Exam showed: ?POP-Q ?  ?-1  ?                                          Aa   ?-1 ?                                          Ba   ?-  6  ?                                            C  ?  ?4  ?                                          Gh   ?4  ?                                          Pb   ?8  ?                                          tvl  ?  ?-3  ?                                          Ap   ?-3  ?                                          Bp   ?-7  ?                                            D  ?  ? ? ? ?Assessment/ Plan ? ?Assessment: ?The patient is a 71 y.o. year old with stage II POP scheduled to undergo Exam under anesthesia, total laparoscopic hysterectomy, bilateral salpingo-oophorectomy, sacrocolpopexy, cystoscopy, possible sacrospinous ligament fixation and anterior repair if robotic surgery not possible.  ? ? ? ? ?[name], MD ? ? ? ? ?

## 2022-02-15 ENCOUNTER — Other Ambulatory Visit: Payer: Self-pay

## 2022-02-15 ENCOUNTER — Encounter (HOSPITAL_BASED_OUTPATIENT_CLINIC_OR_DEPARTMENT_OTHER): Payer: Self-pay | Admitting: Obstetrics and Gynecology

## 2022-02-15 NOTE — Progress Notes (Signed)
? Your procedure is scheduled on Friday, 03/02/22. ? Report to [place] ? Call this number if you have problems the morning of surgery  :[phone]. ? ? [facility] [place].  WE ARE LOCATED IN THE [facility]. ? ?PLEASE BRING YOUR INSURANCE CARD AND PHOTO ID DAY OF SURGERY. ? ?ONLY 2 PEOPLE ARE ALLOWED IN  WAITING  ROOM.  ?                                   ? REMEMBER: ? DO NOT EAT FOOD, CANDY GUM OR MINTS  AFTER MIDNIGHT THE NIGHT BEFORE YOUR SURGERY . YOU MAY HAVE CLEAR LIQUIDS FROM MIDNIGHT THE NIGHT BEFORE YOUR SURGERY UNTIL  4:30 am. NO CLEAR LIQUIDS AFTER 4:30 am DAY OF SURGERY. ? ?YOU MAY  BRUSH YOUR TEETH MORNING OF SURGERY AND RINSE YOUR MOUTH OUT, NO CHEWING GUM CANDY OR MINTS. ? ? ? ? ?CLEAR LIQUID DIET ? ? ?Foods Allowed                                                                     Foods Excluded ? ?Coffee and tea, regular and decaf                             liquids that you cannot  ?Plain Jell-O any favor except red or purple                                           see through such as: ?Fruit ices (not with fruit pulp)                                     milk, soups, orange juice  ?Iced Popsicles                                    All solid food ?Carbonated beverages, regular and diet                                    ?Cranberry, grape and apple juices ?Sports drinks like Gatorade ? ?Sample Menu ?Breakfast                                Lunch                                     Supper ?Cranberry juice                                           ?  Jell-O                                     Grape juice                           Apple juice ?Coffee or tea                        Jell-O                                      Popsicle ?                                               Coffee or tea                        Coffee or tea ? ?_____________________________________________________________________ ?  ? ? TAKE THESE MEDICATIONS  MORNING OF SURGERY WITH A SIP OF WATER:   ?Lipitor (Atorvastatin), Clonazepam if needed ? ?PLEASE BRING YOUR CPAP IF IT HAS ARRIVED BY YOUR SURGERY DATE. ? ?TWO VISITORS ARE  ALLOWED IN WAITING ROOM ONLY DAY OF SURGERY.   ? ? UP TO 4 VISITORS  MAY VISIT IN THE EXTENDED RECOVERY ROOM UNTIL 800 PM ONLY.  1 VISITOR AGE 71 AND OVER MAY SPEND THE NIGHT AND MUST BE IN EXTENDED RECOVERY ROOM NO LATER THAN 800 PM . YOUR DISCHARGE TIME AFTER YOU SPEND THE NIGHT IS 900 AM THE MORNING AFTER YOUR SURGERY. ? ?YOU MAY PACK A SMALL OVERNIGHT BAG WITH TOILETRIES FOR YOUR OVERNIGHT STAY IF YOU WISH. ? ?YOUR PRESCRIPTION MEDICATIONS WILL BE PROVIDED DURING [place]. ? ?                                   ?DO NOT WEAR JEWERLY, MAKE UP. ?DO NOT WEAR LOTIONS, POWDERS, PERFUMES OR NAIL POLISH ON YOUR FINGERNAILS. TOENAIL POLISH IS OK TO WEAR. ?DO NOT SHAVE FOR 48 HOURS PRIOR TO DAY OF SURGERY. ?MEN MAY SHAVE FACE AND NECK. ?CONTACTS, GLASSES, OR DENTURES MAY NOT BE WORN TO SURGERY. ? ?REMEMBER: NO SMOKING, DRUGS OR ALCOHOL FOR 24 HOURS BEFORE YOUR SURGERY. ?                                   ?[facility] IS NOT RESPONSIBLE  FOR ANY BELONGINGS.                                  ?                                  . ?          [facility] - Preparing for Surgery ?Before surgery, you can play an important role.  Because skin is not sterile, your skin needs to be as free of germs as possible.  You can reduce the number of germs on your skin by washing with CHG (chlorahexidine gluconate) soap before surgery.  CHG is an antiseptic cleaner which kills germs and bonds with the skin to continue killing germs even after washing. ?Please DO NOT use if you have an allergy to CHG or antibacterial soaps.  If your skin becomes reddened/irritated stop using the CHG and inform your nurse when you arrive at Short Stay. ?Do not shave (including legs and underarms) for at least 48 hours prior to the first CHG shower.  You may shave your  face/neck. ?Please follow these instructions carefully: ? 1.  Shower with CHG Soap the night before surgery and the  morning of Surgery. ? 2.  If you choose to wash your hair, wash your hair first as usual with your  normal  shampoo. ? 3.  After you shampoo, rinse your hair and body thoroughly to remove the  shampoo.                            ?4.  Use CHG as you would any other liquid soap.  You can apply chg directly  to the skin and wash  ?                    Gently with a scrungie or clean washcloth. ? 5.  Apply the CHG Soap to your body ONLY FROM THE NECK DOWN.   Do not use on face/ open      ?                     Wound or open sores. Avoid contact with eyes, ears mouth and genitals (private parts).  ?                     Production manager,  Genitals (private parts) with your normal soap. ?            6.  Wash thoroughly, paying special attention to the area where your surgery  will be performed. ? 7.  Thoroughly rinse your body with warm water from the neck down. ? 8.  DO NOT shower/wash with your normal soap after using and rinsing off  the CHG Soap. ?               9.  Pat yourself dry with a clean towel. ?           10.  Wear clean pajamas. ?           11.  Place clean sheets on your bed the night of your first shower and do not  sleep with pets. ?Day of Surgery : ?Do not apply any lotions/deodorants the morning of surgery.  Please wear clean clothes to the hospital/surgery center. ? ?IF YOU HAVE ANY SKIN IRRITATION OR PROBLEMS WITH THE SURGICAL SOAP, PLEASE GET A BAR OF GOLD DIAL SOAP AND SHOWER THE NIGHT BEFORE YOUR SURGERY AND THE MORNING OF YOUR SURGERY. PLEASE LET THE NURSE KNOW MORNING OF YOUR SURGERY IF YOU HAD ANY PROBLEMS WITH THE SURGICAL SOAP. ? ? ?________________________________________________________________________                  ?                                    ?  QUESTIONS  [facility] PRE OP NURSE PHONE [phone].                                    ?

## 2022-02-15 NOTE — Progress Notes (Signed)
Spoke w/ via phone for pre-op interview---Ahsley ?Lab needs dos---- none              ?Lab results------02/27/22 lab appt for CBC, BMP, type & screen, 09/09/21 EKG in chart & Epic, 10/06/21 Echo in [facility] ?COVID test -----patient states asymptomatic no test needed ?Arrive at -------0530 on Friday, 03/02/22 ?NPO after MN NO Solid Food.  Clear liquids from MN until---0430 ?Med rec completed ?Medications to take morning of surgery -----Atorvastatin, Clonazepam prn ?Diabetic medication -----Hold Metformin morning of surgery ?Patient instructed no nail polish to be worn day of surgery ?Patient instructed to bring photo id and insurance card day of surgery ?Patient aware to have Driver (ride ) / caregiver    for 24 hours after surgery - daughter, Suanne Marker ?Patient Special Instructions -----Bring CPAP (if pt has rec'd CPAP at the time of surgery.) Extended recovery instructions given. ?Pre-Op special Istructions -----none ?Patient verbalized understanding of instructions that were given at this phone interview. ?Patient denies shortness of breath, chest pain, fever, cough at this phone interview.  ? ?Patient had a rectocele repair at [facility] on 10/23/2021. Patient states no changes in medical history since 10/23/21. She follows with cardiologist, Skeet Latch, MD. Her last OV was 12/01/21 in Epic. 09/09/21 EKG in Epic & chart. 10/06/21 Echo in Epic, LVEF 60 - 65%. ?

## 2022-02-27 ENCOUNTER — Encounter (HOSPITAL_COMMUNITY)
Admission: RE | Admit: 2022-02-27 | Discharge: 2022-02-27 | Disposition: A | Discharge status: Home / Self Care | Payer: Medicare Other | Source: Ambulatory Visit | Attending: Obstetrics and Gynecology | Admitting: Obstetrics and Gynecology

## 2022-02-27 DIAGNOSIS — Z01812 Encounter for preprocedural laboratory examination: Secondary | ICD-10-CM | POA: Diagnosis present

## 2022-02-27 DIAGNOSIS — Z01818 Encounter for other preprocedural examination: Secondary | ICD-10-CM

## 2022-02-27 LAB — CBC
HCT: 32 % — ABNORMAL LOW (ref 36.0–46.0)
Hemoglobin: 10.2 g/dL — ABNORMAL LOW (ref 12.0–15.0)
MCH: 29.1 pg (ref 26.0–34.0)
MCHC: 31.9 g/dL (ref 30.0–36.0)
MCV: 91.2 fL (ref 80.0–100.0)
Platelets: 276 10*3/uL (ref 150–400)
RBC: 3.51 MIL/uL — ABNORMAL LOW (ref 3.87–5.11)
RDW: 13.2 % (ref 11.5–15.5)
WBC: 6.4 10*3/uL (ref 4.0–10.5)
nRBC: 0 % (ref 0.0–0.2)

## 2022-02-27 LAB — BASIC METABOLIC PANEL
Anion gap: 7 (ref 5–15)
BUN: 17 mg/dL (ref 8–23)
CO2: 24 mmol/L (ref 22–32)
Calcium: 9.7 mg/dL (ref 8.9–10.3)
Chloride: 111 mmol/L (ref 98–111)
Creatinine, Ser: 1.18 mg/dL — ABNORMAL HIGH (ref 0.44–1.00)
GFR, Estimated: 50 mL/min — ABNORMAL LOW (ref >60)
Glucose, Bld: 120 mg/dL — ABNORMAL HIGH (ref 70–99)
Potassium: 4.7 mmol/L (ref 3.5–5.1)
Sodium: 142 mmol/L (ref 135–145)

## 2022-03-01 ENCOUNTER — Encounter (HOSPITAL_BASED_OUTPATIENT_CLINIC_OR_DEPARTMENT_OTHER): Payer: Self-pay | Admitting: Obstetrics and Gynecology

## 2022-03-01 NOTE — Anesthesia Preprocedure Evaluation (Addendum)
Anesthesia Evaluation  ?Patient identified by MRN, date of birth, ID band ?Patient awake ? ? ? ?Reviewed: ?Allergy & Precautions, H&P , NPO status , Patient's Chart, lab work & pertinent test results ? ?Airway ?Mallampati: I ? ?TM Distance: >3 FB ?Neck ROM: Full ? ? ? Dental ? ?(+) Edentulous Upper, Edentulous Lower ?  ?Pulmonary ?neg pulmonary ROS, sleep apnea and Continuous Positive Airway Pressure Ventilation ,  ?  ?Pulmonary exam normal ?breath sounds clear to auscultation ? ? ? ? ? ? Cardiovascular ?Exercise Tolerance: Good ?hypertension, Pt. on medications ?Normal cardiovascular exam+ dysrhythmias  ?Rhythm:Regular Rate:Normal ? ? 02/ 2019 nulcear stresst test, normal perfusion no ischemia, nuclear ef 55%;   ? last echo 11/ 2022 ef 60-65%, G1DD, trivial MR;   ? ?  ?Neuro/Psych ?PSYCHIATRIC DISORDERS Anxiety 02/ 2019  carotid US, bilateral ICA 40-59% stenosis; ?negative neurological ROS ?   ? GI/Hepatic ?Neg liver ROS, hiatal hernia, GERD  Medicated and Controlled,  ?Endo/Other  ?diabetes, Well Controlled, Type 2, Oral Hypoglycemic AgentsHyperlipidemia ? ? Renal/GU ?negative Renal ROS  ?negative genitourinary ?  ?Musculoskeletal ? ?(+) Arthritis , Osteoarthritis,   ? Abdominal ?  ?Peds ?negative pediatric ROS ?(+)  Hematology ? ?(+) Blood dyscrasia, anemia ,   ?Anesthesia Other Findings ? ? Reproductive/Obstetrics ?Anterior uterovaginal prolapse ? ?  ? ? ? ? ? ? ? ? ? ? ? ? ? ?  ?  ? ? ? ? ? ? ? ?Anesthesia Physical ? ?Anesthesia Plan ? ?ASA: 3 ? ?Anesthesia Plan: General  ? ?Post-op Pain Management: Tylenol PO (pre-op)*, Precedex, Ketamine IV* and Dilaudid IV  ? ?Induction: Intravenous ? ?PONV Risk Score and Plan: 3 and Ondansetron, Dexamethasone and Treatment may vary due to age or medical condition ? ?Airway Management Planned: Oral ETT ? ?Additional Equipment: None ? ?Intra-op Plan:  ? ?Post-operative Plan: Extubation in OR ? ?Informed Consent: I have reviewed the patients  History and Physical, chart, labs and discussed the procedure including the risks, benefits and alternatives for the proposed anesthesia with the patient or authorized representative who has indicated his/her understanding and acceptance.  ? ? ? ?Dental advisory given ? ?Plan Discussed with: CRNA and Anesthesiologist ? ?Anesthesia Plan Comments: ( ? ?)  ? ? ? ? ? ?Anesthesia Quick Evaluation ? ?

## 2022-03-02 ENCOUNTER — Encounter (HOSPITAL_BASED_OUTPATIENT_CLINIC_OR_DEPARTMENT_OTHER): Payer: Self-pay | Admitting: Obstetrics and Gynecology

## 2022-03-02 ENCOUNTER — Encounter (HOSPITAL_BASED_OUTPATIENT_CLINIC_OR_DEPARTMENT_OTHER)
Admission: RE | Disposition: A | Discharge status: Home / Self Care | Payer: Self-pay | Source: Ambulatory Visit | Attending: Obstetrics and Gynecology

## 2022-03-02 ENCOUNTER — Ambulatory Visit (HOSPITAL_BASED_OUTPATIENT_CLINIC_OR_DEPARTMENT_OTHER)
Admission: RE | Admit: 2022-03-02 | Discharge: 2022-03-03 | Disposition: A | Discharge status: Home / Self Care | Payer: Medicare Other | Source: Ambulatory Visit | Attending: Obstetrics and Gynecology | Admitting: Obstetrics and Gynecology

## 2022-03-02 ENCOUNTER — Other Ambulatory Visit: Payer: Self-pay

## 2022-03-02 ENCOUNTER — Ambulatory Visit (HOSPITAL_BASED_OUTPATIENT_CLINIC_OR_DEPARTMENT_OTHER): Payer: Medicare Other | Admitting: Anesthesiology

## 2022-03-02 DIAGNOSIS — Z7984 Long term (current) use of oral hypoglycemic drugs: Secondary | ICD-10-CM

## 2022-03-02 DIAGNOSIS — K565 Intestinal adhesions [bands], unspecified as to partial versus complete obstruction: Secondary | ICD-10-CM | POA: Diagnosis not present

## 2022-03-02 DIAGNOSIS — Z79899 Other long term (current) drug therapy: Secondary | ICD-10-CM | POA: Insufficient documentation

## 2022-03-02 DIAGNOSIS — N812 Incomplete uterovaginal prolapse: Secondary | ICD-10-CM | POA: Diagnosis present

## 2022-03-02 DIAGNOSIS — G4733 Obstructive sleep apnea (adult) (pediatric): Secondary | ICD-10-CM | POA: Diagnosis not present

## 2022-03-02 DIAGNOSIS — K219 Gastro-esophageal reflux disease without esophagitis: Secondary | ICD-10-CM | POA: Insufficient documentation

## 2022-03-02 DIAGNOSIS — Z8616 Personal history of COVID-19: Secondary | ICD-10-CM | POA: Diagnosis not present

## 2022-03-02 DIAGNOSIS — Z01818 Encounter for other preprocedural examination: Secondary | ICD-10-CM

## 2022-03-02 DIAGNOSIS — M1990 Unspecified osteoarthritis, unspecified site: Secondary | ICD-10-CM | POA: Insufficient documentation

## 2022-03-02 DIAGNOSIS — N8111 Cystocele, midline: Secondary | ICD-10-CM

## 2022-03-02 DIAGNOSIS — F419 Anxiety disorder, unspecified: Secondary | ICD-10-CM | POA: Insufficient documentation

## 2022-03-02 DIAGNOSIS — I10 Essential (primary) hypertension: Secondary | ICD-10-CM | POA: Diagnosis not present

## 2022-03-02 DIAGNOSIS — E119 Type 2 diabetes mellitus without complications: Secondary | ICD-10-CM | POA: Diagnosis not present

## 2022-03-02 DIAGNOSIS — K660 Peritoneal adhesions (postprocedural) (postinfection): Secondary | ICD-10-CM

## 2022-03-02 HISTORY — PX: ROBOTIC ASSISTED LAPAROSCOPIC LYSIS OF ADHESION: SHX6080

## 2022-03-02 HISTORY — PX: ANTERIOR AND POSTERIOR REPAIR WITH SACROSPINOUS FIXATION: SHX6536

## 2022-03-02 HISTORY — PX: CYSTOSCOPY: SHX5120

## 2022-03-02 LAB — GLUCOSE, CAPILLARY
Glucose-Capillary: 104 mg/dL — ABNORMAL HIGH (ref 70–99)
Glucose-Capillary: 182 mg/dL — ABNORMAL HIGH (ref 70–99)
Glucose-Capillary: 198 mg/dL — ABNORMAL HIGH (ref 70–99)
Glucose-Capillary: 203 mg/dL — ABNORMAL HIGH (ref 70–99)
Glucose-Capillary: 225 mg/dL — ABNORMAL HIGH (ref 70–99)

## 2022-03-02 LAB — TYPE AND SCREEN
ABO/RH(D): A POS
Antibody Screen: NEGATIVE

## 2022-03-02 LAB — ABO/RH: ABO/RH(D): A POS

## 2022-03-02 SURGERY — LYSIS, ADHESIONS, ROBOT-ASSISTED, LAPAROSCOPIC
Anesthesia: General | Site: Vagina

## 2022-03-02 MED ORDER — FENTANYL CITRATE (PF) 250 MCG/5ML IJ SOLN
INTRAMUSCULAR | Status: AC
  Filled 2022-03-02: qty 5

## 2022-03-02 MED ORDER — WATER FOR IRRIGATION, STERILE IR SOLN
Status: DC | PRN
  Administered 2022-03-02: 500 mL

## 2022-03-02 MED ORDER — ONDANSETRON HCL 4 MG/2ML IJ SOLN
INTRAMUSCULAR | Status: AC
  Filled 2022-03-02: qty 2

## 2022-03-02 MED ORDER — ONDANSETRON HCL 4 MG/2ML IJ SOLN: 4.0000 mg | Freq: Once | INTRAMUSCULAR | Status: DC | PRN

## 2022-03-02 MED ORDER — SODIUM CHLORIDE 0.9 % IR SOLN
Status: DC | PRN
  Administered 2022-03-02: 1000 mL

## 2022-03-02 MED ORDER — KETOROLAC TROMETHAMINE 30 MG/ML IJ SOLN
INTRAMUSCULAR | Status: DC | PRN
  Administered 2022-03-02: 30 mg via INTRAVENOUS

## 2022-03-02 MED ORDER — CEFAZOLIN SODIUM-DEXTROSE 2-4 GM/100ML-% IV SOLN
2.0000 g | INTRAVENOUS | Status: AC
  Administered 2022-03-02: 2 g via INTRAVENOUS

## 2022-03-02 MED ORDER — OXYCODONE HCL 5 MG PO TABS: 5.0000 mg | ORAL_TABLET | Freq: Once | ORAL | Status: DC | PRN

## 2022-03-02 MED ORDER — SUGAMMADEX SODIUM 200 MG/2ML IV SOLN
INTRAVENOUS | Status: DC | PRN
  Administered 2022-03-02: 200 mg via INTRAVENOUS

## 2022-03-02 MED ORDER — PROPOFOL 10 MG/ML IV BOLUS
INTRAVENOUS | Status: AC
Start: 2022-03-02 — End: ?
  Filled 2022-03-02: qty 20

## 2022-03-02 MED ORDER — PROPOFOL 10 MG/ML IV BOLUS
INTRAVENOUS | Status: DC | PRN
  Administered 2022-03-02: 30 mg via INTRAVENOUS
  Administered 2022-03-02: 120 mg via INTRAVENOUS

## 2022-03-02 MED ORDER — ACETAMINOPHEN 325 MG PO TABS: 650.0000 mg | ORAL_TABLET | ORAL | Status: DC | PRN

## 2022-03-02 MED ORDER — GABAPENTIN 300 MG PO CAPS
ORAL_CAPSULE | ORAL | Status: AC
  Filled 2022-03-02: qty 1

## 2022-03-02 MED ORDER — KETOROLAC TROMETHAMINE 30 MG/ML IJ SOLN
INTRAMUSCULAR | Status: AC
  Filled 2022-03-02: qty 1

## 2022-03-02 MED ORDER — LISINOPRIL 5 MG PO TABS
5.0000 mg | ORAL_TABLET | Freq: Every day | ORAL | Status: DC
  Administered 2022-03-02: 5 mg via ORAL
  Filled 2022-03-02: qty 1

## 2022-03-02 MED ORDER — EPHEDRINE 5 MG/ML INJ
INTRAVENOUS | Status: AC
  Filled 2022-03-02: qty 5

## 2022-03-02 MED ORDER — LACTATED RINGERS IV SOLN: INTRAVENOUS | Status: DC

## 2022-03-02 MED ORDER — FENTANYL CITRATE (PF) 100 MCG/2ML IJ SOLN
INTRAMUSCULAR | Status: DC | PRN
  Administered 2022-03-02 (×4): 50 ug via INTRAVENOUS
  Administered 2022-03-02: 150 ug via INTRAVENOUS

## 2022-03-02 MED ORDER — ONDANSETRON HCL 4 MG/2ML IJ SOLN
INTRAMUSCULAR | Status: DC | PRN
  Administered 2022-03-02: 4 mg via INTRAVENOUS

## 2022-03-02 MED ORDER — ACETAMINOPHEN 500 MG PO TABS
1000.0000 mg | ORAL_TABLET | ORAL | Status: AC
  Administered 2022-03-02: 1000 mg via ORAL

## 2022-03-02 MED ORDER — PHENAZOPYRIDINE HCL 100 MG PO TABS
ORAL_TABLET | ORAL | Status: AC
  Filled 2022-03-02: qty 1

## 2022-03-02 MED ORDER — EPHEDRINE SULFATE (PRESSORS) 50 MG/ML IJ SOLN
INTRAMUSCULAR | Status: DC | PRN
  Administered 2022-03-02: 5 mg via INTRAVENOUS

## 2022-03-02 MED ORDER — METFORMIN HCL 500 MG PO TABS
500.0000 mg | ORAL_TABLET | Freq: Two times a day (BID) | ORAL | Status: DC
  Administered 2022-03-02 – 2022-03-03 (×2): 500 mg via ORAL
  Filled 2022-03-02 (×2): qty 1

## 2022-03-02 MED ORDER — DEXAMETHASONE SODIUM PHOSPHATE 4 MG/ML IJ SOLN
INTRAMUSCULAR | Status: DC | PRN
  Administered 2022-03-02: 5 mg via INTRAVENOUS

## 2022-03-02 MED ORDER — INSULIN ASPART 100 UNIT/ML IJ SOLN
INTRAMUSCULAR | Status: AC
  Filled 2022-03-02: qty 1

## 2022-03-02 MED ORDER — SIMETHICONE 80 MG PO CHEW: 80.0000 mg | CHEWABLE_TABLET | Freq: Four times a day (QID) | ORAL | Status: DC | PRN

## 2022-03-02 MED ORDER — MIDAZOLAM HCL 5 MG/5ML IJ SOLN
INTRAMUSCULAR | Status: DC | PRN
  Administered 2022-03-02 (×2): 1 mg via INTRAVENOUS

## 2022-03-02 MED ORDER — LIDOCAINE HCL (PF) 2 % IJ SOLN
INTRAMUSCULAR | Status: AC
  Filled 2022-03-02: qty 5

## 2022-03-02 MED ORDER — ROCURONIUM BROMIDE 100 MG/10ML IV SOLN
INTRAVENOUS | Status: DC | PRN
  Administered 2022-03-02 (×2): 10 mg via INTRAVENOUS
  Administered 2022-03-02: 70 mg via INTRAVENOUS

## 2022-03-02 MED ORDER — CEFAZOLIN SODIUM-DEXTROSE 2-4 GM/100ML-% IV SOLN
INTRAVENOUS | Status: AC
  Filled 2022-03-02: qty 100

## 2022-03-02 MED ORDER — ATORVASTATIN CALCIUM 20 MG PO TABS
20.0000 mg | ORAL_TABLET | Freq: Every day | ORAL | Status: DC
  Administered 2022-03-02: 20 mg via ORAL
  Filled 2022-03-02: qty 1

## 2022-03-02 MED ORDER — HYDROMORPHONE HCL 1 MG/ML IJ SOLN: 0.2500 mg | INTRAMUSCULAR | Status: DC | PRN

## 2022-03-02 MED ORDER — PHENAZOPYRIDINE HCL 100 MG PO TABS
200.0000 mg | ORAL_TABLET | ORAL | Status: AC
  Administered 2022-03-02: 200 mg via ORAL

## 2022-03-02 MED ORDER — PHENAZOPYRIDINE HCL 100 MG PO TABS
ORAL_TABLET | ORAL | Status: AC
  Filled 2022-03-02: qty 2

## 2022-03-02 MED ORDER — ACETAMINOPHEN 500 MG PO TABS
ORAL_TABLET | ORAL | Status: AC
  Filled 2022-03-02: qty 2

## 2022-03-02 MED ORDER — ONDANSETRON HCL 4 MG/2ML IJ SOLN
4.0000 mg | Freq: Four times a day (QID) | INTRAMUSCULAR | Status: DC | PRN
  Administered 2022-03-02: 4 mg via INTRAVENOUS

## 2022-03-02 MED ORDER — ONDANSETRON HCL 4 MG PO TABS: 4.0000 mg | ORAL_TABLET | Freq: Four times a day (QID) | ORAL | Status: DC | PRN

## 2022-03-02 MED ORDER — ROCURONIUM BROMIDE 10 MG/ML (PF) SYRINGE
PREFILLED_SYRINGE | INTRAVENOUS | Status: AC
  Filled 2022-03-02: qty 10

## 2022-03-02 MED ORDER — LIDOCAINE-EPINEPHRINE 1 %-1:100000 IJ SOLN
INTRAMUSCULAR | Status: DC | PRN
  Administered 2022-03-02: 17 mL

## 2022-03-02 MED ORDER — OXYCODONE HCL 5 MG PO TABS
5.0000 mg | ORAL_TABLET | ORAL | Status: DC | PRN
  Administered 2022-03-03: 5 mg via ORAL

## 2022-03-02 MED ORDER — KETOROLAC TROMETHAMINE 30 MG/ML IJ SOLN
30.0000 mg | Freq: Once | INTRAMUSCULAR | Status: AC
  Administered 2022-03-02: 30 mg via INTRAVENOUS

## 2022-03-02 MED ORDER — HEMOSTATIC AGENTS (NO CHARGE) OPTIME
TOPICAL | Status: DC | PRN
  Administered 2022-03-02: 1

## 2022-03-02 MED ORDER — INSULIN ASPART 100 UNIT/ML IJ SOLN
0.0000 [IU] | Freq: Three times a day (TID) | INTRAMUSCULAR | Status: DC
  Administered 2022-03-02: 2 [IU] via SUBCUTANEOUS
  Administered 2022-03-02 (×2): 3 [IU] via SUBCUTANEOUS

## 2022-03-02 MED ORDER — MIDAZOLAM HCL 2 MG/2ML IJ SOLN
INTRAMUSCULAR | Status: AC
Start: 2022-03-02 — End: ?
  Filled 2022-03-02: qty 2

## 2022-03-02 MED ORDER — POLYETHYLENE GLYCOL 3350 17 G PO PACK
17.0000 g | PACK | Freq: Every day | ORAL | Status: DC
  Administered 2022-03-02: 17 g via ORAL

## 2022-03-02 MED ORDER — POVIDONE-IODINE 10 % EX SWAB: 2.0000 "application " | Freq: Once | CUTANEOUS | Status: DC

## 2022-03-02 MED ORDER — LIDOCAINE HCL (CARDIAC) PF 100 MG/5ML IV SOSY
PREFILLED_SYRINGE | INTRAVENOUS | Status: DC | PRN
  Administered 2022-03-02: 60 mg via INTRAVENOUS

## 2022-03-02 MED ORDER — GABAPENTIN 300 MG PO CAPS
300.0000 mg | ORAL_CAPSULE | ORAL | Status: AC
  Administered 2022-03-02: 300 mg via ORAL

## 2022-03-02 MED ORDER — BUPIVACAINE HCL (PF) 0.25 % IJ SOLN
INTRAMUSCULAR | Status: DC | PRN
  Administered 2022-03-02: 15 mL

## 2022-03-02 MED ORDER — FENTANYL CITRATE (PF) 100 MCG/2ML IJ SOLN
INTRAMUSCULAR | Status: AC
Start: 2022-03-02 — End: ?
  Filled 2022-03-02: qty 2

## 2022-03-02 MED ORDER — OXYCODONE HCL 5 MG/5ML PO SOLN: 5.0000 mg | Freq: Once | ORAL | Status: DC | PRN

## 2022-03-02 MED ORDER — POLYETHYLENE GLYCOL 3350 17 G PO PACK
PACK | ORAL | Status: AC
  Filled 2022-03-02: qty 1

## 2022-03-02 SURGICAL SUPPLY — 82 items
ADH SKN CLS APL DERMABOND .7 (GAUZE/BANDAGES/DRESSINGS) ×4
APL PRP STRL LF DISP 70% ISPRP (MISCELLANEOUS) ×4
APL SRG 38 LTWT LNG FL B (MISCELLANEOUS) ×4
APPLICATOR ARISTA FLEXITIP XL (MISCELLANEOUS) ×2 IMPLANT
BLADE SURG 15 STRL LF DISP TIS (BLADE) ×1 IMPLANT
BLADE SURG 15 STRL SS (BLADE) ×5
CATH FOLEY 3WAY  5CC 16FR (CATHETERS) ×5
CATH FOLEY 3WAY 5CC 16FR (CATHETERS) ×4 IMPLANT
CHLORAPREP W/TINT 26 (MISCELLANEOUS) ×5 IMPLANT
CNTNR URN SCR LID CUP LEK RST (MISCELLANEOUS) ×1 IMPLANT
CONT SPEC 4OZ STRL OR WHT (MISCELLANEOUS) ×5
COVER BACK TABLE 60X90IN (DRAPES) ×5 IMPLANT
COVER TIP SHEARS 8 DVNC (MISCELLANEOUS) ×4 IMPLANT
COVER TIP SHEARS 8MM DA VINCI (MISCELLANEOUS) ×5
DECANTER SPIKE VIAL GLASS SM (MISCELLANEOUS) ×8 IMPLANT
DEFOGGER SCOPE WARMER CLEARIFY (MISCELLANEOUS) ×5 IMPLANT
DERMABOND ADVANCED (GAUZE/BANDAGES/DRESSINGS) ×1
DERMABOND ADVANCED .7 DNX12 (GAUZE/BANDAGES/DRESSINGS) ×4 IMPLANT
DEVICE CAPIO SLIM SINGLE (INSTRUMENTS) ×2 IMPLANT
DRAPE ARM DVNC X/XI (DISPOSABLE) ×16 IMPLANT
DRAPE COLUMN DVNC XI (DISPOSABLE) ×4 IMPLANT
DRAPE DA VINCI XI ARM (DISPOSABLE) ×20
DRAPE DA VINCI XI COLUMN (DISPOSABLE) ×5
DRAPE SHEET LG 3/4 BI-LAMINATE (DRAPES) ×5 IMPLANT
DRAPE UTILITY XL STRL (DRAPES) ×5 IMPLANT
ELECT REM PT RETURN 9FT ADLT (ELECTROSURGICAL) ×5
ELECTRODE REM PT RTRN 9FT ADLT (ELECTROSURGICAL) ×4 IMPLANT
GAUZE 4X4 16PLY ~~LOC~~+RFID DBL (SPONGE) ×12 IMPLANT
GLOVE BIO SURGEON STRL SZ 6 (GLOVE) ×15 IMPLANT
GLOVE BIOGEL PI IND STRL 6.5 (GLOVE) ×12 IMPLANT
GLOVE BIOGEL PI IND STRL 7.0 (GLOVE) ×14 IMPLANT
GLOVE BIOGEL PI INDICATOR 6.5 (GLOVE) ×3
GLOVE BIOGEL PI INDICATOR 7.0 (GLOVE) ×8
GLOVE SURG ENC MOIS LTX SZ6 (GLOVE) ×5 IMPLANT
GLOVE SURG POLYISO LF SZ7 (GLOVE) ×2 IMPLANT
GLOVE SURG UNDER POLY LF SZ6.5 (GLOVE) ×11 IMPLANT
HEMOSTAT ARISTA ABSORB 3G PWDR (HEMOSTASIS) ×2 IMPLANT
HIBICLENS CHG 4% 4OZ BTL (MISCELLANEOUS) ×5 IMPLANT
HOLDER FOLEY CATH W/STRAP (MISCELLANEOUS) ×5 IMPLANT
IRRIG SUCT STRYKERFLOW 2 WTIP (MISCELLANEOUS) ×5
IRRIGATION SUCT STRKRFLW 2 WTP (MISCELLANEOUS) ×4 IMPLANT
IV NS 1000ML (IV SOLUTION) ×5
IV NS 1000ML BAXH (IV SOLUTION) ×1 IMPLANT
KIT TURNOVER CYSTO (KITS) ×5 IMPLANT
LEGGING LITHOTOMY PAIR STRL (DRAPES) ×5 IMPLANT
MANIFOLD NEPTUNE II (INSTRUMENTS) ×5 IMPLANT
MANIPULATOR ADVINCU DEL 3.0 PL (MISCELLANEOUS) ×2 IMPLANT
NDL MAYO 6 CRC TAPER PT (NEEDLE) IMPLANT
NEEDLE HYPO 22GX1.5 SAFETY (NEEDLE) ×2 IMPLANT
NEEDLE INSUFFLATION 120MM (ENDOMECHANICALS) ×5 IMPLANT
NEEDLE MAYO 6 CRC TAPER PT (NEEDLE) ×5 IMPLANT
OBTURATOR OPTICAL STANDARD 8MM (TROCAR) ×5
OBTURATOR OPTICAL STND 8 DVNC (TROCAR) ×4
OBTURATOR OPTICALSTD 8 DVNC (TROCAR) ×4 IMPLANT
PACK CYSTO (CUSTOM PROCEDURE TRAY) ×5 IMPLANT
PACK PERINEAL COLD (PAD) ×2 IMPLANT
PACK ROBOT WH (CUSTOM PROCEDURE TRAY) ×5 IMPLANT
PACK ROBOTIC GOWN (GOWN DISPOSABLE) ×5 IMPLANT
PAD OB MATERNITY 4.3X12.25 (PERSONAL CARE ITEMS) ×5 IMPLANT
PAD POSITIONING PINK XL (MISCELLANEOUS) ×5 IMPLANT
PAD PREP 24X48 CUFFED NSTRL (MISCELLANEOUS) ×5 IMPLANT
PROTECTOR NERVE ULNAR (MISCELLANEOUS) ×5 IMPLANT
RETRACTOR LONE STAR DISPOSABLE (INSTRUMENTS) ×2 IMPLANT
RETRACTOR STAY HOOK 5MM (MISCELLANEOUS) ×2 IMPLANT
SCISSORS LAP 5X35 DISP (ENDOMECHANICALS) ×2 IMPLANT
SEAL CANN UNIV 5-8 DVNC XI (MISCELLANEOUS) ×20 IMPLANT
SEAL XI 5MM-8MM UNIVERSAL (MISCELLANEOUS) ×25
SET IRRIG Y TYPE TUR BLADDER L (SET/KITS/TRAYS/PACK) ×5 IMPLANT
SET TUBE SMOKE EVAC HIGH FLOW (TUBING) ×5 IMPLANT
SUT ABS MONO DBL WITH NDL 48IN (SUTURE) ×4 IMPLANT
SUT GORETEX NAB #0 THX26 36IN (SUTURE) ×5 IMPLANT
SUT MNCRL AB 4-0 PS2 18 (SUTURE) ×7 IMPLANT
SUT MON AB 2-0 SH 27 (SUTURE) ×5 IMPLANT
SUT VIC AB 2-0 SH 27 (SUTURE) ×10
SUT VIC AB 2-0 SH 27XBRD (SUTURE) ×2 IMPLANT
SUT VICRYL 2-0 SH 8X27 (SUTURE) ×2 IMPLANT
SUT VLOC 180 2-0 9IN GS21 (SUTURE) ×12 IMPLANT
SYR CONTROL 10ML LL (SYRINGE) ×2 IMPLANT
TOWEL OR 17X26 10 PK STRL BLUE (TOWEL DISPOSABLE) ×5 IMPLANT
TUBE CONNECTING 12X1/4 (SUCTIONS) ×2 IMPLANT
WATER STERILE IRR 500ML POUR (IV SOLUTION) ×2 IMPLANT
YANKAUER SUCT BULB TIP NO VENT (SUCTIONS) ×2 IMPLANT

## 2022-03-02 NOTE — Interval H&P Note (Signed)
History and Physical Interval Note: ? ?03/02/2022 ?7:15 AM ? ?Michelle Robertson  has presented today for surgery, with the diagnosis of anterior vaginal prolapse; uterovaginal prolapse incomplete.  The various methods of treatment have been discussed with the patient and family. After consideration of risks, benefits and other options for treatment, the patient has consented to  Procedure(s) with comments: ?XI ROBOTIC ASSISTED TOTAL HYSTERECTOMY WITH BILATERAL SALPINGO-OOPHORECTOMY AND SACROCOLPOPEXY (N/A)  ?CYSTOSCOPY (N/A) as a surgical intervention.  The patient's history has been reviewed, patient examined, no change in status, stable for surgery.  I have reviewed the patient's chart and labs.  Questions were answered to the patient's satisfaction.   ? ? ?[name] ? ? ?

## 2022-03-02 NOTE — Transfer of Care (Signed)
Immediate Anesthesia Transfer of Care Note ? ?Patient: Michelle Robertson ? ?Procedure(s) Performed: Procedure(s) (LRB): ?XI ROBOTIC ASSISTED laparoscopic lysis of adhesions (N/A) ?CYSTOSCOPY (N/A) ?ANTERIOR REPAIR WITH SACROSPINOUS LIGAMENT FIXATION (N/A) ? ?Patient Location: PACU ? ?Anesthesia Type: General ? ?Level of Consciousness: awake, sedated, patient cooperative and responds to stimulation ? ?Airway & Oxygen Therapy: Patient Spontanous Breathing and Patient connected to [place] 02  ? ?Post-op Assessment: Report given to PACU RN, Post -op Vital signs reviewed and stable and Patient moving all extremities ? ?Post vital signs: Reviewed and stable ? ?Complications: No apparent anesthesia complications ?

## 2022-03-02 NOTE — Discharge Instructions (Signed)

## 2022-03-02 NOTE — Op Note (Signed)
Operative Note ? ?Preoperative Diagnosis: anterior vaginal prolapse and uterovaginal prolapse, incomplete ? ?Postoperative Diagnosis: same ? ?Procedures performed:  ?Robotic assisted laparoscopic lysis of adhesions, small bowel serosal repair, sacrosopinous hysteropexy, anterior repair, cystoscopy ? ?Implants: none ? ?Attending Surgeon: Sherlene Shams, MD ? ?Anesthesia: General endotracheal ? ?Findings: 1. On vaginal exam, stage II prolapse noted ? 2. On laparoscopy, extensive adhesions of bowel and omentum to the anterior abdominal wall and right pelvic sidewall. Omental adhesions note on the uterus.  ? 3. On cystoscopy, normal bladder and urethra without injury or lesion. Brisk bilateral ureteral efflux noted.   ? ?Specimens: none ? ?Estimated blood loss: 75 mL ? ?IV fluids: 2300 mL ? ?Urine output: 200 mL ? ?Complications: small bowel serosal injury ? ?Indications: 71yo F with history of posterior repair, now with uterovaginal and anterior vaginal prolapse. She opted for robotic sacrocolpopexy for repair but due to prior history of bowel resection with midline abdominal incision, we also consented for the possibility of vaginal native tissue repair if adhesions prevented completion of the robotic surgery.  ? ?Procedure in Detail: ? ?After informed consent was obtained, the patient was taken to the operating room, where general anesthesia was induced and found to be adequate. She was placed in dorsolithotomy position in yellowfin stirrups. Her hips were noted not to be hyperflexed or hyperextended. Her arms were padded with gel pads and tucked to her sides. Her hands were surrounded by foam. A padded strap was placed across her chest with foam between the pad and her skin. She was noted to be appropriately positioned with all pressure points well padded and off tension. A tilt test showed no slippage. She was prepped and draped in the usual sterile fashion. A uterine manipulator was placed in the uterus after  sounding to 9 cm, an appropriately sized Koh ring was placed around the cervix, and a pneumo-occluder balloon was positioned in the vagina for later use.  A sterile Foley catheter was inserted.  ? ?0.25% plain Marcaine was injected in the left upper quadrant, palmer's point area and an 8 mm supraumbilical skin incision was made with the scalpel.  A Veress needle was inserted into the incision, CO2 insufflation was started, a low opening pressure was noted, and pneumoperitoneum was obtained. The Veress needle was removed and a 20m robotic trocar was placed. The robotic camera was inserted and intraperitoneal placement was confirmed. Survey of the abdomen and pelvis revealed the findings as noted above, specifically adhesions of the omentum and small bowel to the midline incision and to the right pelvic sidewall. Omental adhesions were noted over the uterus. After determining placement for the other ports, Local anesthetic was injected at each site and two 8 mm incisions were made for robotic ports at 10 cm lateral to and at the level of the umbilical port. Two additional 8 mm incisions were made 10 cm lateral to these and 30 degrees down followed by 8 mm robotic ports - the right side for an assistant port. All trocars were placed sequentially under direct visualization of the camera. The patient was placed in Trendelenburg. Omental adhesions in the midline were removed with monopolar endoshears and cautery when needed. Once about half of the adhesions were removed, small bowel was noted to be adhered to the lower midline incision and a small serosal injury was noted. General surgery consult was obtained.  ? ?The robot was docked on the patient's right side. Monopolar endoshears were placed in the right arm, a [place]ipolar  grasper was placed in the 2nd arm of the patient's left side, and a Tip up grasper was placed in the 3rd arm on the patient's left side. Dr Hassell Done presented and assessed the injury and  determined that the serosal injury could be sutured. He removed some of the surrounding adhesions to release tension off of the site of injury. Endoshears were changed to a needle driver and he placed a 2-0 vicryl suture to close the serosal defect and placed a piece of small omentum over the injury. Hemostasis was noted at dissection sites. Arista was placed over the omentum at prior dissection sites. Decision was then made to stop the robotic procedure and proceed with vaginal prolapse repair. The instruments were removed from the abdomen. The robot was undocked. The CO2 gas was removed and the ports were removed.  The skin incisions were closed with subcutaneous stitches of 4-0 Monocryl and covered with skin glue.   ? ?Attention was turned to the vaginal portion of the procedure.  A self-retaining lonestar retractor was placed using four elastic blue stays.  Allis clamps were along the anterior vaginal wall defect. 1% lidocaine with epinephrine was injected into the vaginal mucosa.  A vertical incision was made between these two Allis clamps with a 15 blade scalpel.  Allis clamps were placed along this incision and Metzenbaum scissors were used to undermine the vaginal mucosa along the incision.  The vaginal mucosa was then sharply dissected off to the vesicovaginal septum bilaterally to the level of the pubic rami.   ? ?For the sacrospinous ligament fixation (SSLF), the ischial spine was accessed on the right side via dissection with Mayo scissors and blunt dissection.  The sacrospinous ligament was palpated. Two 0 PDS suture was then placed at the sacrospinous ligament two fingerbreadths medial to the ischial spine, in order to avoid the pudendal neurovascular bundle, using a Capio needle driver.  The PDS suture was attached to the vaginal epithelium on the ipsilateral side of the vaginal apex and held. Anterior plication of the vesicovaginal septum was then performed using plicating sutures of 2-0 Vicryl. The  vaginal mucosal edges were trimmed and the incision reapproximated with 2-0 Vicryl in a running fashion. The SSLF suture was then tied down with excellent support of the anterior and apical vagina. ? ? The Foley catheter was removed.  A 70-degree cystoscope was introduced, and 360-degree inspection revealed no trauma or lesions in the bladder, with bilateral ureteral efflux.  The bladder was drained and the cystoscope was removed.  The Foley catheter was reinserted.  The vagina was copiously irrigated.  Hemostasis was noted.  Vaginal packing was not placed.  A rectal  ? ?The patient tolerated the procedure well.  She was awakened from anesthesia and transferred to the recovery room in stable condition. All counts were correct x 2.   ? ?

## 2022-03-02 NOTE — Anesthesia Procedure Notes (Signed)
Procedure Name: Intubation ?Date/Time: 03/02/2022 7:41 AM ?Performed by: Justice Rocher, CRNA ?Pre-anesthesia Checklist: Patient identified, Emergency Drugs available, Suction available, Patient being monitored and Timeout performed ?Patient Re-evaluated:Patient Re-evaluated prior to induction ?Oxygen Delivery Method: Circle system utilized ?Preoxygenation: Pre-oxygenation with 100% oxygen ?Induction Type: IV induction ?Ventilation: Mask ventilation without difficulty ?Laryngoscope Size: Mac and 3 ?Grade View: Grade II ?Tube type: Oral ?Tube size: 7.0 mm ?Number of attempts: 1 ?Airway Equipment and Method: Stylet and Oral airway ?Placement Confirmation: ETT inserted through vocal cords under direct vision, positive ETCO2 and breath sounds checked- equal and bilateral ?Secured at: 21 cm ?Tube secured with: Tape ?Dental Injury: Teeth and Oropharynx as per pre-operative assessment  ? ? ? ? ?

## 2022-03-02 NOTE — Anesthesia Postprocedure Evaluation (Signed)
Anesthesia Post Note ? ?Patient: Michelle Robertson ? ?Procedure(s) Performed: XI ROBOTIC ASSISTED laparoscopic lysis of adhesions (Abdomen) ?CYSTOSCOPY (Urethra) ?ANTERIOR REPAIR WITH SACROSPINOUS LIGAMENT FIXATION (Vagina ) ? ?  ? ?Patient location during evaluation: PACU ?Anesthesia Type: General ?Level of consciousness: awake and alert and oriented ?Pain management: pain level controlled ?Vital Signs Assessment: post-procedure vital signs reviewed and stable ?Respiratory status: spontaneous breathing, nonlabored ventilation and respiratory function stable ?Cardiovascular status: blood pressure returned to baseline and stable ?Postop Assessment: no apparent nausea or vomiting ?Anesthetic complications: no ? ? ?No notable events documented. ? ?Last Vitals:  ?Vitals:  ? 03/02/22 1115 03/02/22 1130  ?BP: (!) 147/83 (!) 163/83  ?Pulse: 79 73  ?Resp: 18 15  ?Temp: (!) 35.8 ?C (!) 35.9 ?C  ?SpO2: 100% 100%  ?  ?Last Pain:  ?Vitals:  ? 03/02/22 1115  ?TempSrc:   ?PainSc: 0-No pain  ? ? ?  ?  ?  ?  ?  ?  ? ?Cliffard Hair A. ? ? ? ? ?

## 2022-03-02 NOTE — Addendum Note (Signed)
Addendum  created 03/02/22 1209 by Justice Rocher, CRNA  ? Flowsheet accepted  ?  ?

## 2022-03-02 NOTE — Progress Notes (Signed)
Performed voiding trial.  100cc orange urine emptied from catheter bag.  Instilled 240cc NS into bladder.  Pt stated she felt full and couldn't take anymore.  Assisted pt to BR. Pt could only void 75cc.  Walked pt in hallway and then assisted pt back to BR for only 25cc more.  Bladder scan for total of 159cc.  Inserted foley catheter 14FR w/o difficulty.  Will attempt voiding trial again in am.   ?

## 2022-03-03 DIAGNOSIS — N812 Incomplete uterovaginal prolapse: Secondary | ICD-10-CM | POA: Diagnosis not present

## 2022-03-03 LAB — GLUCOSE, CAPILLARY: Glucose-Capillary: 95 mg/dL (ref 70–99)

## 2022-03-03 MED ORDER — OXYCODONE HCL 5 MG PO TABS
ORAL_TABLET | ORAL | Status: AC
  Filled 2022-03-03: qty 1

## 2022-03-03 NOTE — Discharge Summary (Signed)
Physician Discharge Summary  ?Patient ID: ?Michelle Robertson ?MRN: [id number] ?DOB/AGE: 1951/11/11 71 y.o. ? ?Admit date: 03/02/2022 ?Discharge date: 03/03/2022 ? ?Admission Diagnoses: ? ?Discharge Diagnoses:  ?Principal Problem: ?  Uterovaginal prolapse, incomplete ? ? ?Discharged Condition: good ? ?Hospital Course: 71yo F with pelvic organ prolapse admitted s/p robotic lysis of adhesions, small bowel serosal repair, sacrospinous ligament fixation, anterior repair and cystoscopy on 03/02/22. On POD#0 she was not able to pass a voiding trial. This was repeated on POD#1 and she had a PVR of 100 and she was discharged without a catheter. She was tolerating regular diet, ambulating, and pain was well controlled. She met discharge criteria.  ? ?Consults: None ? ?Significant Diagnostic Studies: none ? ?Treatments: surgery: see above ? ?Discharge Exam: ?Blood pressure 120/64, pulse 82, temperature 98.3 ?F (36.8 ?C), resp. rate 16, height 5' 2.5" (1.588 m), weight 59.9 kg, SpO2 96 %. ?General appearance: alert and cooperative ?Resp: clear to auscultation bilaterally ?Cardio: regular rate and rhythm, S1, S2 normal, no murmur, click, rub or gallop ?GI: soft, non-tender; bowel sounds normal; no masses,  no organomegaly. 5 laparoscopic incisions clean and dry, covered with skin glue ?Extremities: extremities normal, atraumatic, no cyanosis or edema ? ?Disposition: Discharge disposition: 01-Home or Self Care ? ? ? ? ? ? ?Discharge Instructions   ? ? Call MD for:  persistant nausea and vomiting   Complete by: As directed ?  ? Call MD for:  redness, tenderness, or signs of infection (pain, swelling, redness, odor or green/yellow discharge around incision site)   Complete by: As directed ?  ? Call MD for:  severe uncontrolled pain   Complete by: As directed ?  ? Call MD for:  temperature >100.4   Complete by: As directed ?  ? Diet Carb Modified   Complete by: As directed ?  ? Increase activity slowly   Complete by: As directed ?  ? May  walk up steps   Complete by: As directed ?  ? No wound care   Complete by: As directed ?  ? ?  ? ?Allergies as of 03/03/2022   ? ?   Reactions  ? Pork-derived Products   ? ?  ? ?  ?Medication List  ?  ? ?TAKE these medications   ? ?acetaminophen 500 MG tablet ?Commonly known as: TYLENOL ?Take 1 tablet (500 mg total) by mouth every 6 (six) hours as needed (pain). ?  ?atorvastatin 20 MG tablet ?Commonly known as: LIPITOR ?Take 1 tablet (20 mg total) by mouth daily. ?  ?CHLOROPHYLL PO ?Take 15 mLs by mouth daily. Vegan liquid ?  ?clonazePAM 0.5 MG tablet ?Commonly known as: KLONOPIN ?Take 1 tablet (0.5 mg total) by mouth 2 (two) times daily as needed. for anxiety ?  ?ferrous sulfate 325 (65 FE) MG EC tablet ?Take 325 mg by mouth in the morning and at bedtime. ?  ?glucose blood test strip ?Commonly known as: ONE TOUCH ULTRA TEST ?Use to test blood sugar three times daily. Dx:E11.9 ?  ?ibuprofen 600 MG tablet ?Commonly known as: ADVIL ?Take 1 tablet (600 mg total) by mouth every 6 (six) hours as needed. ?  ?lisinopril 5 MG tablet ?Commonly known as: ZESTRIL ?Take 1 tablet (5 mg total) by mouth daily. ?  ?metFORMIN 500 MG tablet ?Commonly known as: GLUCOPHAGE ?TAKE 1 TABLET BY MOUTH TWICE DAILY WITH A MEAL for diabetes ?What changed:  ?how much to take ?how to take this ?when to take this ?  ?ONE TOUCH ULTRA  2 w/Device Kit ?Use to test blood sugar three times daily. Dx: E11.9 ?  ?onetouch ultrasoft lancets ?Use to check blood sugar three times daily. Dx:E11.9 ?  ?oxyCODONE 5 MG immediate release tablet ?Commonly known as: Oxy IR/ROXICODONE ?Take 1 tablet (5 mg total) by mouth every 4 (four) hours as needed for severe pain. ?Notes to patient: Next dose due at 1130 am as needed for pain ?  ?polyethylene glycol powder 17 GM/SCOOP powder ?Commonly known as: GLYCOLAX/MIRALAX ?Take 17 g by mouth daily. Drink 17g (1 scoop) dissolved in water per day. ?  ?SYSTANE FREE OP ?Apply to eye as needed. ?  ?Vitamin D 50 MCG (2000 UT)  Caps ?Take 1 capsule by mouth daily. ?  ? ?  ? ? ? ?Signed: ?[name] ?03/03/2022, 9:33 AM ? ? ?

## 2022-03-03 NOTE — Progress Notes (Signed)
Dr. Wannetta Sender notified of pt voided 200 cc bladder scanned 3 times for 117, 194, and 221. Had instilled 240 of normal saline.  Will continue to monitor  ?

## 2022-03-06 ENCOUNTER — Encounter (HOSPITAL_BASED_OUTPATIENT_CLINIC_OR_DEPARTMENT_OTHER): Payer: Self-pay | Admitting: Obstetrics and Gynecology

## 2022-03-07 ENCOUNTER — Encounter: Payer: Self-pay | Admitting: *Deleted

## 2022-03-13 ENCOUNTER — Other Ambulatory Visit: Payer: Self-pay | Admitting: Obstetrics and Gynecology

## 2022-03-13 DIAGNOSIS — M7918 Myalgia, other site: Secondary | ICD-10-CM

## 2022-03-13 MED ORDER — GABAPENTIN 100 MG PO CAPS: 100.0000 mg | ORAL_CAPSULE | Freq: Three times a day (TID) | ORAL | 0 refills | Status: DC

## 2022-03-13 NOTE — Progress Notes (Signed)
Pt called stating she is having some buttock pain down her leg. She is coming in for an appt on 03/15/22. She is s/p Robotic assisted laparoscopic lysis of adhesions, small bowel serosal repair, sacrosopinous hysteropexy, anterior repair, cystoscopy on 03/02/22. Suspect nerve irritation from the sacrospinous fixation. Prescribed gabapentin '100mg'$  TID.  ?

## 2022-03-15 ENCOUNTER — Encounter: Payer: Self-pay | Admitting: Obstetrics and Gynecology

## 2022-03-15 ENCOUNTER — Ambulatory Visit (INDEPENDENT_AMBULATORY_CARE_PROVIDER_SITE_OTHER): Payer: Medicare Other | Admitting: Obstetrics and Gynecology

## 2022-03-15 VITALS — BP 161/89 | HR 69

## 2022-03-15 DIAGNOSIS — G8918 Other acute postprocedural pain: Secondary | ICD-10-CM

## 2022-03-15 MED ORDER — TRAMADOL HCL 50 MG PO TABS: 50.0000 mg | ORAL_TABLET | Freq: Three times a day (TID) | ORAL | 0 refills | Status: AC | PRN

## 2022-03-15 NOTE — Progress Notes (Signed)
[facility] ? ?Date of Visit: 03/15/2022 ? ?History of Present Illness: Ms. Kohrs is a 71 y.o. female scheduled today for a post-operative visit.  ?? Surgery: s/p Robotic assisted laparoscopic lysis of adhesions, small bowel serosal repair, sacrosopinous hysteropexy, anterior repair, cystoscopy on 03/02/22 ?? She passed her postoperative void trial.  ? ?Having pain in the right buttock area, mostly with standing. Sometimes it radiates down her thigh. Improved with sitting. Able to walk without difficulty. Took oxycodone but made her feel loopy. Was prescribed gabapentin and took one dose and feels like she was able to relax.  ? ?Has been taking miralax but has been about 2-3 days since she had a BM. But previously had regular BM. Denies nausea. Denies fever or chills. Has noticed some vaginal bleeding with standing.  ? ?No longer feeling a vaginal bulge.  ? ?Medications: She has a current medication list which includes the following prescription(s): acetaminophen, atorvastatin, one touch ultra 2, chlorophyll, vitamin d, clonazepam, ferrous sulfate, gabapentin, glucose blood, ibuprofen, onetouch ultrasoft, lisinopril, metformin, oxycodone, polyethyl glycol-propyl glycol, polyethylene glycol powder, and tramadol.  ? ?Allergies: Patient is allergic to pork-derived products.  ? ?Physical Exam: ?BP (!) 161/89   Pulse 69   ?Abdomen: soft, non-tender, without masses or organomegaly ?Buttock- non tender bilaterally ?Pelvic Examination: Vagina: Incisions healing well. Sutures are present at anterior incision line and apex. No tenderness along the anterior or posterior vagina. No apical tenderness. No pelvic masses.  ? ?POP-Q: ?deferred ?--------------------------------------------------------- ? ?Assessment and Plan:  ?1. Post-op pain   ? ? ?- We discussed that she likely has some nerve irritation from the sacrospinous fixation. She is able to ambulate well.  ?- previously prescribed gabapentin '100mg'$  and she  has only taken one dose so far. Advised to take 3x per day.  ?- Also will prescribe tramadol '50mg'$  q6 prn since she is not tolerating the oxycodone. Continue with the ibuprofen '600mg'$ .  ? ?Follow up 4 weeks ? ?[name], MD ? ? ?

## 2022-04-12 ENCOUNTER — Encounter: Payer: Self-pay | Admitting: Obstetrics and Gynecology

## 2022-04-12 ENCOUNTER — Ambulatory Visit (INDEPENDENT_AMBULATORY_CARE_PROVIDER_SITE_OTHER): Payer: Medicare Other | Admitting: Obstetrics and Gynecology

## 2022-04-12 VITALS — BP 138/77 | HR 98

## 2022-04-12 DIAGNOSIS — R351 Nocturia: Secondary | ICD-10-CM

## 2022-04-12 DIAGNOSIS — K5904 Chronic idiopathic constipation: Secondary | ICD-10-CM

## 2022-04-12 DIAGNOSIS — Z9889 Other specified postprocedural states: Secondary | ICD-10-CM

## 2022-04-12 MED ORDER — TROSPIUM CHLORIDE 20 MG PO TABS: 20.0000 mg | ORAL_TABLET | Freq: Two times a day (BID) | ORAL | 5 refills | Status: DC

## 2022-04-12 NOTE — Progress Notes (Signed)
[facility]  Date of Visit: 04/12/2022  History of Present Illness: Ms. Weakland is a 71 y.o. female scheduled today for a post-operative visit.   Surgery: s/p s/p Robotic assisted laparoscopic lysis of adhesions, small bowel serosal repair, sacrosopinous hysteropexy, anterior repair, cystoscopy on 03/02/22   She passed her postoperative void trial.   Postoperative course  was complicated by right buttock pain which improved with gabapentin.  - She is also s/p posterior repair on 10/23/21.  Today she reports she is feeling well. Occasionally will notice a few drops of urine on her underwear but does not happen frequently.   UTI in the last 6 weeks? No  Pain? No  Vaginal bulge? No  Stress incontinence: rare Urgency/frequency:  Is waking up 6 times per night to urinate Urge incontinence: No  Voiding dysfunction: No  Bowel issues: Yes - using Miralax twice a week and has bowel movements every 3 days.   Subjective Success: Do you usually have a bulge or something falling out that you can see or feel in the vaginal area? No  Retreatment Success: Any retreatment with surgery or pessary for any compartment? No   Medications: She has a current medication list which includes the following prescription(s): trospium, acetaminophen, atorvastatin, one touch ultra 2, chlorophyll, vitamin d, clonazepam, ferrous sulfate, gabapentin, glucose blood, ibuprofen, onetouch ultrasoft, lisinopril, metformin, oxycodone, polyethyl glycol-propyl glycol, and polyethylene glycol powder.   Allergies: Patient is allergic to pork-derived products.   Physical Exam: BP 138/77   Pulse 98    Pelvic Examination: Vagina: Incisions healing well. Sutures are present at the apex and there is not granulation tissue. No tenderness along the anterior or posterior vagina. No apical tenderness. No pelvic masses.   POP-Q: POP-Q  -3                                            Aa   -3                                            Ba  -8                                              C   3.5                                            Gh  4.5                                            Pb  9                                            tvl   -2.5  Ap  -2.5                                            Bp  -8.5                                              D     ---------------------------------------------------------  Assessment and Plan:  1. Post-operative state   2. Nocturia   3. Chronic idiopathic constipation     - Doing well post-operatively.  - Can resume regular activity including exercise and intercourse,  if desired.  - Discussed avoidance of heavy lifting and straining long term to reduce the risk of recurrence. - For nocturia, will start trospium '20mg'$  BID.  - Also discussed increasing miralax to every day to prevent constipation and straining   Return 6 weeks  [name], MD

## 2022-06-05 ENCOUNTER — Ambulatory Visit: Payer: Medicare Other | Admitting: Obstetrics and Gynecology

## 2022-06-13 ENCOUNTER — Ambulatory Visit: Payer: Medicare Other | Admitting: Obstetrics and Gynecology

## 2022-06-13 NOTE — Progress Notes (Deleted)
[facility] Return Visit  SUBJECTIVE  History of Present Illness: Michelle Robertson is a 71 y.o. female seen in follow-up for ***. Plan at last visit was ***.     Surgery: s/p s/p Robotic assisted laparoscopic lysis of adhesions, small bowel serosal repair, sacrosopinous hysteropexy, anterior repair, cystoscopy on 03/02/22  - She is also s/p posterior repair on 10/23/21.  Past Medical History: Patient  has a past medical history of Anxiety, Arthritis, Carotid stenosis, asymptomatic, bilateral (12/2017), Essential hypertension, Fall (11/06/2021), Full dentures, GERD (gastroesophageal reflux disease), Heart palpitations (2017), Hiatal hernia, History of chest pain, History of COVID-19 (06/2021), History of ectopic pregnancy (1991), History of panic attacks, IDA (iron deficiency anemia), OSA (obstructive sleep apnea), PAC (premature atrial contraction) (2017), Posterior vaginal wall prolapse, Presence of pessary, SOB (shortness of breath) on exertion, Type 2 diabetes mellitus ([place]), and Wears glasses.   Past Surgical History: She  has a past surgical history that includes Tubal ligation (Bilateral); Ectopic pregnancy surgery (1991); Rectocele repair (N/A, 10/23/2021); Exploratory laparotomy; Robotic assisted laparoscopic lysis of adhesion (N/A, 03/02/2022); Cystoscopy (N/A, 03/02/2022); and Anterior and posterior repair with sacrospinous fixation (N/A, 03/02/2022).   Medications: She has a current medication list which includes the following prescription(s): acetaminophen, atorvastatin, one touch ultra 2, chlorophyll, vitamin d, clonazepam, ferrous sulfate, gabapentin, glucose blood, ibuprofen, onetouch ultrasoft, lisinopril, metformin, oxycodone, polyethyl glycol-propyl glycol, polyethylene glycol powder, and trospium.   Allergies: Patient is allergic to pork-derived products.   Social History: Patient  reports that she has never smoked. She has never used smokeless tobacco. She reports  that she does not drink alcohol and does not use drugs.      OBJECTIVE     Physical Exam: There were no vitals filed for this visit. Gen: No apparent distress, A&O x 3.  Detailed Urogynecologic Evaluation:  Deferred. Prior exam showed:      No data to display             ASSESSMENT AND PLAN    [name] is a 71 y.o. with:  No diagnosis found.

## 2022-09-07 ENCOUNTER — Other Ambulatory Visit: Payer: Self-pay

## 2022-09-07 ENCOUNTER — Emergency Department
Admission: EM | Admit: 2022-09-07 | Discharge: 2022-09-07 | Disposition: A | Discharge status: Home / Self Care | Payer: Medicare Other | Attending: Emergency Medicine | Admitting: Emergency Medicine

## 2022-09-07 ENCOUNTER — Other Ambulatory Visit (HOSPITAL_BASED_OUTPATIENT_CLINIC_OR_DEPARTMENT_OTHER): Payer: Self-pay | Admitting: Cardiovascular Disease

## 2022-09-07 ENCOUNTER — Emergency Department: Payer: Medicare Other

## 2022-09-07 DIAGNOSIS — R0789 Other chest pain: Secondary | ICD-10-CM | POA: Diagnosis present

## 2022-09-07 LAB — CBC
HCT: 31.6 % — ABNORMAL LOW (ref 36.0–46.0)
Hemoglobin: 10.1 g/dL — ABNORMAL LOW (ref 12.0–15.0)
MCH: 27.7 pg (ref 26.0–34.0)
MCHC: 32 g/dL (ref 30.0–36.0)
MCV: 86.6 fL (ref 80.0–100.0)
Platelets: 296 10*3/uL (ref 150–400)
RBC: 3.65 MIL/uL — ABNORMAL LOW (ref 3.87–5.11)
RDW: 13.7 % (ref 11.5–15.5)
WBC: 6.4 10*3/uL (ref 4.0–10.5)
nRBC: 0 % (ref 0.0–0.2)

## 2022-09-07 LAB — BASIC METABOLIC PANEL
Anion gap: 8 (ref 5–15)
BUN: 22 mg/dL (ref 8–23)
CO2: 23 mmol/L (ref 22–32)
Calcium: 9.2 mg/dL (ref 8.9–10.3)
Chloride: 105 mmol/L (ref 98–111)
Creatinine, Ser: 1.06 mg/dL — ABNORMAL HIGH (ref 0.44–1.00)
GFR, Estimated: 57 mL/min — ABNORMAL LOW (ref >60)
Glucose, Bld: 183 mg/dL — ABNORMAL HIGH (ref 70–99)
Potassium: 4.2 mmol/L (ref 3.5–5.1)
Sodium: 136 mmol/L (ref 135–145)

## 2022-09-07 LAB — TROPONIN I (HIGH SENSITIVITY): Troponin I (High Sensitivity): 4 ng/L (ref <18)

## 2022-09-07 NOTE — ED Provider Notes (Signed)
[facility] Provider Note    Event Date/Time   First MD Initiated Contact with Patient 09/07/22 1657     (approximate)   History   Fatigue and chest pressure  HPI  Michelle Robertson is a 71 y.o. female who presents after having episode of chest pressure this morning after eating.  She reports this was approximately 10 AM.  Has not had it again since then.  She does state that she feels fatigued today more than typical.  No shortness of breath, no pleurisy.  No lower extremity swelling or pain.  No fevers chills or cough.  She does see Dr. Oval Linsey of cardiology      Physical Exam   Triage Vital Signs: ED Triage Vitals  Enc Vitals Group     BP 09/07/22 1558 136/83     Pulse Rate 09/07/22 1558 80     Resp 09/07/22 1558 16     Temp 09/07/22 1558 98.6 F (37 C)     Temp Source 09/07/22 1558 Oral     SpO2 09/07/22 1558 100 %     Weight 09/07/22 1617 59 kg (130 lb)     Height 09/07/22 1617 1.575 m ('5\' 2"'$ )     Head Circumference --      Peak Flow --      Pain Score 09/07/22 1616 0     Pain Loc --      Pain Edu? --      Excl. in [place]? --     Most recent vital signs: Vitals:   09/07/22 1558  BP: 136/83  Pulse: 80  Resp: 16  Temp: 98.6 F (37 C)  SpO2: 100%     General: Awake, no distress.  CV:  Good peripheral perfusion.  Regular rate and rhythm Resp:  Normal effort.  Clear to auscultation bilaterally Abd:  No distention.  Other:     ED Results / Procedures / Treatments   Labs (all labs ordered are listed, but only abnormal results are displayed) Labs Reviewed  BASIC METABOLIC PANEL - Abnormal; Notable for the following components:      Result Value   Glucose, Bld 183 (*)    Creatinine, Ser 1.06 (*)    GFR, Estimated 57 (*)    All other components within normal limits  CBC - Abnormal; Notable for the following components:   RBC 3.65 (*)    Hemoglobin 10.1 (*)    HCT 31.6 (*)    All other components within normal limits  TROPONIN  I (HIGH SENSITIVITY)  TROPONIN I (HIGH SENSITIVITY)     EKG  ED ECG REPORT I, Lavonia Drafts, the attending physician, personally viewed and interpreted this ECG.  Date: 09/07/2022  Rhythm: normal sinus rhythm QRS Axis: normal Intervals: normal ST/T Wave abnormalities: normal Narrative Interpretation: Frequent PVCs noted    RADIOLOGY Chest x-ray viewed and interpreted by me, no acute abnormality.      PROCEDURES:  Critical Care performed:   Procedures   MEDICATIONS ORDERED IN ED: Medications - No data to display   IMPRESSION / MDM / [place] / ED COURSE  I reviewed the triage vital signs and the nursing notes. Patient's presentation is most consistent with acute presentation with potential threat to life or bodily function.   Patient presents with episode of chest pressure as detailed above.  She is asymptomatic here and quite well-appearing.  EKG is unchanged from prior.  She has seen cardiology in the past for PVCs.  Differential includes GERD, ACS, pneumonia.  EKG and high sensitive troponin are normal, not consistent with ACS.  Chest x-ray is benign, no evidence of pneumonia.  Patient will trial OTC antacids if this occurs again, we will have her follow-up closely with cardiology, return precautions discussed.       FINAL CLINICAL IMPRESSION(S) / ED DIAGNOSES   Final diagnoses:  Atypical chest pain     Rx / DC Orders   ED Discharge Orders          Ordered    Ambulatory referral to Cardiology       Comments: If you have not heard from the Cardiology office within the next 72 hours please call [phone].   09/07/22 1757             Note:  This document was prepared using Dragon voice recognition software and may include unintentional dictation errors.   Lavonia Drafts, MD 09/07/22 1946

## 2022-09-07 NOTE — Telephone Encounter (Signed)
Rx request sent to pharmacy.  

## 2022-09-07 NOTE — ED Triage Notes (Signed)
C/o chest pressure, fatigue and nausea today. Denies SOB, dizziness. Pt. States she has been urinating frequently. Pt. Is diabetic, has been checking BGL frequently and states they are within normal limits for her.

## 2022-09-07 NOTE — ED Provider Triage Note (Signed)
Emergency Medicine Provider Triage Evaluation Note  Michelle Robertson, a 71 y.o. female with a history of diabetes, HLD, HTN, and PACs, was evaluated in triage.  Pt complains of some chest pressure centrally, as well as some associated fatigue and nausea.  Patient reports onset of symptoms just a few hours prior to arrival patient denies any frank shortness of breath or dyspnea.   Review of Systems  Positive: CP, fatigue Negative: SOB  Physical Exam  BP 136/83   Pulse 80   Temp 98.6 F (37 C) (Oral)   Resp 16   Ht '5\' 2"'$  (1.575 m)   Wt 59 kg   SpO2 100%   BMI 23.78 kg/m  Gen:   Awake, no distress  NAD Resp:  Normal effort CTA MSK:   Moves extremities without difficulty  Other:    Medical Decision Making  Medically screening exam initiated at 4:31 PM.  Appropriate orders placed.  Michelle Robertson was informed that the remainder of the evaluation will be completed by another provider, this initial triage assessment does not replace that evaluation, and the importance of remaining in the ED until their evaluation is complete.  Patient to the ED for evaluation of several hours of central chest pressure with associated fatigue and nausea.   Melvenia Needles, PA-C 09/07/22 [phone]

## 2022-09-13 ENCOUNTER — Encounter (HOSPITAL_BASED_OUTPATIENT_CLINIC_OR_DEPARTMENT_OTHER): Payer: Self-pay | Admitting: Cardiovascular Disease

## 2022-09-13 ENCOUNTER — Ambulatory Visit (INDEPENDENT_AMBULATORY_CARE_PROVIDER_SITE_OTHER): Payer: Medicare Other | Admitting: Cardiovascular Disease

## 2022-09-13 VITALS — BP 140/84 | HR 75 | Ht 62.0 in | Wt 132.5 lb

## 2022-09-13 DIAGNOSIS — R079 Chest pain, unspecified: Secondary | ICD-10-CM | POA: Diagnosis not present

## 2022-09-13 DIAGNOSIS — E7800 Pure hypercholesterolemia, unspecified: Secondary | ICD-10-CM | POA: Diagnosis not present

## 2022-09-13 DIAGNOSIS — I493 Ventricular premature depolarization: Secondary | ICD-10-CM | POA: Diagnosis not present

## 2022-09-13 DIAGNOSIS — I10 Essential (primary) hypertension: Secondary | ICD-10-CM

## 2022-09-13 DIAGNOSIS — Z5181 Encounter for therapeutic drug level monitoring: Secondary | ICD-10-CM

## 2022-09-13 HISTORY — DX: Chest pain, unspecified: R07.9

## 2022-09-13 MED ORDER — METOPROLOL TARTRATE 50 MG PO TABS: ORAL_TABLET | ORAL | 0 refills | Status: DC

## 2022-09-13 MED ORDER — LISINOPRIL 10 MG PO TABS: 10.0000 mg | ORAL_TABLET | Freq: Every day | ORAL | 3 refills | Status: AC

## 2022-09-13 NOTE — Assessment & Plan Note (Signed)
She continues to have PVCs and PACs.  She did not tolerate metoprolol in the past.  Overall stable.

## 2022-09-13 NOTE — Assessment & Plan Note (Signed)
Blood pressure uncontrolled.  Increase lisinopril to 10 mg.  Work on increasing exercise.  Check CMP in a week or 2.

## 2022-09-13 NOTE — Progress Notes (Signed)
Cardiology Office Note  Date:  09/13/2022   ID:  Michelle Robertson, DOB 26-Dec-1950, MRN [id number]  PCP:  Sonia Side., FNP  Cardiologist:   Skeet Latch, MD   No chief complaint on file.   Patient ID: Michelle Robertson is a 71 y.o. female with diabetes mellitus type 2, PACs, PVCs and hyperlipidemia who presents for follow up.  Michelle Robertson was seen in the ED on 01/2016 with palpitations.  She reported intermittent episodes of nausea and palpitations.  It was associated with shortness of breath, nausea and occasionally dizziness.   In the ED EKG revealed sinus rhythm with occasional PVCs.  Labs revealed mild anemia that was stable and no electrolyte abnormalities.  Michelle Robertson had an echo in 2014 that revealed LVEF 65-70% and was otherwise unremarkable.  She was started on metoprolol 65m daily, which helped.   She wore a 7 day event monitor that showed occasional PACs.  At her last appointment Michelle Robertson' blood pressure was poorly controlled and she had frequent palpitations.  Therefore metoprolol was increased.  She was also started on rosuvastatin.   Michelle Robertson husband passed away 8/201.  [place]  [place] she was settling his estate and was under a lot of stress.  She had some episodes of high blood pressure and palpitations.      Ms. CLeskewas seen in the ED 08/2021 for chest pain.  Cardiac enzymes were negative but D-dimer was mildly elevated. She had a chest CT that was negative for PE. There was calcification of the aorta but no coronary calcification.   She continued to have intermittent chest pain.  Given that she previously had a calcium score of 0 and had no coronary disease on her recent chest CT, no ischemic evaluation was pursued.  She did have an echo 09/2021 which revealed LVEF 60 to 65% with grade 1 diastolic dysfunction.  She was given seen in the ED 08/2022 with atypical chest pain.  Cardiac enzymes were negative.  Chest x-ray was  unremarkable.  Symptoms seem most consistent with GERD.    Past Medical History:  Diagnosis Date   Anxiety    Arthritis    Carotid stenosis, asymptomatic, bilateral 12/2017   last ultrasound in epic 01-01-2018  bilateral ICA 40-59%   Chest pain of uncertain etiology 140/98/1191  Essential hypertension    Fall 11/06/2021   See ED provider note in Epic dated 11/09/21. Patient fell and hit her head causing a headache and scalp hematoma. 11/09/21 CT Head   & cervical spine normal (see Epic.)   Full dentures    GERD (gastroesophageal reflux disease)    Heart palpitations 2017   cardiology --- dr t. rOval Linsey  (10-18-2021  per intermittant palpitations) event monitor 03-20-2016 SR/ SB w/ occasional PAC and 12 lead ekg showed PVC   Hiatal hernia    History of chest pain    followed by cardiology-- dr t. rOval Linsey  (10-18-2021  oer Michelle Robertson no chest pain since 09-09-2021 & cardiology 09-14-2021 note in epic) CCT -- 12/ 2013 no coronary artery calcification/ atheroscloersis;  02/ 2019 nulcear stresst test, normal perfusion no ischemia, nuclear ef 55%;  02/ 2019  carotid [place] bilateral ICA 40-59% stenosis;  last echo 11/ 2022 ef 60-65%, G1DD, trivial MR;  CTA 10/ 2022 normal   History of COVID-19 009-13-22  per Michelle Robertson mild symptoms that resolved   History of ectopic pregnancy 1991   s/p  surgery   History of panic attacks    IDA (iron deficiency anemia)    OSA (obstructive sleep apnea)    10-18-2021  per Michelle Robertson had study 2 months ago, waiting on cpap due to being on backorder   PAC (premature atrial contraction) 2017   occasional per event monitor 05/ 2017   Posterior vaginal wall prolapse    Presence of pessary    SOB (shortness of breath) on exertion    per Michelle Robertson with stairs, house chores without sob, and long distance walking with occasional sob   Type 2 diabetes mellitus ([place])    followed by pcp   (10-18-2021  Michelle Robertson stated check blood sugar 1-2 times daily,  fasting sugar-- 90--121)   Wears glasses      Past Surgical History:  Procedure Laterality Date   ANTERIOR AND POSTERIOR REPAIR WITH SACROSPINOUS FIXATION N/A 03/02/2022   Procedure: ANTERIOR REPAIR WITH SACROSPINOUS LIGAMENT FIXATION;  Surgeon: [name], MD;  Location: [place];  Service: Gynecology;  Laterality: N/A;   CYSTOSCOPY N/A 03/02/2022   Procedure: CYSTOSCOPY;  Surgeon: [name], MD;  [facility];  Service: Gynecology;  Laterality: N/A;   ECTOPIC PREGNANCY SURGERY  1991   EXPLORATORY LAPAROTOMY     for bowel obstruction   RECTOCELE REPAIR N/A 10/23/2021   Procedure: POSTERIOR REPAIR (RECTOCELE);  Surgeon: [name], MD;  Location: [facility];  Service: Gynecology;  Laterality: N/A;   ROBOTIC ASSISTED LAPAROSCOPIC LYSIS OF ADHESION N/A 03/02/2022   Procedure: XI ROBOTIC ASSISTED laparoscopic lysis of adhesions;  Surgeon: [name], MD;  Location[facility];  Service: Gynecology;  Laterality: N/A;   TUBAL LIGATION Bilateral    yrs ago    Current Outpatient Medications  Medication Sig Dispense Refill   atorvastatin (LIPITOR) 20 MG tablet Take 1 tablet (20 mg total) by mouth daily. 90 tablet 3   Blood Glucose Monitoring Suppl (ONE TOUCH ULTRA 2) w/Device KIT Use to test blood sugar three times daily. Dx: E11.9 100 each 11   CHLOROPHYLL PO Take 15 mLs by mouth daily. Vegan liquid     Cholecalciferol (VITAMIN D) 50 MCG (2000 UT) CAPS Take 1 capsule by mouth daily.     clonazePAM (KLONOPIN) 0.5 MG tablet Take 1 tablet (0.5 mg total) by mouth 2 (two) times daily as needed. for anxiety (Patient taking differently: Take 0.5 mg by mouth 2 (two) times daily as needed. for anxiety) 60 tablet 0   ferrous sulfate 325 (65 FE) MG EC tablet Take 325 mg by mouth in the morning and at bedtime.     glucose blood (ONE TOUCH ULTRA TEST) test strip Use to test blood sugar three times daily. Dx:E11.9 100 each 11   Lancets  (ONETOUCH ULTRASOFT) lancets Use to check blood sugar three times daily. Dx:E11.9 100 each 11   lisinopril (ZESTRIL) 5 MG tablet TAKE 1 TABLET (5 MG TOTAL) BY MOUTH DAILY. 90 tablet 3   metFORMIN (GLUCOPHAGE) 500 MG tablet TAKE 1 TABLET BY MOUTH TWICE DAILY WITH A MEAL for diabetes (Patient taking differently: Take 500 mg by mouth 2 (two) times daily with a meal. TAKE 1 TABLET BY MOUTH TWICE DAILY WITH A MEAL for diabetes) 180 tablet 1   Polyethyl Glycol-Propyl Glycol (SYSTANE FREE OP) Apply to eye as needed.     polyethylene glycol powder (GLYCOLAX/MIRALAX) 17 GM/SCOOP powder Take 17 g by mouth daily. Drink 17g (1 scoop) dissolved in water per  day. 255 g 0   No current facility-administered medications for this visit.    Allergies:   Pork-derived products    Social History:  The patient  reports that she has never smoked. She has never used smokeless tobacco. She reports that she does not drink alcohol and does not use drugs.   Family History:  The patient's family history includes Cancer in her father; Diabetes in her maternal aunt, mother, and paternal grandmother; Stroke in her mother.    ROS:   Please see the history of present illness. (+) Right LE weakness, heaviness, and pain (+) Fatigue (+) Chest pain (+) acid reflux (+) Back pain All other systems are reviewed and negative.     PHYSICAL EXAM: VS:  BP (!) 140/84 (BP Location: Right Arm, Patient Position: Sitting, Cuff Size: Normal)   Pulse 75   Ht '5\' 2"'  (1.575 m)   Wt 132 lb 8 oz (60.1 kg)   BMI 24.23 kg/m  , BMI Body mass index is 24.23 kg/m. GENERAL:  Well appearing HEENT: Pupils equal round and reactive, fundi not visualized, oral mucosa unremarkable NECK:  No jugular venous distention, waveform within normal limits, carotid upstroke brisk and symmetric, no bruits, no thyromegaly LUNGS:  Clear to auscultation bilaterally HEART:  RRR.  PMI not displaced or sustained,S1 and S2 within normal limits, no S3, no S4, no  clicks, no rubs, no murmurs ABD:  Flat, positive bowel sounds normal in frequency in pitch, no bruits, no rebound, no guarding, no midline pulsatile mass, no hepatomegaly, no splenomegaly EXT:  2 plus pulses throughout, no edema, no cyanosis no clubbing SKIN:  No rashes no nodules NEURO:  Cranial nerves II through XII grossly intact, motor grossly intact throughout PSYCH:  Cognitively intact, oriented to person place and time  EKG:   03/12/16: Sinus bradycardia.  Rate 59 bpm.   12/03/17: Sinus rhythm.  Rate 63 bpm.   07/08/20: Sinus rhythm.  Rate 71 bpm.  Nonspecific T wave abnormality. 09/14/2021: EKG was not ordered today.  7 Day Event Monitor 03/20/16:   Quality: Fair.  Baseline artifact. Predominant rhythm: sinus rhythm, sinus bradycardia Average heart rate: 70 bpm Max heart rate: 121 bpm Min heart rate: 47 bpm Pauses >2.5 seconds: 0   Occasional PACs.  Echo 10/06/21:  1. Left ventricular ejection fraction, by estimation, is 60 to 65%. The  left ventricle has normal function. The left ventricle has no regional  wall motion abnormalities. Left ventricular diastolic parameters are  consistent with Grade I diastolic  dysfunction (impaired relaxation). The average left ventricular global  longitudinal strain is -18.8 %. The global longitudinal strain is normal.   2. Right ventricular systolic function is normal. The right ventricular  size is normal.   3. The mitral valve is normal in structure. Trivial mitral valve  regurgitation. No evidence of mitral stenosis.   4. The aortic valve is tricuspid. Aortic valve regurgitation is not  visualized. No aortic stenosis is present.   5. The inferior vena cava is normal in size with greater than 50%  respiratory variability, suggesting right atrial pressure of 3 mmHg.   Recent Labs: 09/07/2022: BUN 22; Creatinine, Ser 1.06; Hemoglobin 10.1; Platelets 296; Potassium 4.2; Sodium 136    Lipid Panel    Component Value Date/Time   CHOL  251 (H) 08/01/2018 1400   CHOL 245 (H) 02/19/2018 1030   TRIG 213 (H) 08/01/2018 1400   HDL 45 (L) 08/01/2018 1400   HDL 52 02/19/2018 1030  CHOLHDL 5.6 (H) 08/01/2018 1400   VLDL 20 09/13/2016 0817   LDLCALC 168 (H) 08/01/2018 1400   LDLDIRECT 134.0 07/19/2015 1541      Wt Readings from Last 3 Encounters:  09/13/22 132 lb 8 oz (60.1 kg)  09/07/22 130 lb (59 kg)  03/02/22 132 lb 1.6 oz (59.9 kg)      ASSESSMENT AND PLAN: Essential hypertension Blood pressure uncontrolled.  Increase lisinopril to 10 mg.  Work on increasing exercise.  Check CMP in a week or 2.  PVC (premature ventricular contraction) She continues to have PVCs and PACs.  She did not tolerate metoprolol in the past.  Overall stable.  Chest pain of uncertain etiology She continues to have chest pain.  Overall her symptoms don't seem consistent with ischemia.  This has been ongoing so we will get a definitive assessment.  Coronary CT-A.  Metoprolol 66m prior to the CT.   Current medicines are reviewed at length with the patient today.  The patient does not have concerns regarding medicines.  The following changes have been made: Increase lisinopril  Labs/ tests ordered today include:   No orders of the defined types were placed in this encounter.    Disposition:   FU with Quaran Kedzierski C. ROval Linsey MD, [facility] 1 year.  APP in 2 months.   Signed, Jony Ladnier C. ROval Linsey MD, [facility] 09/13/2022 10:28 AM    [facility]

## 2022-09-13 NOTE — Patient Instructions (Signed)
Medication Instructions:  INCREASE YOUR LISINOPRIL TO 10 MG DAILY   TAKE METOPROLOL 50 MG 1 TABLET 2 HOURS PRIOR TO CT   *If you need a refill on your cardiac medications before your next appointment, please call your pharmacy*  Lab Work: FASTING LP/CMET 1 WEEK PRIOR TO CT   If you have labs (blood work) drawn today and your tests are completely normal, you will receive your results only by: [place] (if you have MyChart) OR A paper copy in the mail If you have any lab test that is abnormal or we need to change your treatment, we will call you to review the results.  Testing/Procedures: Your physician has requested that you have cardiac CT. Cardiac computed tomography (CT) is a painless test that uses an x-ray machine to take clear, detailed pictures of your heart. For further information please visit HugeFiesta.[email or web address]. Please follow instruction sheet as given.  Follow-Up: At [facility] you and your health needs are our priority.  As part of our continuing mission to provide you with exceptional heart care, we have created designated Provider Care Teams.  These Care Teams include your primary Cardiologist (physician) and Advanced Practice Providers (APPs -  Physician Assistants and Nurse Practitioners) who all work together to provide you with the care you need, when you need it.  We recommend signing up for the patient portal called "MyChart".  Sign up information is provided on this After Visit Summary.  MyChart is used to connect with patients for Virtual Visits (Telemedicine).  Patients are able to view lab/test results, encounter notes, upcoming appointments, etc.  Non-urgent messages can be sent to your provider as well.   To learn more about what you can do with MyChart, go to [email or web address].    Your next appointment:   12 month(s)  The format for your next appointment:   In Person  Provider:   Skeet Latch, MD    1 to 2 months with Overton Mam  NP  Other Instructions    Your cardiac CT will be scheduled at one of the below locations:   [facility] [address] [place], [place] [address] [phone]  [place] [address] [place], [place] [address] [phone]  [facility] [place], [place] [address] [phone]  If scheduled at [facility], please arrive at the [facility] (Entrance C2) of [facility] 30 minutes prior to test start time. You can use the FREE valet parking offered at entrance C (encouraged to control the heart rate for the test)  Proceed to the [facility] Radiology Department (first floor) to check-in and test prep.  All radiology patients and guests should use entrance C2 at [facility], accessed from [facility], even though the hospital's physical address listed is [address]    If scheduled at [facility] or [facility], please arrive 15 mins early for check-in and test prep.  Please follow these instructions carefully (unless otherwise directed):  On the Night Before the Test: Be sure to Drink plenty of water. Do not consume any caffeinated/decaffeinated beverages or chocolate 12 hours prior to your test. Do not take any antihistamines 12 hours prior to your test.  On the Day of the Test: Drink plenty of water until 1 hour prior to the test. Do not eat any food 1 hour  prior to test. You may take your regular medications prior to the test.  Take metoprolol (Lopressor) two hours prior to test. HOLD Furosemide/Hydrochlorothiazide morning of the test. FEMALES- please wear underwire-free bra if available, avoid dresses & tight clothing      After the Test: Drink plenty of water. After receiving IV contrast, you may experience a mild flushed feeling. This is  normal. On occasion, you may experience a mild rash up to 24 hours after the test. This is not dangerous. If this occurs, you can take Benadryl 25 mg and increase your fluid intake. If you experience trouble breathing, this can be serious. If it is severe call 911 IMMEDIATELY. If it is mild, please call our office. If you take any of these medications: Glipizide/Metformin, Avandament, Glucavance, please do not take 48 hours after completing test unless otherwise instructed.  We will call to schedule your test 2-4 weeks out understanding that some insurance companies will need an authorization prior to the service being performed.   For non-scheduling related questions, please contact the cardiac imaging nurse navigator should you have any questions/concerns: Marchia Bond, Cardiac Imaging Nurse Navigator Gordy Clement, Cardiac Imaging Nurse Navigator [facility] Direct Office Dial: [phone]   For scheduling needs, including cancellations and rescheduling, please call [place], [phone].

## 2022-09-13 NOTE — Assessment & Plan Note (Addendum)
She continues to have chest pain.  Overall her symptoms don't seem consistent with ischemia.  This has been ongoing so we will get a definitive assessment.  Coronary CT-A.  Metoprolol '50mg'$  prior to the CT.

## 2022-09-18 ENCOUNTER — Ambulatory Visit: Payer: Medicare Other | Attending: Cardiovascular Disease

## 2022-09-18 LAB — COMPREHENSIVE METABOLIC PANEL
ALT: 12 IU/L (ref 0–32)
AST: 16 IU/L (ref 0–40)
Albumin/Globulin Ratio: 2 (ref 1.2–2.2)
Albumin: 4.7 g/dL (ref 3.9–4.9)
Alkaline Phosphatase: 74 IU/L (ref 44–121)
BUN/Creatinine Ratio: 13 (ref 12–28)
BUN: 16 mg/dL (ref 8–27)
Bilirubin Total: 0.4 mg/dL (ref 0.0–1.2)
CO2: 25 mmol/L (ref 20–29)
Calcium: 9.6 mg/dL (ref 8.7–10.3)
Chloride: 104 mmol/L (ref 96–106)
Creatinine, Ser: 1.21 mg/dL — ABNORMAL HIGH (ref 0.57–1.00)
Globulin, Total: 2.4 g/dL (ref 1.5–4.5)
Glucose: 107 mg/dL — ABNORMAL HIGH (ref 70–99)
Potassium: 4.6 mmol/L (ref 3.5–5.2)
Sodium: 141 mmol/L (ref 134–144)
Total Protein: 7.1 g/dL (ref 6.0–8.5)
eGFR: 49 mL/min/{1.73_m2} — ABNORMAL LOW (ref >59)

## 2022-09-18 LAB — LIPID PANEL
Chol/HDL Ratio: 3.4 ratio (ref 0.0–4.4)
Cholesterol, Total: 167 mg/dL (ref 100–199)
HDL: 49 mg/dL (ref >39)
LDL Chol Calc (NIH): 99 mg/dL (ref 0–99)
Triglycerides: 102 mg/dL (ref 0–149)
VLDL Cholesterol Cal: 19 mg/dL (ref 5–40)

## 2022-09-26 ENCOUNTER — Telehealth (HOSPITAL_COMMUNITY): Payer: Self-pay | Admitting: *Deleted

## 2022-09-26 NOTE — Telephone Encounter (Signed)
Reaching out to patient to offer assistance regarding upcoming cardiac imaging study; pt verbalizes understanding of appt date/time, parking situation and where to check in,  medications ordered, and verified current allergies; name and call back number provided for further questions should they arise  Gordy Clement RN Navigator Cardiac Imaging [facility] [phone] office [phone] cell  Patient to take '50mg'$  metoprolol tartrate two hours prior to her cardiac CT scan.  She is aware to arrive at 10:30am.

## 2022-09-27 ENCOUNTER — Ambulatory Visit (HOSPITAL_COMMUNITY)
Admission: RE | Admit: 2022-09-27 | Discharge: 2022-09-27 | Disposition: A | Discharge status: Home / Self Care | Payer: Medicare Other | Source: Ambulatory Visit | Attending: Cardiovascular Disease | Admitting: Cardiovascular Disease

## 2022-09-27 DIAGNOSIS — K449 Diaphragmatic hernia without obstruction or gangrene: Secondary | ICD-10-CM | POA: Insufficient documentation

## 2022-09-27 DIAGNOSIS — R079 Chest pain, unspecified: Secondary | ICD-10-CM | POA: Diagnosis present

## 2022-09-27 DIAGNOSIS — I2510 Atherosclerotic heart disease of native coronary artery without angina pectoris: Secondary | ICD-10-CM | POA: Diagnosis not present

## 2022-09-27 DIAGNOSIS — I10 Essential (primary) hypertension: Secondary | ICD-10-CM | POA: Diagnosis present

## 2022-09-27 DIAGNOSIS — I493 Ventricular premature depolarization: Secondary | ICD-10-CM | POA: Insufficient documentation

## 2022-09-27 MED ORDER — NITROGLYCERIN 0.4 MG SL SUBL
0.8000 mg | SUBLINGUAL_TABLET | Freq: Once | SUBLINGUAL | Status: AC
  Administered 2022-09-27: 0.8 mg via SUBLINGUAL

## 2022-09-27 MED ORDER — IOHEXOL 350 MG/ML SOLN
95.0000 mL | Freq: Once | INTRAVENOUS | Status: AC | PRN
  Administered 2022-09-27: 95 mL via INTRAVENOUS

## 2022-09-27 MED ORDER — NITROGLYCERIN 0.4 MG SL SUBL
SUBLINGUAL_TABLET | SUBLINGUAL | Status: AC
  Filled 2022-09-27: qty 2

## 2022-09-28 ENCOUNTER — Telehealth (HOSPITAL_BASED_OUTPATIENT_CLINIC_OR_DEPARTMENT_OTHER): Payer: Self-pay

## 2022-09-28 DIAGNOSIS — E7800 Pure hypercholesterolemia, unspecified: Secondary | ICD-10-CM

## 2022-09-28 MED ORDER — ATORVASTATIN CALCIUM 40 MG PO TABS: 40.0000 mg | ORAL_TABLET | Freq: Every day | ORAL | 3 refills | Status: DC

## 2022-09-28 MED ORDER — ASPIRIN 81 MG PO TBEC: 81.0000 mg | DELAYED_RELEASE_TABLET | Freq: Every day | ORAL | 3 refills | Status: DC

## 2022-09-28 NOTE — Telephone Encounter (Addendum)
Results called to patient who verbalizes understanding! Labs ordered and mailed, prescriptions updated and sent to pharmacy on file.     ----- Message from Loel Dubonnet, NP sent at 09/27/2022  9:25 PM EST ----- Cardiac CTA with mild nonobstructive coronary disease. Not enough to cause symptoms, but do recommend secondary prevention. Start Aspirin EC '81mg'$  daily. Most recent LDL (bad cholesterol) not at goal of <70. Recommend increase Atorvastatin to '40mg'$  daily with FLP/LFT in 2 months.

## 2022-11-05 ENCOUNTER — Encounter (HOSPITAL_BASED_OUTPATIENT_CLINIC_OR_DEPARTMENT_OTHER): Payer: Self-pay | Admitting: Family

## 2022-11-05 ENCOUNTER — Ambulatory Visit (INDEPENDENT_AMBULATORY_CARE_PROVIDER_SITE_OTHER): Payer: Medicare Other | Admitting: Family

## 2022-11-05 VITALS — BP 132/62 | HR 73 | Ht 62.0 in | Wt 130.0 lb

## 2022-11-05 DIAGNOSIS — I10 Essential (primary) hypertension: Secondary | ICD-10-CM

## 2022-11-05 DIAGNOSIS — I25118 Atherosclerotic heart disease of native coronary artery with other forms of angina pectoris: Secondary | ICD-10-CM | POA: Diagnosis not present

## 2022-11-05 DIAGNOSIS — E7800 Pure hypercholesterolemia, unspecified: Secondary | ICD-10-CM

## 2022-11-05 DIAGNOSIS — E785 Hyperlipidemia, unspecified: Secondary | ICD-10-CM | POA: Diagnosis not present

## 2022-11-05 MED ORDER — ATORVASTATIN CALCIUM 20 MG PO TABS: 20.0000 mg | ORAL_TABLET | Freq: Every day | ORAL | 3 refills | Status: DC

## 2022-11-05 MED ORDER — EZETIMIBE 10 MG PO TABS: 10.0000 mg | ORAL_TABLET | Freq: Every day | ORAL | 3 refills | Status: DC

## 2022-11-05 NOTE — Patient Instructions (Addendum)
Medication Instructions:  Your physician has recommended you make the following change in your medication:   REDUCE Atorvastatin to '20mg'$  daily  START Ezetimibe (Zetia) '10mg'$  daily   *If you need a refill on your cardiac medications before your next appointment, please call your pharmacy*   Lab Work/Testing Procedures: Your CT scan showed a small hiatal hernia. This is not concerning. It can sometimes cause some indigestion or shortness of breath. If you are very bothered by symptoms, would recommend discussing addition of a medicine called PPI such as Protonix.   Your CT scan showed a small amount of plaque in your heart. It is enough to focus on cholesterol but not enough to cause chest pain.  Your physician recommends that you return for lab work in 2-3 months to check cholesterol.   Please return for Lab work. You may come to the...   [facility] (3rd floor) [address], [place], [place] [address]  Open: 8am-Noon and 1pm-4:30pm  Please ring the doorbell on the small table when you exit the elevator and the Lab Tech will come get you  [place] [facility] [address] [place], [place], [place] [address] Open: 8am-1pm, then 2pm-4:30pm   [place]- Please see attached locations sheet stapled to your lab work with address and hours.    Follow-Up: At [facility], you and your health needs are our priority.  As part of our continuing mission to provide you with exceptional heart care, we have created designated Provider Care Teams.  These Care Teams include your primary Cardiologist (physician) and Advanced Practice Providers (APPs -  Physician Assistants and Nurse Practitioners) who all work together to provide you with the care you need, when you need it.  We recommend signing up for the patient portal called "MyChart".  Sign up information is provided on this After Visit Summary.  MyChart is used to connect with patients for  Virtual Visits (Telemedicine).  Patients are able to view lab/test results, encounter notes, upcoming appointments, etc.  Non-urgent messages can be sent to your provider as well.   To learn more about what you can do with MyChart, go to [email or web address].    Your next appointment:   6 month(s)  The format for your next appointment:   In Person  Provider:   Skeet Latch, MD    Other Instructions  For coronary artery disease often called "heart disease" we aim for optimal guideline directed medical therapy. We use the "A, B, C"s to help keep [place] on track!  A = Aspirin '81mg'$  daily B = Blood pressure control. C = Cholesterol control. You take Atorvastatin and Zetia to help control your cholesterol.  ________________________________  MyChart login  MyChart username: [place] password: Welcome  ________________________________   Hiatal Hernia  A hiatal hernia occurs when part of the stomach slides above the muscle that separates the abdomen from the chest (diaphragm). A person can be born with a hiatal hernia (congenital), or it may develop over time. In almost all cases of hiatal hernia, only the top part of the stomach pushes through the diaphragm. Many people have a hiatal hernia with no symptoms. The larger the hernia, the more likely it is that you will have symptoms. In some cases, a hiatal hernia allows stomach acid to flow back into the tube that carries food from your mouth to your stomach (esophagus). This may cause heartburn symptoms. The development of heartburn symptoms may mean that you have  a condition called gastroesophageal reflux disease (GERD). What are the causes? This condition is caused by a weakness in the opening (hiatus) where the esophagus passes through the diaphragm to attach to the upper part of the stomach. A person may be born with a weakness in the hiatus, or a weakness can develop over time. What increases the risk? This condition  is more likely to develop in: Older people. Age is a major risk factor for a hiatal hernia, especially if you are over the age of 65. Pregnant women. People who are overweight. People who have frequent constipation. What are the signs or symptoms? Symptoms of this condition usually develop in the form of GERD symptoms. Symptoms include: Heartburn. Upset stomach (indigestion). Trouble swallowing. Coughing or wheezing. Wheezing is making high-pitched whistling sounds when you breathe. Sore throat. Chest pain. Nausea and vomiting. How is this diagnosed? This condition may be diagnosed during testing for GERD. Tests that may be done include: X-rays of your stomach or chest. An upper gastrointestinal (GI) series. This is an X-ray exam of your GI tract that is taken after you swallow a chalky liquid that shows up clearly on the X-ray. Endoscopy. This is a procedure to look into your stomach using a thin, flexible tube that has a tiny camera and light on the end of it. How is this treated? This condition may be treated by: Dietary and lifestyle changes to help reduce GERD symptoms. Medicines. These may include: Over-the-counter antacids. Medicines that make your stomach empty more quickly. Medicines that block the production of stomach acid (H2 blockers). Stronger medicines to reduce stomach acid (proton pump inhibitors). Surgery to repair the hernia, if other treatments are not helping. If you have no symptoms, you may not need treatment. Follow these instructions at home: Lifestyle and activity Do not use any products that contain nicotine or tobacco. These products include cigarettes, chewing tobacco, and vaping devices, such as e-cigarettes. If you need help quitting, ask your health care provider. Try to achieve and maintain a healthy body weight. Avoid putting pressure on your abdomen. Anything that puts pressure on your abdomen increases the amount of acid that may be pushed up into  your esophagus. Avoid bending over, especially after eating. Raise the head of your bed by putting blocks under the legs. This keeps your head and esophagus higher than your stomach. Do not wear tight clothing around your chest or stomach. Try not to strain when having a bowel movement, when urinating, or when lifting heavy objects. Eating and drinking Avoid foods that can worsen GERD symptoms. These may include: Fatty foods, like fried foods. Citrus fruits, like oranges or lemon. Other foods and drinks that contain acid, like orange juice or tomatoes. Spicy food. Chocolate. Eat frequent small meals instead of three large meals a day. This helps prevent your stomach from getting too full. Eat slowly. Do not lie down right after eating. Do not eat 1-2 hours before bed. Do not drink beverages with caffeine. These include cola, coffee, cocoa, and tea. Do not drink alcohol. General instructions Take over-the-counter and prescription medicines only as told by your health care provider. Keep all follow-up visits. Your health care provider will want to check that any new prescribed medicines are helping your symptoms. Contact a health care provider if: Your symptoms are not controlled with medicines or lifestyle changes. You are having trouble swallowing. You have coughing or wheezing that will not go away. Your pain is getting worse. Your pain spreads to your  arms, neck, jaw, teeth, or back. You feel nauseous or you vomit. Get help right away if: You have shortness of breath. You vomit blood. You have bright red blood in your stools. You have black, tarry stools. These symptoms may be an emergency. Get help right away. Call 911. Do not wait to see if the symptoms will go away. Do not drive yourself to the hospital. Summary A hiatal hernia occurs when part of the stomach slides above the muscle that separates the abdomen from the chest. A person may be born with a weakness in the  hiatus, or a weakness can develop over time. Symptoms of a hiatal hernia may include heartburn, trouble swallowing, or sore throat. Management of a hiatal hernia includes eating frequent small meals instead of three large meals a day. Get help right away if you vomit blood, have bright red blood in your stools, or have black, tarry stools. This information is not intended to replace advice given to you by your health care provider. Make sure you discuss any questions you have with your health care provider. Document Revised: 01/02/2022 Document Reviewed: 01/02/2022 Elsevier Patient Education  [place].

## 2022-11-05 NOTE — Progress Notes (Unsigned)
Office Visit   Patient Name: Michelle Robertson Date of Encounter: 11/06/2022  PCP:  Sonia Side[facility]  Cardiologist:  Skeet Latch, MD  Advanced Practice Provider:  No care team member to display Electrophysiologist:  None      Chief Complaint    Michelle Robertson is a 71 y.o. female presents today for follow up after cardiac CTA.   Past Medical History    Past Medical History:  Diagnosis Date   Anxiety    Arthritis    Carotid stenosis, asymptomatic, bilateral 12/2017   last ultrasound in epic 01-01-2018  bilateral ICA 40-59%   Chest pain of uncertain etiology 96/28/3662   Essential hypertension    Fall 11/06/2021   See ED provider note in Epic dated 11/09/21. Patient fell and hit her head causing a headache and scalp hematoma. 11/09/21 CT Head   & cervical spine normal (see Epic.)   Full dentures    GERD (gastroesophageal reflux disease)    Heart palpitations 2017   cardiology --- dr t. Oval Linsey;  (10-18-2021  per intermittant palpitations) event monitor 03-20-2016 SR/ SB w/ occasional PAC and 12 lead ekg showed PVC   Hiatal hernia    History of chest pain    followed by cardiology-- dr t. Oval Linsey;  (10-18-2021  oer pt no chest pain since 09-09-2021 & cardiology 09-14-2021 note in epic) CCT -- 12/ 2013 no coronary artery calcification/ atheroscloersis;  02/ 2019 nulcear stresst test, normal perfusion no ischemia, nuclear ef 55%;  02/ 2019  carotid US, bilateral ICA 40-59% stenosis;  last echo 11/ 2022 ef 60-65%, G1DD, trivial MR;  CTA 10/ 2022 normal   History of COVID-19 06/2021   per pt mild symptoms that resolved   History of ectopic pregnancy 1991   s/p surgery   History of panic attacks    IDA (iron deficiency anemia)    OSA (obstructive sleep apnea)    10-18-2021  per pt had study 2 months ago, waiting on cpap due to being on backorder   PAC (premature atrial contraction) 2017   occasional per event monitor 05/ 2017    Posterior vaginal wall prolapse    Presence of pessary    SOB (shortness of breath) on exertion    per pt with stairs, house chores without sob, and long distance walking with occasional sob   Type 2 diabetes mellitus ([place])    followed by pcp   (10-18-2021  pt stated check blood sugar 1-2 times daily,  fasting sugar-- 90--121)   Wears glasses    Past Surgical History:  Procedure Laterality Date   ANTERIOR AND POSTERIOR REPAIR WITH SACROSPINOUS FIXATION N/A 03/02/2022   Procedure: ANTERIOR REPAIR WITH SACROSPINOUS LIGAMENT FIXATION;  Surgeon: [name], MD;  Location: [facility];  Service: Gynecology;  Laterality: N/A;   CYSTOSCOPY N/A 03/02/2022   Procedure: CYSTOSCOPY;  Surgeon: [name], MD;  Location: [facility];  Service: Gynecology;  Laterality: N/A;   ECTOPIC PREGNANCY SURGERY  1991   EXPLORATORY LAPAROTOMY     for bowel obstruction   RECTOCELE REPAIR N/A 10/23/2021   Procedure: POSTERIOR REPAIR (RECTOCELE);  Surgeon: [name], MD;  Location: [facility];  Service: Gynecology;  Laterality: N/A;   ROBOTIC ASSISTED LAPAROSCOPIC LYSIS OF ADHESION N/A 03/02/2022   Procedure: XI ROBOTIC ASSISTED laparoscopic lysis of adhesions;  Surgeon: [name], MD;  Location: [facility];  Service:  Gynecology;  Laterality: N/A;   TUBAL LIGATION Bilateral    yrs ago    Allergies  Allergies  Allergen Reactions   Pork-Derived Products     History of Present Illness    Michelle Robertson is a 71 y.o. female with a hx of diabetes mellitus type 2, PAC, PVC, hyperlipidemia, hiatal hernia, nonobstructive coronary artery disease last seen 09/13/2022.  Prior echo 2014 LVEF 65 to 70%, otherwise unremarkable.  ED visit 01/2016 with intermittent palpitations.  PVCs noted by EKG.  7-day event monitor with occasional PAC.  Improvement in symptoms with metoprolol.  Later increased to lipid blood pressure.   Her husband passed away 07-23-2020 and she went home to [place] for a year and noted episodes of high blood pressure and palpitations in the setting of stress.  ED visit 10/22 for chest pain.  Cardiac enzymes negative but D-dimer mildly elevated.  Chest CT negative for PE.  Calcification of aorta but no coronary calcification.  Echo 09/2021 LVEF 60 to 24%, grade 1 diastolic dysfunction.  ED visit 08/2021 for atypical chest pain.  Coronary CTA 09/27/2022 with coronary calcium score of 1.68 with 25 to 29% stenosis in the left circumflex as well as LAD.  Overall mild nonobstructive.  Radiologist read notes small hiatal hernia.  She presents today for follow up. PCP did a lot of blood work for generalized pain. Particularly in her right back. It did get better with gabapenitn.  Her atorvastatin was recently increased we discussed that this may be contributory.  Mild exertional dyspnea which is overall stable compared to previous.  No orthopnea, PND. No edema.  Palpitations have been overall quiescent over the last couple of years. Checking BP and heart rate at home. NOtes her blood pressure will escalate with certain foods. BP at home is "good" which she reports 137/73. She just took her medications prior to clinic visit.  We reviewed BP goal less than 130/80.  EKGs/Labs/Other Studies Reviewed:   The following studies were reviewed today: Cardiac CTA 09/27/2022 FINDINGS: Image quality: Poor.   Noise artifact is: Severe. (mis-registration).   Coronary Arteries:  Normal coronary origin.  Left dominance.   Left main: The left main is a large caliber vessel with a normal take off from the left coronary cusp that bifurcates to form a left anterior descending artery and a left circumflex artery. There is no plaque or stenosis.   Left anterior descending artery: The LAD has mild (25-49) stenosis in the proximal vessel followed by mild (25-49) stenosis in the mid vessel. The LAD gives off 2 patent diagonal  branches. LAD is large vessel that wraps the apex.   Left circumflex artery: The LCX is dominant and patent with mild stenosis (25-49)) int the proximal vessel. The LCX gives off 2 patent obtuse marginal branches and terminates in PDA and posterolateral. There is mild (25-49) stenosis in the ostium of OM1.   Right coronary artery: The RCA is non-dominant with normal take off from the right coronary cusp. There is no evidence of plaque or stenosis. The RCA gives off large RV branches.   Right Atrium: Right atrial size is within normal limits.   Right Ventricle: The right ventricular cavity is within normal limits.   Left Atrium: Left atrial size is normal in size with no left atrial appendage filling defect.   Left Ventricle: The ventricular cavity size is within normal limits. There are no stigmata of prior infarction. There is no abnormal filling defect.   Pulmonary arteries: Normal  in size without proximal filling defect.   Pulmonary veins: Normal pulmonary venous drainage.   Pericardium: Normal thickness with no significant effusion or calcium present.   Cardiac valves: The aortic valve is trileaflet without significant calcification. The mitral valve is normal structure without significant calcification.   Aorta: Normal caliber with no significant disease.   Extra-cardiac findings: See attached radiology report for non-cardiac structures.   IMPRESSION: 1. Coronary calcium score of 1.68. This was 64 percentile for age-, sex, and race-matched controls.   2. Normal coronary origin with left dominance.   3. Mild CAD.   4. Poor quality study with significant misregistration.   RECOMMENDATIONS: CAD-RADS 2: Mild non-obstructive CAD (25-49%).  EKG:  EKG is not ordered today.    Recent Labs: 09/07/2022: Hemoglobin 10.1; Platelets 296 09/18/2022: ALT 12; BUN 16; Creatinine, Ser 1.21; Potassium 4.6; Sodium 141  Recent Lipid Panel    Component Value Date/Time    CHOL 167 09/18/2022 0905   TRIG 102 09/18/2022 0905   HDL 49 09/18/2022 0905   CHOLHDL 3.4 09/18/2022 0905   CHOLHDL 5.6 (H) 08/01/2018 1400   VLDL 20 09/13/2016 0817   LDLCALC 99 09/18/2022 0905   LDLCALC 168 (H) 08/01/2018 1400   LDLDIRECT 134.0 07/19/2015 1541    Home Medications   Current Meds  Medication Sig   aspirin EC 81 MG tablet Take 1 tablet (81 mg total) by mouth daily. Swallow whole.   Blood Glucose Monitoring Suppl (ONE TOUCH ULTRA 2) w/Device KIT Use to test blood sugar three times daily. Dx: E11.9   CHLOROPHYLL PO Take 15 mLs by mouth daily. Vegan liquid   Cholecalciferol (VITAMIN D) 50 MCG (2000 UT) CAPS Take 1 capsule by mouth daily.   clonazePAM (KLONOPIN) 0.5 MG tablet Take 1 tablet (0.5 mg total) by mouth 2 (two) times daily as needed. for anxiety (Patient taking differently: Take 0.5 mg by mouth 2 (two) times daily as needed. for anxiety)   ezetimibe (ZETIA) 10 MG tablet Take 1 tablet (10 mg total) by mouth daily.   ferrous sulfate 325 (65 FE) MG EC tablet Take 325 mg by mouth in the morning and at bedtime.   glucose blood (ONE TOUCH ULTRA TEST) test strip Use to test blood sugar three times daily. Dx:E11.9   Lancets (ONETOUCH ULTRASOFT) lancets Use to check blood sugar three times daily. Dx:E11.9   lisinopril (ZESTRIL) 10 MG tablet Take 1 tablet (10 mg total) by mouth daily.   metFORMIN (GLUCOPHAGE) 500 MG tablet TAKE 1 TABLET BY MOUTH TWICE DAILY WITH A MEAL for diabetes (Patient taking differently: Take 500 mg by mouth 2 (two) times daily with a meal. TAKE 1 TABLET BY MOUTH TWICE DAILY WITH A MEAL for diabetes)   Polyethyl Glycol-Propyl Glycol (SYSTANE FREE OP) Apply to eye as needed.   polyethylene glycol powder (GLYCOLAX/MIRALAX) 17 GM/SCOOP powder Take 17 g by mouth daily. Drink 17g (1 scoop) dissolved in water per day.   [DISCONTINUED] atorvastatin (LIPITOR) 40 MG tablet Take 1 tablet (40 mg total) by mouth daily.   [DISCONTINUED] metoprolol tartrate  (LOPRESSOR) 50 MG tablet Take 1 tablet 2 hours prior to ct    Review of Systems      All other systems reviewed and are otherwise negative except as noted above.  Physical Exam    VS:  BP 132/62   Pulse 73   Ht _0  (1.575 m)   Wt 130 lb (59 kg)   BMI 23.78 kg/m  , BMI Body mass  index is 23.78 kg/m.  Wt Readings from Last 3 Encounters:  11/05/22 130 lb (59 kg)  09/13/22 132 lb 8 oz (60.1 kg)  09/07/22 130 lb (59 kg)     GEN: Well nourished, well developed, in no acute distress. HEENT: normal. Neck: Supple, no JVD, carotid bruits, or masses. Cardiac: RRR, no murmurs, rubs, or gallops. No clubbing, cyanosis, edema.  Radials/PT 2+ and equal bilaterally.  Respiratory:  Respirations regular and unlabored, clear to auscultation bilaterally. GI: Soft, nontender, nondistended. MS: No deformity or atrophy. Skin: Warm and dry, no rash. Neuro:  Strength and sensation are intact. Psych: Normal affect.  Assessment & Plan    Nonobstructive coronary artery disease-nonobstructive disease by CT 09/2022.  No recurrent chest pain.  Reassurance provided regarding her previous symptoms.  GDMT includes aspirin, atorvastatin, Zetia, metoprolol. Heart healthy diet and regular cardiovascular exercise encouraged.    HTN -initial BP 158/62 with repeat without intervention 132/62.  Continue present antihypertensive regimen. Discussed to monitor BP at home at least 2 hours after medications and sitting for 5-10 minutes.  She will contact office BP at home consistently greater than 130/80.  If noted could consider further increasing her dose of lisinopril.  Hyperlipidemia, LDL goal less than 70-Notes increased myalgias since increasing dose of atorvastatin.  Statin from 40 mg to 20 mg daily and start Zetia 10 mg daily.  Fasting lipid panel, CMP in 2 to 3 months.  PACs-overall quiescent on present dose metoprolol  Small hiatal hernia-indeterminant finding by CT 09/2022.  Discussed possible addition of  PPI but she prefers to continue to monitor for symptoms.  Notes occasional indigestion we reviewed GERD precautions such as avoiding acidic foods.         Disposition: Follow up in 6 month(s) with Skeet Latch, MD or APP.  Signed, Loel Dubonnet, NP 11/06/2022, 4:44 PM [place]

## 2022-11-06 ENCOUNTER — Encounter (HOSPITAL_BASED_OUTPATIENT_CLINIC_OR_DEPARTMENT_OTHER): Payer: Self-pay | Admitting: Family

## 2022-11-08 ENCOUNTER — Other Ambulatory Visit: Payer: Self-pay | Admitting: Family

## 2022-11-08 DIAGNOSIS — Z1231 Encounter for screening mammogram for malignant neoplasm of breast: Secondary | ICD-10-CM

## 2022-11-22 ENCOUNTER — Other Ambulatory Visit: Payer: Self-pay | Admitting: Family

## 2022-11-22 DIAGNOSIS — N644 Mastodynia: Secondary | ICD-10-CM

## 2022-12-06 ENCOUNTER — Other Ambulatory Visit: Payer: Self-pay | Admitting: Family

## 2022-12-06 DIAGNOSIS — N644 Mastodynia: Secondary | ICD-10-CM

## 2022-12-25 ENCOUNTER — Ambulatory Visit
Admission: RE | Admit: 2022-12-25 | Discharge: 2022-12-25 | Disposition: A | Discharge status: Home / Self Care | Payer: 59 | Source: Ambulatory Visit | Attending: Family | Admitting: Family

## 2022-12-25 ENCOUNTER — Ambulatory Visit: Payer: Medicare Other

## 2022-12-25 ENCOUNTER — Ambulatory Visit
Admission: RE | Admit: 2022-12-25 | Discharge: 2022-12-25 | Disposition: A | Discharge status: Home / Self Care | Payer: Medicare Other | Source: Ambulatory Visit | Attending: Family | Admitting: Family

## 2022-12-25 DIAGNOSIS — N644 Mastodynia: Secondary | ICD-10-CM

## 2023-04-23 ENCOUNTER — Ambulatory Visit (HOSPITAL_BASED_OUTPATIENT_CLINIC_OR_DEPARTMENT_OTHER): Payer: 59 | Admitting: Cardiovascular Disease

## 2023-09-20 ENCOUNTER — Other Ambulatory Visit (HOSPITAL_BASED_OUTPATIENT_CLINIC_OR_DEPARTMENT_OTHER): Payer: Self-pay | Admitting: Cardiovascular Disease

## 2023-11-03 ENCOUNTER — Other Ambulatory Visit (HOSPITAL_BASED_OUTPATIENT_CLINIC_OR_DEPARTMENT_OTHER): Payer: Self-pay | Admitting: Family

## 2023-11-03 DIAGNOSIS — E7800 Pure hypercholesterolemia, unspecified: Secondary | ICD-10-CM

## 2023-11-24 ENCOUNTER — Other Ambulatory Visit (HOSPITAL_BASED_OUTPATIENT_CLINIC_OR_DEPARTMENT_OTHER): Payer: Self-pay | Admitting: Family

## 2023-11-24 DIAGNOSIS — E7800 Pure hypercholesterolemia, unspecified: Secondary | ICD-10-CM

## 2023-11-24 DIAGNOSIS — E785 Hyperlipidemia, unspecified: Secondary | ICD-10-CM

## 2023-11-29 ENCOUNTER — Ambulatory Visit
Admission: RE | Admit: 2023-11-29 | Discharge: 2023-11-29 | Disposition: A | Discharge status: Home / Self Care | Payer: 59 | Source: Ambulatory Visit | Attending: Registered Nurse

## 2023-11-29 ENCOUNTER — Other Ambulatory Visit: Payer: Self-pay | Admitting: Registered Nurse

## 2023-11-29 DIAGNOSIS — J069 Acute upper respiratory infection, unspecified: Secondary | ICD-10-CM

## 2024-01-07 ENCOUNTER — Ambulatory Visit: Payer: 59 | Admitting: Adult Health

## 2024-02-21 ENCOUNTER — Other Ambulatory Visit (HOSPITAL_BASED_OUTPATIENT_CLINIC_OR_DEPARTMENT_OTHER): Payer: Self-pay | Admitting: Family

## 2024-02-21 DIAGNOSIS — E785 Hyperlipidemia, unspecified: Secondary | ICD-10-CM

## 2024-02-21 DIAGNOSIS — E7800 Pure hypercholesterolemia, unspecified: Secondary | ICD-10-CM

## 2024-03-02 ENCOUNTER — Emergency Department: Admission: EM | Admit: 2024-03-02 | Discharge: 2024-03-03 | Disposition: A | Discharge status: Home / Self Care

## 2024-03-02 ENCOUNTER — Other Ambulatory Visit: Payer: Self-pay

## 2024-03-02 ENCOUNTER — Emergency Department

## 2024-03-02 DIAGNOSIS — R42 Dizziness and giddiness: Secondary | ICD-10-CM | POA: Diagnosis present

## 2024-03-02 DIAGNOSIS — H6691 Otitis media, unspecified, right ear: Secondary | ICD-10-CM | POA: Insufficient documentation

## 2024-03-02 LAB — CBC
HCT: 32 % — ABNORMAL LOW (ref 36.0–46.0)
Hemoglobin: 9.6 g/dL — ABNORMAL LOW (ref 12.0–15.0)
MCH: 28.2 pg (ref 26.0–34.0)
MCHC: 30 g/dL (ref 30.0–36.0)
MCV: 94.1 fL (ref 80.0–100.0)
Platelets: 141 10*3/uL — ABNORMAL LOW (ref 150–400)
RBC: 3.4 MIL/uL — ABNORMAL LOW (ref 3.87–5.11)
RDW: 13.6 % (ref 11.5–15.5)
WBC: 6.6 10*3/uL (ref 4.0–10.5)
nRBC: 0 % (ref 0.0–0.2)

## 2024-03-02 LAB — BASIC METABOLIC PANEL WITH GFR
Anion gap: 10 (ref 5–15)
BUN: 22 mg/dL (ref 8–23)
CO2: 21 mmol/L — ABNORMAL LOW (ref 22–32)
Calcium: 9.5 mg/dL (ref 8.9–10.3)
Chloride: 104 mmol/L (ref 98–111)
Creatinine, Ser: 1.04 mg/dL — ABNORMAL HIGH (ref 0.44–1.00)
GFR, Estimated: 58 mL/min — ABNORMAL LOW (ref >60)
Glucose, Bld: 178 mg/dL — ABNORMAL HIGH (ref 70–99)
Potassium: 4.3 mmol/L (ref 3.5–5.1)
Sodium: 135 mmol/L (ref 135–145)

## 2024-03-02 LAB — CBG MONITORING, ED: Glucose-Capillary: 144 mg/dL — ABNORMAL HIGH (ref 70–99)

## 2024-03-02 LAB — TROPONIN I (HIGH SENSITIVITY): Troponin I (High Sensitivity): 3 ng/L (ref <18)

## 2024-03-02 MED ORDER — AMOXICILLIN-POT CLAVULANATE 875-125 MG PO TABS
1.0000 | ORAL_TABLET | Freq: Once | ORAL | Status: AC
Start: 2024-03-02 — End: 2024-03-02
  Administered 2024-03-02: 1 via ORAL
  Filled 2024-03-02: qty 1

## 2024-03-02 MED ORDER — SODIUM CHLORIDE 0.9 % IV BOLUS: 500.0000 mL | Freq: Once | INTRAVENOUS | Status: DC

## 2024-03-02 MED ORDER — MECLIZINE HCL 25 MG PO TABS
25.0000 mg | ORAL_TABLET | Freq: Once | ORAL | Status: AC
Start: 2024-03-02 — End: 2024-03-02
  Administered 2024-03-02: 25 mg via ORAL
  Filled 2024-03-02: qty 1

## 2024-03-02 MED ORDER — ONDANSETRON 4 MG PO TBDP: 4.0000 mg | ORAL_TABLET | Freq: Three times a day (TID) | ORAL | 0 refills | Status: AC | PRN

## 2024-03-02 MED ORDER — MECLIZINE HCL 12.5 MG PO TABS: 12.5000 mg | ORAL_TABLET | Freq: Three times a day (TID) | ORAL | 0 refills | Status: AC | PRN

## 2024-03-02 MED ORDER — AMOXICILLIN-POT CLAVULANATE 875-125 MG PO TABS: 1.0000 | ORAL_TABLET | Freq: Two times a day (BID) | ORAL | 0 refills | Status: AC

## 2024-03-02 MED ORDER — ONDANSETRON HCL 4 MG/2ML IJ SOLN
4.0000 mg | Freq: Once | INTRAMUSCULAR | Status: DC
  Filled 2024-03-02: qty 2

## 2024-03-02 NOTE — ED Provider Notes (Signed)
 [facility] Provider Note    Event Date/Time   First MD Initiated Contact with Patient 03/02/24 2127     (approximate)   History   Dizziness  Pt presents to ED from home C/O nausea X 1 month, dizziness that began Saturday. Pt denies weakness/numbness, speech changes, vision changes.    HPI Michelle Robertson is a 73 y.o. female PMH after meals, PACs/PVCs, diabetes, history of panic attacks, carotid stenosis presents for evaluation of dizziness - Patient has noticed some mild dizziness and vertigo throughout the month though notes that it is significantly worse today.  Had been intermittent but is now constant. -Notes right ear ache, no preceding trauma, no discharge, no fever -No headache -No significant vision change -Remote history of vertigo, had improved with meclizine previously.  Told her doctor about her recent dizziness, tried meclizine, minimal relief. -No urinary symptoms, no URI symptoms     Physical Exam   Triage Vital Signs: ED Triage Vitals  Encounter Vitals Group     BP 03/02/24 1902 (!) 150/80     Systolic BP Percentile --      Diastolic BP Percentile --      Pulse Rate 03/02/24 1902 73     Resp 03/02/24 1902 16     Temp 03/02/24 1902 97.9 F (36.6 C)     Temp Source 03/02/24 1902 Oral     SpO2 03/02/24 1902 99 %     Weight 03/02/24 1900 130 lb (59 kg)     Height 03/02/24 1900 5\' 2"  (1.575 m)     Head Circumference --      Peak Flow --      Pain Score 03/02/24 1900 0     Pain Loc --      Pain Education --      Exclude from Growth Chart --     Most recent vital signs: Vitals:   03/02/24 2341 03/03/24 0002  BP: (!) 144/86   Pulse: 67   Resp: 16   Temp: 97.7 F (36.5 C)   SpO2: 100% 100%     General: Awake, no distress.  HEENT: +R ear effusion, otherwise wnl CV:  Good peripheral perfusion. RRR, RP 2+ Resp:  Normal effort. CTAB Abd:  No distention. Nontender to deep palpation throughout Neuro:  Aox4, CN II-XII  intact, FNF wnl, finger taps fast b/l, 5/5 strength in bilateral finger extension/grip, EHL/FHL. BUE AG 10+ sec no drift, BLE AG 5+ sec no drift. Ambulates with steady gait. SILT. +dix-hallpike on R.    ED Results / Procedures / Treatments   Labs (all labs ordered are listed, but only abnormal results are displayed) Labs Reviewed  BASIC METABOLIC PANEL WITH GFR - Abnormal; Notable for the following components:      Result Value   CO2 21 (*)    Glucose, Bld 178 (*)    Creatinine, Ser 1.04 (*)    GFR, Estimated 58 (*)    All other components within normal limits  CBC - Abnormal; Notable for the following components:   RBC 3.40 (*)    Hemoglobin 9.6 (*)    HCT 32.0 (*)    Platelets 141 (*)    All other components within normal limits  CBG MONITORING, ED - Abnormal; Notable for the following components:   Glucose-Capillary 144 (*)    All other components within normal limits  URINALYSIS, ROUTINE W REFLEX MICROSCOPIC  TROPONIN I (HIGH SENSITIVITY)     EKG  See ED course  below.   RADIOLOGY CT head interpreted by myself with no acute pathology.  Formal radiology read reviewed.  PROCEDURES:  Critical Care performed: No  Procedures   MEDICATIONS ORDERED IN ED: Medications  sodium chloride 0.9 % bolus 500 mL (500 mLs Intravenous Not Given 03/03/24 0002)  ondansetron (ZOFRAN) injection 4 mg (4 mg Intravenous Not Given 03/03/24 0003)  meclizine (ANTIVERT) tablet 25 mg (25 mg Oral Given 03/02/24 2319)  amoxicillin-clavulanate (AUGMENTIN) 875-125 MG per tablet 1 tablet (1 tablet Oral Given 03/02/24 2340)     IMPRESSION / MDM / ASSESSMENT AND PLAN / ED COURSE  I reviewed the triage vital signs and the nursing notes.                              DDX/MDM/AP: Differential diagnosis includes, but is not limited to, subacute CVA, ACS, arrhythmia, high suspicion for peripheral etiology of vertigo including BPPV/labyrinthitis/Mnire's.  Do suspect patient has right otitis  media.  Plan: - Labs -EKG - CT head - Meclizine, Zofran, IV fluid - Reassess  Patient's presentation is most consistent with acute presentation with potential threat to life or bodily function.  The patient is on the cardiac monitor to evaluate for evidence of arrhythmia and/or significant heart rate changes.  ED course below.  Workup reassuring, CT head negative, laboratory results unremarkable.  Initial troponin normal, symptoms since this morning and no frank anginal symptoms, no indication for repeat.  No urinary symptoms, did not offer urine sample in emergency department but do not clinically suspect underlying UTI.  Presentation overall most consistent with peripheral vertigo with positive Dix-Hallpike on the right and right ear pain with right ear effusion, suspect otitis media.  Started on Augmentin, first dose given in emergency department.  Patient feeling much better on reevaluation, ambulatory with no ataxia and steady gait without assistance.  Feels comfortable going home with antibiotics, Zofran, meclizine.  Plan for PMD follow-up.  ED return precautions in place.  Patient family agree with plan.  Clinical Course as of 03/03/24 0013  Mon Mar 02, 2024  2132 CBC with no leukocytosis, overall stable anemia [MM]  2133 BMP reviewed, at baseline [MM]  2133 Ecg = sinus rhythm, rate 70, no ST elevation or depression, no significant repolarization abnormality, normal axis, normal intervals.  No evidence of ischemia nor arrhythmia on my read. [MM]  2342 Patient reevaluated, feeling much better, ambulates with steady gait, no ataxia.  Discussed concern for right otitis media and plan for antibiotics.  Suspect this is because of patient's dizziness, very consistent with peripheral etiology Rx meclizine, Zofran.  Plan for PMD follow-up.  ED return precautions in place.  Patient and daughter bedside agree with plan. [MM]    Clinical Course User Index [MM] Marinell Blight, MD     FINAL  CLINICAL IMPRESSION(S) / ED DIAGNOSES   Final diagnoses:  Right otitis media, unspecified otitis media type  Vertigo     Rx / DC Orders   ED Discharge Orders          Ordered    amoxicillin-clavulanate (AUGMENTIN) 875-125 MG tablet  2 times daily        03/02/24 2342    meclizine (ANTIVERT) 12.5 MG tablet  3 times daily PRN        03/02/24 2342    ondansetron (ZOFRAN-ODT) 4 MG disintegrating tablet  Every 8 hours PRN        03/02/24 2342  Note:  This document was prepared using Dragon voice recognition software and may include unintentional dictation errors.   Collis Deaner, MD 03/03/24 [phone]

## 2024-03-02 NOTE — ED Notes (Signed)
CBG 144 

## 2024-03-02 NOTE — Discharge Instructions (Addendum)
 Your evaluation in the emergency department was notable for what appears to be a right ear infection.  I suspect this is the cause of your dizziness, and I have prescribed you an antibiotic to treat it.  We also prescribed you a nausea medication as well as a dizziness medication.  Please do follow-up with your primary care doctor for reevaluation, and return to the emergency department with any new or worsening symptoms.

## 2024-03-02 NOTE — ED Notes (Signed)
 Family at bedside.

## 2024-03-02 NOTE — ED Triage Notes (Signed)
 Pt presents to ED from home C/O nausea X 1 month, dizziness that began Saturday. Pt denies weakness/numbness, speech changes, vision changes.

## 2024-03-02 NOTE — ED Notes (Signed)
 Pts complaining of fullness in right ear.

## 2024-03-10 ENCOUNTER — Ambulatory Visit: Payer: 59 | Admitting: Adult Health

## 2024-03-17 ENCOUNTER — Other Ambulatory Visit (HOSPITAL_BASED_OUTPATIENT_CLINIC_OR_DEPARTMENT_OTHER): Payer: Self-pay | Admitting: Family

## 2024-03-17 DIAGNOSIS — E785 Hyperlipidemia, unspecified: Secondary | ICD-10-CM

## 2024-03-17 DIAGNOSIS — E7800 Pure hypercholesterolemia, unspecified: Secondary | ICD-10-CM

## 2024-03-21 ENCOUNTER — Other Ambulatory Visit (HOSPITAL_BASED_OUTPATIENT_CLINIC_OR_DEPARTMENT_OTHER): Payer: Self-pay | Admitting: Family

## 2024-03-21 DIAGNOSIS — E785 Hyperlipidemia, unspecified: Secondary | ICD-10-CM

## 2024-03-21 DIAGNOSIS — E7800 Pure hypercholesterolemia, unspecified: Secondary | ICD-10-CM

## 2024-04-01 ENCOUNTER — Other Ambulatory Visit (HOSPITAL_BASED_OUTPATIENT_CLINIC_OR_DEPARTMENT_OTHER): Payer: Self-pay | Admitting: *Deleted

## 2024-04-01 DIAGNOSIS — E7800 Pure hypercholesterolemia, unspecified: Secondary | ICD-10-CM

## 2024-04-01 DIAGNOSIS — E785 Hyperlipidemia, unspecified: Secondary | ICD-10-CM

## 2024-04-01 MED ORDER — ATORVASTATIN CALCIUM 20 MG PO TABS: 20.0000 mg | ORAL_TABLET | Freq: Every day | ORAL | 0 refills | Status: AC

## 2024-04-07 ENCOUNTER — Encounter: Payer: Self-pay | Admitting: Obstetrics and Gynecology

## 2024-04-07 ENCOUNTER — Ambulatory Visit (INDEPENDENT_AMBULATORY_CARE_PROVIDER_SITE_OTHER): Admitting: Obstetrics and Gynecology

## 2024-04-07 VITALS — BP 147/62 | HR 78

## 2024-04-07 DIAGNOSIS — M62838 Other muscle spasm: Secondary | ICD-10-CM

## 2024-04-07 NOTE — Progress Notes (Signed)
 [facility] Return Visit  SUBJECTIVE  History of Present Illness: Michelle Robertson is a 73 y.o. female seen in follow-up for prolapse.   Two weeks ago, she has noticed some heaviness in the pelvic area. The sensation lasted a few days. She associated it with lifting her 6yo granddaughter. She is not sure if the prolapse returned but she has not felt anything since that time. Denies issues with her bladder, dysuria or constipation. No vaginal bleeding.   Past Medical History: Patient  has a past medical history of Anxiety, Arthritis, Carotid stenosis, asymptomatic, bilateral (12/2017), Chest pain of uncertain etiology (09/13/2022), Essential hypertension, Fall (11/06/2021), Full dentures, GERD (gastroesophageal reflux disease), Heart palpitations (2017), Hiatal hernia, History of chest pain, History of COVID-19 (06/2021), History of ectopic pregnancy (1991), History of panic attacks, IDA (iron deficiency anemia), OSA (obstructive sleep apnea), PAC (premature atrial contraction) (2017), Posterior vaginal wall prolapse, Presence of pessary, SOB (shortness of breath) on exertion, Type 2 diabetes mellitus (HCC), Vertigo, and Wears glasses.   Past Surgical History: She  has a past surgical history that includes Tubal ligation (Bilateral); Ectopic pregnancy surgery (1991); Rectocele repair (N/A, 10/23/2021); Exploratory laparotomy; Robotic assisted laparoscopic lysis of adhesion (N/A, 03/02/2022); Cystoscopy (N/A, 03/02/2022); Anterior and posterior repair with sacrospinous fixation (N/A, 03/02/2022); Bunionectomy; and Toe Surgery.   Medications: She has a current medication list which includes the following prescription(s): atorvastatin , one touch ultra 2, chlorophyll, vitamin d, clonazepam , glucose blood, onetouch ultrasoft, lisinopril , meclizine , metformin , ondansetron , polyethyl glycol-propyl glycol, and polyethylene glycol powder.   Allergies: Patient is allergic to pork-derived  products.   Social History: Patient  reports that she has never smoked. She has never used smokeless tobacco. She reports that she does not drink alcohol and does not use drugs.     OBJECTIVE     Physical Exam: Vitals:   04/07/24 0830 04/07/24 0852  BP: (!) 154/77 (!) 147/62  Pulse: 78 78   Gen: No apparent distress, A&O x 3.  Detailed Urogynecologic Evaluation:  Normal external genitalia. On speculum, normal vaginal mucosa and normal appearing cervix. On bimanual, uterus is small, mobile and nontender. Tenderness on palpation of pelvic floor muscles bilaterally.   POP-Q  -3                                            Aa   -3                                           Ba  -7.5                                              C   3                                            Gh  4  Pb  8                                            tvl   -3                                            Ap  -3                                            Bp  -8                                              D      ASSESSMENT AND PLAN    Ms. Shands is a 73 y.o. with:  1. Levator spasm     - No prolapse recurrence noted on today's exam.  - Suspect heaviness related to pelvic floor muscle spasm. She is not currently symptomatic in the last week. We discussed if symptoms recur, would recommend pelvic physical therapy. She declines placing the referral today but will let us  know if she wants it at a later time. Provided with handout on pelvic floor exercises.   Return as needed  Arma Lamp, MD  Time spent: I spent 25 minutes dedicated to the care of this patient on the date of this encounter to include pre-visit review of records, face-to-face time with the patient and post visit documentation.

## 2024-05-13 ENCOUNTER — Ambulatory Visit: Admitting: Obstetrics and Gynecology

## 2024-06-25 ENCOUNTER — Other Ambulatory Visit (HOSPITAL_BASED_OUTPATIENT_CLINIC_OR_DEPARTMENT_OTHER): Payer: Self-pay | Admitting: Family

## 2024-06-25 DIAGNOSIS — E785 Hyperlipidemia, unspecified: Secondary | ICD-10-CM

## 2024-06-25 DIAGNOSIS — E7800 Pure hypercholesterolemia, unspecified: Secondary | ICD-10-CM

## 2024-07-08 ENCOUNTER — Other Ambulatory Visit (HOSPITAL_BASED_OUTPATIENT_CLINIC_OR_DEPARTMENT_OTHER): Payer: Self-pay | Admitting: Registered Nurse

## 2024-07-08 DIAGNOSIS — E2839 Other primary ovarian failure: Secondary | ICD-10-CM

## 2024-08-06 ENCOUNTER — Ambulatory Visit (HOSPITAL_BASED_OUTPATIENT_CLINIC_OR_DEPARTMENT_OTHER)
Admission: RE | Admit: 2024-08-06 | Discharge: 2024-08-06 | Disposition: A | Discharge status: Home / Self Care | Source: Ambulatory Visit | Attending: Registered Nurse | Admitting: Registered Nurse

## 2024-08-06 DIAGNOSIS — E2839 Other primary ovarian failure: Secondary | ICD-10-CM | POA: Diagnosis present

## 2024-10-20 ENCOUNTER — Encounter: Payer: Self-pay | Admitting: Gastroenterology

## 2024-10-20 NOTE — Progress Notes (Signed)
 Review of outside records that were in my office  10/25 [facility] GI clinic note for evaluation of iron deficiency and GERD leading to EGD/colonoscopy.  Pathology Duodenum small bowel mucosa with no significant pathologic changes Stomach showed active H. pylori gastritis. Large intestine cecum biopsy tubulovillous adenoma  Colonoscopy from 09/29/2024 Perianal and digital rectal exams were normal. Internal hemorrhoids found during retroflexion.  Hemorrhoids were large. A 15 mm polypoid lesion was found in the cecum.  The lesion was multilobulated.  No bleeding was present.  Biopsies were taken.  Rule out malignancy.  Based on the pictures that I have reviewed and the pathology, this patient is an adequate patient for colonoscopy with EMR.  We will reach out to the patient to schedule her a clinic visit to discuss advanced endoscopic resection.  We will schedule her for a colonoscopy with EMR 60-minute session.  I will forward this to my team and have the records available for clinic and scanned into the chart   Aloha Finner, MD [facility] Advanced Endoscopy Office # [phone]

## 2024-11-30 ENCOUNTER — Encounter: Payer: Self-pay | Admitting: *Deleted

## 2024-12-09 ENCOUNTER — Encounter: Payer: Self-pay | Admitting: Gastroenterology

## 2024-12-09 ENCOUNTER — Ambulatory Visit: Admitting: Gastroenterology

## 2024-12-09 VITALS — BP 120/56 | HR 69 | Ht 62.0 in | Wt 135.2 lb

## 2024-12-09 DIAGNOSIS — Z860101 Personal history of adenomatous and serrated colon polyps: Secondary | ICD-10-CM

## 2024-12-09 DIAGNOSIS — D509 Iron deficiency anemia, unspecified: Secondary | ICD-10-CM | POA: Diagnosis not present

## 2024-12-09 DIAGNOSIS — D369 Benign neoplasm, unspecified site: Secondary | ICD-10-CM | POA: Insufficient documentation

## 2024-12-09 DIAGNOSIS — K635 Polyp of colon: Secondary | ICD-10-CM | POA: Insufficient documentation

## 2024-12-09 MED ORDER — NA SULFATE-K SULFATE-MG SULF 17.5-3.13-1.6 GM/177ML PO SOLN: 1.0000 | ORAL | 0 refills | Status: AC

## 2024-12-09 NOTE — Patient Instructions (Signed)
 We have sent the following medications to your pharmacy for you to pick up at your convenience: Suprep   We have placed order for labs. If we are not able to obtain your labs from [facility], you will need to return to our lab to have your drawn. No appointment needed.   You have been scheduled for a colonoscopy. Please follow written instructions given to you at your visit today.   If you use inhalers (even only as needed), please bring them with you on the day of your procedure.  DO NOT TAKE 7 DAYS PRIOR TO TEST- Trulicity (dulaglutide) Ozempic, Wegovy (semaglutide) Mounjaro, Zepbound (tirzepatide) Bydureon Bcise (exanatide extended release)  DO NOT TAKE 1 DAY PRIOR TO YOUR TEST Rybelsus (semaglutide) Adlyxin (lixisenatide) Victoza (liraglutide) Byetta (exanatide) ___________________________________________________________________________   Due to recent changes in healthcare laws, you may see the results of your imaging and laboratory studies on MyChart before your provider has had a chance to review them.  We understand that in some cases there may be results that are confusing or concerning to you. Not all laboratory results come back in the same time frame and the provider may be waiting for multiple results in order to interpret others.  Please give us  48 hours in order for your provider to thoroughly review all the results before contacting the office for clarification of your results.   _______________________________________________________  If your blood pressure at your visit was 140/90 or greater, please contact your primary care physician to follow up on this.  _______________________________________________________  If you are age 72 or older, your body mass index should be between 23-30. Your Body mass index is 24.74 kg/m. If this is out of the aforementioned range listed, please consider follow up with your Primary Care Provider.  If you are age 61 or  younger, your body mass index should be between 19-25. Your Body mass index is 24.74 kg/m. If this is out of the aformentioned range listed, please consider follow up with your Primary Care Provider.   ________________________________________________________  The [facility] providers would like to encourage you to use MYCHART to communicate with providers for non-urgent requests or questions.  Due to long hold times on the telephone, sending your provider a message by [facility] may be a faster and more efficient way to get a response.  Please allow 48 business hours for a response.  Please remember that this is for non-urgent requests.  _______________________________________________________  [facility] is using a team-based approach to care.  Your team is made up of your doctor and two to three APPS. Our APPS (Nurse Practitioners and Physician Assistants) work with your physician to ensure care continuity for you. They are fully qualified to address your health concerns and develop a treatment plan. They communicate directly with your gastroenterologist to care for you. Seeing the Advanced Practice Practitioners on your physician's team can help you by facilitating care more promptly, often allowing for earlier appointments, access to diagnostic testing, procedures, and other specialty referrals.   Thank you for choosing me and [facility].  Dr. Wilhelmenia

## 2024-12-09 NOTE — Progress Notes (Signed)
 "  GASTROENTEROLOGY OUTPATIENT CLINIC VISIT   Primary Care Provider Claudene Prentice DELENA Mickey., FNP [address] [place] [address] [phone]  Referring Provider Dr. Dianna  Patient Profile: Michelle Robertson is a 74 y.o. female with a pmh significant for diabetes, hypertension, arthritis, carotid artery stenosis, GERD, hiatal hernia, colon polyp (TVA of cecum still in situ).  The patient presents to the [facility] for an evaluation and management of problem(s) noted below:  Problem List 1. Cecal polyp   2. Tubulovillous adenoma   3. Iron deficiency anemia, unspecified iron deficiency anemia type    Discussed the use of AI scribe software for clinical note transcription with the patient, who gave verbal consent to proceed.  History of Present Illness This is the patient's first visit to the [facility] GI clinic.  Michelle Robertson is a 74 year old female who presents for evaluation of a cecal tubulovillous adenoma identified on colonoscopy for IDA evaluation and for consideration of advanced endoscopic resection.  This was her first colonoscopy.  She denies hematochezia or melena.  No changes in bowel habits have occurred, aside from rare constipation.  Recent laboratory studies at [facility] approximately one week ago confirmed persistent anemia (we do not have access to these records however).  She also reports elevated cholesterol. Her daughter was present during the visit and participated in the discussion regarding procedural options and recovery.  She is hopeful to not require surgery but wonders about just having surgery completed and the implications of that.   GI Review of Systems Positive as above Negative for dysphagia, odynophagia, abdominal pain, melena, hematochezia  Review of Systems General: Denies fevers/chills/weight loss unintentionally Cardiovascular: Denies chest pain Pulmonary: Denies shortness of breath Gastroenterological: See  HPI Genitourinary: Denies darkened urine Hematological: Denies easy bruising/bleeding Dermatological: Denies jaundice Psychological: Mood is stable  Medications Current Outpatient Medications  Medication Sig Dispense Refill   atorvastatin  (LIPITOR) 20 MG tablet Take 1 tablet (20 mg total) by mouth daily. Please call office to schedule an appt for further refills. Thank you 7 tablet 0   Blood Glucose Monitoring Suppl (ONE TOUCH ULTRA 2) w/Device KIT Use to test blood sugar three times daily. Dx: E11.9 100 each 11   CHLOROPHYLL PO Take 15 mLs by mouth daily. Vegan liquid     Cholecalciferol (VITAMIN D) 50 MCG (2000 UT) CAPS Take 1 capsule by mouth daily.     clonazePAM  (KLONOPIN ) 0.5 MG tablet Take 1 tablet (0.5 mg total) by mouth 2 (two) times daily as needed. for anxiety (Patient taking differently: Take 0.5 mg by mouth 2 (two) times daily as needed. for anxiety) 60 tablet 0   glucose blood (ONE TOUCH ULTRA TEST) test strip Use to test blood sugar three times daily. Dx:E11.9 100 each 11   Lancets (ONETOUCH ULTRASOFT) lancets Use to check blood sugar three times daily. Dx:E11.9 100 each 11   lisinopril  (ZESTRIL ) 10 MG tablet Take 1 tablet (10 mg total) by mouth daily. 90 tablet 3   meclizine  (ANTIVERT ) 12.5 MG tablet Take 1 tablet (12.5 mg total) by mouth 3 (three) times daily as needed for dizziness. 30 tablet 0   metFORMIN  (GLUCOPHAGE ) 500 MG tablet TAKE 1 TABLET BY MOUTH TWICE DAILY WITH A MEAL for diabetes (Patient taking differently: Take 500 mg by mouth 2 (two) times daily with a meal. TAKE 1 TABLET BY MOUTH TWICE DAILY WITH A MEAL for diabetes) 180 tablet 1   Na Sulfate-K Sulfate-Mg Sulfate concentrate (SUPREP BOWEL PREP KIT) 17.5-3.13-1.6 GM/177ML  SOLN Take 1 kit (354 mLs total) by mouth as directed. For colonoscopy prep 354 mL 0   ondansetron  (ZOFRAN -ODT) 4 MG disintegrating tablet Take 1 tablet (4 mg total) by mouth every 8 (eight) hours as needed for nausea or vomiting. 20 tablet 0    Polyethyl Glycol-Propyl Glycol (SYSTANE FREE OP) Apply to eye as needed.     polyethylene glycol powder (GLYCOLAX /MIRALAX ) 17 GM/SCOOP powder Take 17 g by mouth daily. Drink 17g (1 scoop) dissolved in water  per day. 255 g 0   No current facility-administered medications for this visit.    Allergies Allergies[1]  Histories Past Medical History:  Diagnosis Date   Anxiety    Arthritis    Carotid stenosis, asymptomatic, bilateral 12/2017   last ultrasound in epic 01-01-2018  bilateral ICA 40-59%   Chest pain of uncertain etiology 09/13/2022   Essential hypertension    Fall 11/06/2021   See ED provider note in Epic dated 11/09/21. Patient fell and hit her head causing a headache and scalp hematoma. 11/09/21 CT Head   & cervical spine normal (see Epic.)   Full dentures    GERD (gastroesophageal reflux disease)    Heart palpitations 2017   cardiology --- dr t. raford;  (10-18-2021  per intermittant palpitations) event monitor 03-20-2016 SR/ SB w/ occasional PAC and 12 lead ekg showed PVC   Hiatal hernia    History of chest pain    followed by cardiology-- dr t. raford;  (10-18-2021  oer pt no chest pain since 09-09-2021 & cardiology 09-14-2021 note in epic) CCT -- 12/ 2013 no coronary artery calcification/ atheroscloersis;  02/ 2019 nulcear stresst test, normal perfusion no ischemia, nuclear ef 55%;  02/ 2019  carotid US , bilateral ICA 40-59% stenosis;  last echo 11/ 2022 ef 60-65%, G1DD, trivial MR;  CTA 10/ 2022 normal   History of COVID-19 06/2021   per pt mild symptoms that resolved   History of ectopic pregnancy 1991   s/p surgery   History of panic attacks    IDA (iron deficiency anemia)    OSA (obstructive sleep apnea)    10-18-2021  per pt had study 2 months ago, waiting on cpap due to being on backorder   PAC (premature atrial contraction) 2017   occasional per event monitor 05/ 2017   Posterior vaginal wall prolapse    Presence of pessary    SOB (shortness of breath) on  exertion    per pt with stairs, house chores without sob, and long distance walking with occasional sob   Type 2 diabetes mellitus (HCC)    followed by pcp   (10-18-2021  pt stated check blood sugar 1-2 times daily,  fasting sugar-- 90--121)   Vertigo    Wears glasses    Past Surgical History:  Procedure Laterality Date   ANTERIOR AND POSTERIOR REPAIR WITH SACROSPINOUS FIXATION N/A 03/02/2022   Procedure: ANTERIOR REPAIR WITH SACROSPINOUS LIGAMENT FIXATION;  Surgeon: Marilynne Rosaline SAILOR, MD;  Location: [facility];  Service: Gynecology;  Laterality: N/A;   BUNIONECTOMY     colooscopy     CYSTOSCOPY N/A 03/02/2022   Procedure: CYSTOSCOPY;  Surgeon: Marilynne Rosaline SAILOR, MD;  Location: [facility];  Service: Gynecology;  Laterality: N/A;   ECTOPIC PREGNANCY SURGERY  1991   EXPLORATORY LAPAROTOMY     for bowel obstruction   RECTOCELE REPAIR N/A 10/23/2021   Procedure: POSTERIOR REPAIR (RECTOCELE);  Surgeon: Marilynne Rosaline SAILOR, MD;  Location: [facility];  Service:  Gynecology;  Laterality: N/A;   ROBOTIC ASSISTED LAPAROSCOPIC LYSIS OF ADHESION N/A 03/02/2022   Procedure: XI ROBOTIC ASSISTED laparoscopic lysis of adhesions;  Surgeon: Marilynne Rosaline SAILOR, MD;  Location: [facility];  Service: Gynecology;  Laterality: N/A;   TOE SURGERY     pins and debrement   TUBAL LIGATION Bilateral    yrs ago   Social History   Socioeconomic History   Marital status: Widowed    Spouse name: Not on file   Number of children: Not on file   Years of education: Not on file   Highest education level: Not on file  Occupational History   Not on file  Tobacco Use   Smoking status: Never   Smokeless tobacco: Never  Vaping Use   Vaping status: Never Used  Substance and Sexual Activity   Alcohol use: No   Drug use: Never   Sexual activity: Not Currently    Birth control/protection: Post-menopausal  Other Topics Concern   Not on file   Social History Narrative   Not on file   Social Drivers of Health   Tobacco Use: Low Risk (12/09/2024)   Patient History    Smoking Tobacco Use: Never    Smokeless Tobacco Use: Never    Passive Exposure: Not on file  Financial Resource Strain: Not on file  Food Insecurity: Not on file  Transportation Needs: Not on file  Physical Activity: Not on file  Stress: Not on file  Social Connections: Not on file  Intimate Partner Violence: Not on file  Depression (EYV7-0): Not on file  Alcohol Screen: Not on file  Housing: Not on file  Utilities: Not on file  Health Literacy: Not on file   Family History  Problem Relation Age of Onset   Diabetes Mother    Stroke Mother    Prostate cancer Father    Diabetes Maternal Aunt    Diabetes Paternal Grandmother    Bladder Cancer Neg Hx    Uterine cancer Neg Hx    Rectal cancer Neg Hx    Colon cancer Neg Hx    Esophageal cancer Neg Hx    Inflammatory bowel disease Neg Hx    Liver disease Neg Hx    Pancreatic cancer Neg Hx    Ulcerative colitis Neg Hx    I have reviewed her medical, social, and family history in detail and updated the electronic medical record as necessary.    PHYSICAL EXAMINATION  BP (!) 120/56   Pulse 69   Ht 5' 2 (1.575 m)   Wt 135 lb 4 oz (61.3 kg)   BMI 24.74 kg/m  Wt Readings from Last 3 Encounters:  12/09/24 135 lb 4 oz (61.3 kg)  03/02/24 130 lb (59 kg)  11/05/22 130 lb (59 kg)  GEN: NAD, appears stated age, doesn't appear chronically ill PSYCH: Cooperative, without pressured speech EYE: Conjunctivae pink, sclerae anicteric ENT: MMM CV: Nontachycardic RESP: No audible wheezing GI: NABS, soft, NT/ND, without rebound MSK/EXT: No significant lower extremity edema SKIN: No jaundice NEURO:  Alert & Oriented x 3, no focal deficits   REVIEW OF DATA  I reviewed the following data at the time of this encounter:  GI Procedures and Studies  10/25 [facility] clinic note for evaluation of iron deficiency  and GERD leading to EGD/colonoscopy.   Pathology Duodenum small bowel mucosa with no significant pathologic changes Stomach showed active H. pylori gastritis. Large intestine cecum biopsy tubulovillous adenoma   Colonoscopy from 09/29/2024 Perianal  and digital rectal exams were normal. Internal hemorrhoids found during retroflexion.  Hemorrhoids were large. A 15 mm polypoid lesion was found in the cecum.  The lesion was multilobulated.  No bleeding was present.  Biopsies were taken.  Rule out malignancy.  Laboratory Studies  Reviewed of those labs in epic We will try to get the results from [facility] for further review  Imaging Studies  No relevant studies to review   ASSESSMENT  Michelle Robertson is a 74 y.o. female.  The patient is seen today for evaluation and management of:  1. Cecal polyp   2. Tubulovillous adenoma   3. Iron deficiency anemia, unspecified iron deficiency anemia type    The patient is clinically and hemodynamically stable at this time.  Based upon the description and endoscopic pictures I do feel that it is reasonable to pursue an Advanced Polypectomy attempt of the polyp/lesion.  We discussed some of the techniques of advanced polypectomy which include Endoscopic Mucosal Resection, OVESCO Full-Thickness Resection, Endorotor Morcellation, and Tissue Ablation via Fulguration.  We also reviewed images of typical techniques as noted above.  The risks and benefits of endoscopic evaluation were discussed with the patient; these include but are not limited to the risk of perforation, infection, bleeding, missed lesions, lack of diagnosis, severe illness requiring hospitalization, as well as anesthesia and sedation related illnesses.  During attempts at advanced resection, the risks of bleeding and perforation/leak are increased as opposed to diagnostic and screening procedures, and that was discussed with the patient as well.   In addition, I explained that with the possible need  for piecemeal resection, subsequent short-interval endoscopic evaluation for follow up and potential retreatment of the lesion/area may be necessary.  I did offer, a referral to surgery in order for patient to have opportunity to discuss surgical management/intervention prior to finalizing decision for attempt at endoscopic removal, however, the patient deferred on this.  If, after attempt at removal of the polyp/lesion, it is found that the patient has a complication or that an invasive lesion or malignant lesion is found, or that the polyp/lesion continues to recur, the patient is aware and understands that surgery may still be indicated/required.  All patient questions were answered, to the best of my ability, and the patient agrees to the aforementioned plan of action with follow-up as indicated.   PLAN  Proceed with scheduling colonoscopy with attempted advanced resection of cecal lesion Preprocedure labs to be obtained Follow-up to be dictated based on results of colonoscopy If issues of iron deficiency/iron deficiency anemia persist, follow-up with primary gastroenterologist for further workup and consideration of video capsule endoscopy should be had   Orders Placed This Encounter  Procedures   Procedural/ Surgical Case Request: COLONOSCOPY, RESECTION, MUCOSAL LESION, GI TRACT, ENDOSCOPIC   CBC   Basic Metabolic Panel (BMET)   INR/PT   Ambulatory referral to Gastroenterology    New Prescriptions   NA SULFATE-K SULFATE-MG SULFATE CONCENTRATE (SUPREP BOWEL PREP KIT) 17.5-3.13-1.6 GM/177ML SOLN    Take 1 kit (354 mLs total) by mouth as directed. For colonoscopy prep   Modified Medications   No medications on file    Planned Follow Up No follow-ups on file.   Total Time in Face-to-Face and in Coordination of Care for patient including independent/personal interpretation/review of prior testing, medical history, examination, medication adjustment, communicating results with the  patient directly, and documentation within the EHR is 45 minutes.   Aloha Finner, MD [facility] Advanced Endoscopy Office # [phone]     [  1]  Allergies Allergen Reactions   Porcine (Pork) Protein-Containing Drug Products    "

## 2024-12-18 ENCOUNTER — Telehealth: Payer: Self-pay | Admitting: Gastroenterology

## 2024-12-18 NOTE — Telephone Encounter (Signed)
 Left message on machine to call back   02/08/25 next available with [facility] at [facility].

## 2024-12-18 NOTE — Telephone Encounter (Signed)
 Inbound call from patient stating that [place] be [place] 26 the of February and needs to have her procedure reschedule. Patient is requesting a call back. Please advise.

## 2024-12-22 NOTE — Telephone Encounter (Signed)
 The pt has been rescheduled to 02/10/25 at 8 am at [facility]. New instructions given, mailed and sent to My Chart

## 2025-02-10 ENCOUNTER — Encounter (HOSPITAL_COMMUNITY): Payer: Self-pay

## 2025-02-10 ENCOUNTER — Ambulatory Visit (HOSPITAL_COMMUNITY): Admit: 2025-02-10 | Admitting: Gastroenterology
